# Patient Record
Sex: Male | Born: 1945
Health system: Southern US, Community
[De-identification: ages and names within clinical notes are randomized; demographics above are authoritative.]

## PROBLEM LIST (undated history)

## (undated) DIAGNOSIS — I309 Acute pericarditis, unspecified: Secondary | ICD-10-CM

## (undated) DIAGNOSIS — I2510 Atherosclerotic heart disease of native coronary artery without angina pectoris: Secondary | ICD-10-CM

## (undated) DIAGNOSIS — E785 Hyperlipidemia, unspecified: Secondary | ICD-10-CM

## (undated) DIAGNOSIS — K635 Polyp of colon: Secondary | ICD-10-CM

## (undated) HISTORY — DX: Polyp of colon: K63.5

## (undated) HISTORY — DX: Hyperlipidemia, unspecified: E78.5

## (undated) HISTORY — DX: Gilbert syndrome: E80.4

## (undated) HISTORY — PX: OTHER SURGICAL HISTORY: SHX169

---

## 1982-10-29 HISTORY — PX: KNEE ARTHROSCOPY: SUR90

## 1997-10-29 HISTORY — PX: FLEXIBLE SIGMOIDOSCOPY: SHX1649

## 1999-03-24 ENCOUNTER — Encounter: Payer: Self-pay | Admitting: Internal Medicine

## 1999-03-24 ENCOUNTER — Ambulatory Visit (HOSPITAL_COMMUNITY): Admission: RE | Admit: 1999-03-24 | Discharge: 1999-03-24 | Payer: Self-pay | Admitting: Internal Medicine

## 2001-03-05 ENCOUNTER — Emergency Department (HOSPITAL_COMMUNITY): Admission: EM | Admit: 2001-03-05 | Discharge: 2001-03-05 | Payer: Self-pay | Admitting: Emergency Medicine

## 2001-12-22 ENCOUNTER — Encounter: Payer: Self-pay | Admitting: Internal Medicine

## 2001-12-22 ENCOUNTER — Encounter: Admission: RE | Admit: 2001-12-22 | Discharge: 2001-12-22 | Payer: Self-pay | Admitting: Internal Medicine

## 2002-10-29 HISTORY — PX: COLONOSCOPY W/ POLYPECTOMY: SHX1380

## 2003-10-13 ENCOUNTER — Encounter: Payer: Self-pay | Admitting: Internal Medicine

## 2004-09-01 ENCOUNTER — Ambulatory Visit: Payer: Self-pay | Admitting: Internal Medicine

## 2005-01-11 ENCOUNTER — Ambulatory Visit: Payer: Self-pay | Admitting: Internal Medicine

## 2005-04-09 ENCOUNTER — Ambulatory Visit: Payer: Self-pay | Admitting: Internal Medicine

## 2005-07-27 ENCOUNTER — Ambulatory Visit: Payer: Self-pay | Admitting: Internal Medicine

## 2006-04-05 ENCOUNTER — Ambulatory Visit: Payer: Self-pay | Admitting: Internal Medicine

## 2006-06-10 ENCOUNTER — Ambulatory Visit: Payer: Self-pay | Admitting: Internal Medicine

## 2006-08-01 ENCOUNTER — Ambulatory Visit: Payer: Self-pay | Admitting: Internal Medicine

## 2007-05-22 ENCOUNTER — Ambulatory Visit: Payer: Self-pay | Admitting: Internal Medicine

## 2007-08-31 ENCOUNTER — Encounter: Payer: Self-pay | Admitting: Internal Medicine

## 2007-09-01 ENCOUNTER — Ambulatory Visit: Payer: Self-pay | Admitting: Internal Medicine

## 2007-09-01 DIAGNOSIS — N400 Enlarged prostate without lower urinary tract symptoms: Secondary | ICD-10-CM | POA: Insufficient documentation

## 2007-09-01 DIAGNOSIS — E785 Hyperlipidemia, unspecified: Secondary | ICD-10-CM | POA: Insufficient documentation

## 2007-09-01 LAB — CONVERTED CEMR LAB
Cholesterol, target level: 200 mg/dL
HDL goal, serum: 40 mg/dL
LDL Goal: 160 mg/dL

## 2007-09-09 ENCOUNTER — Encounter (INDEPENDENT_AMBULATORY_CARE_PROVIDER_SITE_OTHER): Payer: Self-pay | Admitting: *Deleted

## 2007-09-09 LAB — CONVERTED CEMR LAB
ALT: 27 units/L (ref 0–53)
AST: 34 units/L (ref 0–37)
Albumin: 4.3 g/dL (ref 3.5–5.2)
Alkaline Phosphatase: 65 units/L (ref 39–117)
BUN: 18 mg/dL (ref 6–23)
Basophils Absolute: 0 10*3/uL (ref 0.0–0.1)
Basophils Relative: 0.9 % (ref 0.0–1.0)
Bilirubin, Direct: 0.2 mg/dL (ref 0.0–0.3)
CO2: 31 meq/L (ref 19–32)
Calcium: 9 mg/dL (ref 8.4–10.5)
Chloride: 104 meq/L (ref 96–112)
Cholesterol: 184 mg/dL (ref 0–200)
Creatinine, Ser: 1.1 mg/dL (ref 0.4–1.5)
Eosinophils Absolute: 0.1 10*3/uL (ref 0.0–0.6)
Eosinophils Relative: 2.5 % (ref 0.0–5.0)
GFR calc Af Amer: 88 mL/min
GFR calc non Af Amer: 73 mL/min
Glucose, Bld: 98 mg/dL (ref 70–99)
HCT: 41.8 % (ref 39.0–52.0)
HDL: 42.7 mg/dL (ref >39.0)
Hemoglobin: 14.4 g/dL (ref 13.0–17.0)
LDL Cholesterol: 123 mg/dL — ABNORMAL HIGH (ref 0–99)
Lymphocytes Relative: 24 % (ref 12.0–46.0)
MCHC: 34.5 g/dL (ref 30.0–36.0)
MCV: 93.9 fL (ref 78.0–100.0)
Monocytes Absolute: 0.5 10*3/uL (ref 0.2–0.7)
Monocytes Relative: 9.1 % (ref 3.0–11.0)
Neutro Abs: 3.6 10*3/uL (ref 1.4–7.7)
Neutrophils Relative %: 63.5 % (ref 43.0–77.0)
PSA: 0.51 ng/mL (ref 0.10–4.00)
Platelets: 242 10*3/uL (ref 150–400)
Potassium: 4.8 meq/L (ref 3.5–5.1)
RBC: 4.45 M/uL (ref 4.22–5.81)
RDW: 12.9 % (ref 11.5–14.6)
Sodium: 141 meq/L (ref 135–145)
TSH: 1.64 microintl units/mL (ref 0.35–5.50)
Total Bilirubin: 2 mg/dL — ABNORMAL HIGH (ref 0.3–1.2)
Total CHOL/HDL Ratio: 4.3
Total Protein: 7.3 g/dL (ref 6.0–8.3)
Triglycerides: 92 mg/dL (ref 0–149)
VLDL: 18 mg/dL (ref 0–40)
WBC: 5.5 10*3/uL (ref 4.5–10.5)

## 2007-09-15 ENCOUNTER — Ambulatory Visit: Payer: Self-pay | Admitting: Internal Medicine

## 2007-09-15 ENCOUNTER — Encounter (INDEPENDENT_AMBULATORY_CARE_PROVIDER_SITE_OTHER): Payer: Self-pay | Admitting: *Deleted

## 2007-12-30 ENCOUNTER — Ambulatory Visit: Payer: Self-pay | Admitting: Internal Medicine

## 2008-01-13 ENCOUNTER — Encounter (INDEPENDENT_AMBULATORY_CARE_PROVIDER_SITE_OTHER): Payer: Self-pay | Admitting: *Deleted

## 2008-01-13 LAB — CONVERTED CEMR LAB
ALT: 32 units/L (ref 0–53)
AST: 38 units/L — ABNORMAL HIGH (ref 0–37)
Albumin: 4 g/dL (ref 3.5–5.2)
Alkaline Phosphatase: 42 units/L (ref 39–117)
Bilirubin, Direct: 0.3 mg/dL (ref 0.0–0.3)
Cholesterol: 148 mg/dL (ref 0–200)
HDL: 44.5 mg/dL (ref >39.0)
LDL Cholesterol: 85 mg/dL (ref 0–99)
Total Bilirubin: 2.1 mg/dL — ABNORMAL HIGH (ref 0.3–1.2)
Total CHOL/HDL Ratio: 3.3
Total Protein: 6.8 g/dL (ref 6.0–8.3)
Triglycerides: 92 mg/dL (ref 0–149)
VLDL: 18 mg/dL (ref 0–40)

## 2008-01-19 ENCOUNTER — Telehealth (INDEPENDENT_AMBULATORY_CARE_PROVIDER_SITE_OTHER): Payer: Self-pay | Admitting: *Deleted

## 2008-06-08 ENCOUNTER — Telehealth (INDEPENDENT_AMBULATORY_CARE_PROVIDER_SITE_OTHER): Payer: Self-pay | Admitting: *Deleted

## 2008-07-14 ENCOUNTER — Ambulatory Visit: Payer: Self-pay | Admitting: Internal Medicine

## 2008-07-15 ENCOUNTER — Encounter: Payer: Self-pay | Admitting: Internal Medicine

## 2008-07-16 ENCOUNTER — Encounter (INDEPENDENT_AMBULATORY_CARE_PROVIDER_SITE_OTHER): Payer: Self-pay | Admitting: *Deleted

## 2008-07-16 LAB — CONVERTED CEMR LAB
ALT: 31 units/L (ref 0–53)
AST: 37 units/L (ref 0–37)
Albumin: 4.3 g/dL (ref 3.5–5.2)
Alkaline Phosphatase: 52 units/L (ref 39–117)
BUN: 16 mg/dL (ref 6–23)
Basophils Absolute: 0 10*3/uL (ref 0.0–0.1)
Basophils Relative: 0.5 % (ref 0.0–3.0)
Bilirubin, Direct: 0.3 mg/dL (ref 0.0–0.3)
CO2: 30 meq/L (ref 19–32)
Calcium: 9.2 mg/dL (ref 8.4–10.5)
Chloride: 108 meq/L (ref 96–112)
Cholesterol: 128 mg/dL (ref 0–200)
Creatinine, Ser: 1.2 mg/dL (ref 0.4–1.5)
Eosinophils Absolute: 0.1 10*3/uL (ref 0.0–0.7)
Eosinophils Relative: 2.5 % (ref 0.0–5.0)
GFR calc Af Amer: 79 mL/min
GFR calc non Af Amer: 66 mL/min
Glucose, Bld: 98 mg/dL (ref 70–99)
HCT: 41.8 % (ref 39.0–52.0)
HDL: 40.4 mg/dL (ref >39.0)
Hemoglobin: 14.2 g/dL (ref 13.0–17.0)
LDL Cholesterol: 69 mg/dL (ref 0–99)
Lymphocytes Relative: 25.2 % (ref 12.0–46.0)
MCHC: 34 g/dL (ref 30.0–36.0)
MCV: 93.8 fL (ref 78.0–100.0)
Monocytes Absolute: 0.6 10*3/uL (ref 0.1–1.0)
Monocytes Relative: 11.5 % (ref 3.0–12.0)
Neutro Abs: 3.4 10*3/uL (ref 1.4–7.7)
Neutrophils Relative %: 60.3 % (ref 43.0–77.0)
PSA: 0.41 ng/mL (ref 0.10–4.00)
Platelets: 246 10*3/uL (ref 150–400)
Potassium: 5.2 meq/L — ABNORMAL HIGH (ref 3.5–5.1)
RBC: 4.45 M/uL (ref 4.22–5.81)
RDW: 12.6 % (ref 11.5–14.6)
Sodium: 140 meq/L (ref 135–145)
TSH: 1.65 microintl units/mL (ref 0.35–5.50)
Total Bilirubin: 1.9 mg/dL — ABNORMAL HIGH (ref 0.3–1.2)
Total CHOL/HDL Ratio: 3.2
Total Protein: 7.1 g/dL (ref 6.0–8.3)
Triglycerides: 93 mg/dL (ref 0–149)
VLDL: 19 mg/dL (ref 0–40)
WBC: 5.5 10*3/uL (ref 4.5–10.5)

## 2008-07-26 ENCOUNTER — Ambulatory Visit: Payer: Self-pay | Admitting: Internal Medicine

## 2008-07-26 LAB — CONVERTED CEMR LAB
OCCULT 1: NEGATIVE
OCCULT 2: NEGATIVE
OCCULT 3: NEGATIVE

## 2008-07-27 ENCOUNTER — Encounter (INDEPENDENT_AMBULATORY_CARE_PROVIDER_SITE_OTHER): Payer: Self-pay | Admitting: *Deleted

## 2008-08-24 ENCOUNTER — Ambulatory Visit: Payer: Self-pay | Admitting: Internal Medicine

## 2008-08-24 ENCOUNTER — Telehealth (INDEPENDENT_AMBULATORY_CARE_PROVIDER_SITE_OTHER): Payer: Self-pay | Admitting: *Deleted

## 2008-09-09 ENCOUNTER — Ambulatory Visit: Payer: Self-pay | Admitting: Internal Medicine

## 2008-09-09 DIAGNOSIS — K635 Polyp of colon: Secondary | ICD-10-CM | POA: Insufficient documentation

## 2009-09-12 ENCOUNTER — Ambulatory Visit: Payer: Self-pay | Admitting: Internal Medicine

## 2009-09-12 DIAGNOSIS — G479 Sleep disorder, unspecified: Secondary | ICD-10-CM | POA: Insufficient documentation

## 2009-09-26 ENCOUNTER — Telehealth (INDEPENDENT_AMBULATORY_CARE_PROVIDER_SITE_OTHER): Payer: Self-pay | Admitting: *Deleted

## 2010-06-27 ENCOUNTER — Telehealth (INDEPENDENT_AMBULATORY_CARE_PROVIDER_SITE_OTHER): Payer: Self-pay | Admitting: *Deleted

## 2010-09-06 ENCOUNTER — Ambulatory Visit: Payer: Self-pay | Admitting: Internal Medicine

## 2010-09-10 LAB — CONVERTED CEMR LAB
ALT: 25 units/L (ref 0–53)
AST: 32 units/L (ref 0–37)
Albumin: 4 g/dL (ref 3.5–5.2)
Alkaline Phosphatase: 47 units/L (ref 39–117)
BUN: 17 mg/dL (ref 6–23)
Basophils Absolute: 0.1 10*3/uL (ref 0.0–0.1)
Basophils Relative: 0.9 % (ref 0.0–3.0)
Bilirubin, Direct: 0.2 mg/dL (ref 0.0–0.3)
CO2: 29 meq/L (ref 19–32)
Calcium: 9.1 mg/dL (ref 8.4–10.5)
Chloride: 105 meq/L (ref 96–112)
Cholesterol: 147 mg/dL (ref 0–200)
Creatinine, Ser: 1.1 mg/dL (ref 0.4–1.5)
Eosinophils Absolute: 0.2 10*3/uL (ref 0.0–0.7)
Eosinophils Relative: 2.9 % (ref 0.0–5.0)
GFR calc non Af Amer: 75.09 mL/min (ref >60)
Glucose, Bld: 93 mg/dL (ref 70–99)
HCT: 40.5 % (ref 39.0–52.0)
HDL: 44.5 mg/dL (ref >39.00)
Hemoglobin: 13.9 g/dL (ref 13.0–17.0)
LDL Cholesterol: 87 mg/dL (ref 0–99)
Lymphocytes Relative: 26.9 % (ref 12.0–46.0)
Lymphs Abs: 1.6 10*3/uL (ref 0.7–4.0)
MCHC: 34.4 g/dL (ref 30.0–36.0)
MCV: 94.1 fL (ref 78.0–100.0)
Monocytes Absolute: 0.5 10*3/uL (ref 0.1–1.0)
Monocytes Relative: 8.7 % (ref 3.0–12.0)
Neutro Abs: 3.7 10*3/uL (ref 1.4–7.7)
Neutrophils Relative %: 60.6 % (ref 43.0–77.0)
PSA: 0.56 ng/mL (ref 0.10–4.00)
Platelets: 236 10*3/uL (ref 150.0–400.0)
Potassium: 4.4 meq/L (ref 3.5–5.1)
RBC: 4.31 M/uL (ref 4.22–5.81)
RDW: 13.7 % (ref 11.5–14.6)
Sodium: 140 meq/L (ref 135–145)
TSH: 1.83 microintl units/mL (ref 0.35–5.50)
Total Bilirubin: 1.7 mg/dL — ABNORMAL HIGH (ref 0.3–1.2)
Total CHOL/HDL Ratio: 3
Total Protein: 6.2 g/dL (ref 6.0–8.3)
Triglycerides: 79 mg/dL (ref 0.0–149.0)
VLDL: 15.8 mg/dL (ref 0.0–40.0)
WBC: 6 10*3/uL (ref 4.5–10.5)

## 2010-09-12 ENCOUNTER — Encounter: Payer: Self-pay | Admitting: Internal Medicine

## 2010-09-12 ENCOUNTER — Ambulatory Visit: Payer: Self-pay | Admitting: Internal Medicine

## 2010-09-25 ENCOUNTER — Ambulatory Visit: Payer: Self-pay | Admitting: Internal Medicine

## 2010-09-25 LAB — CONVERTED CEMR LAB
OCCULT 1: NEGATIVE
OCCULT 2: NEGATIVE
OCCULT 3: NEGATIVE

## 2010-09-26 ENCOUNTER — Encounter (INDEPENDENT_AMBULATORY_CARE_PROVIDER_SITE_OTHER): Payer: Self-pay | Admitting: *Deleted

## 2010-11-26 LAB — CONVERTED CEMR LAB
ALT: 28 units/L (ref 0–53)
AST: 35 units/L (ref 0–37)
Albumin: 4.3 g/dL (ref 3.5–5.2)
Alkaline Phosphatase: 47 units/L (ref 39–117)
BUN: 14 mg/dL (ref 6–23)
Basophils Absolute: 0.1 10*3/uL (ref 0.0–0.1)
Basophils Relative: 0.9 % (ref 0.0–3.0)
Bilirubin, Direct: 0.3 mg/dL (ref 0.0–0.3)
CO2: 30 meq/L (ref 19–32)
Calcium: 8.9 mg/dL (ref 8.4–10.5)
Chloride: 101 meq/L (ref 96–112)
Cholesterol, target level: 200 mg/dL
Cholesterol: 160 mg/dL (ref 0–200)
Creatinine, Ser: 1.1 mg/dL (ref 0.4–1.5)
Eosinophils Absolute: 0.1 10*3/uL (ref 0.0–0.7)
Eosinophils Relative: 1.4 % (ref 0.0–5.0)
GFR calc non Af Amer: 72.18 mL/min (ref >60)
Glucose, Bld: 99 mg/dL (ref 70–99)
HCT: 43.7 % (ref 39.0–52.0)
HDL goal, serum: 40 mg/dL
HDL: 47.7 mg/dL (ref >39.00)
Hemoglobin: 14.6 g/dL (ref 13.0–17.0)
LDL Cholesterol: 101 mg/dL — ABNORMAL HIGH (ref 0–99)
LDL Goal: 100 mg/dL
Lymphocytes Relative: 24.5 % (ref 12.0–46.0)
Lymphs Abs: 1.4 10*3/uL (ref 0.7–4.0)
MCHC: 33.4 g/dL (ref 30.0–36.0)
MCV: 96.1 fL (ref 78.0–100.0)
Monocytes Absolute: 0.5 10*3/uL (ref 0.1–1.0)
Monocytes Relative: 9.1 % (ref 3.0–12.0)
Neutro Abs: 3.6 10*3/uL (ref 1.4–7.7)
Neutrophils Relative %: 64.1 % (ref 43.0–77.0)
PSA: 0.56 ng/mL (ref 0.10–4.00)
Platelets: 245 10*3/uL (ref 150.0–400.0)
Potassium: 4.7 meq/L (ref 3.5–5.1)
RBC: 4.54 M/uL (ref 4.22–5.81)
RDW: 12.6 % (ref 11.5–14.6)
Sodium: 138 meq/L (ref 135–145)
TSH: 1.59 microintl units/mL (ref 0.35–5.50)
Total Bilirubin: 2 mg/dL — ABNORMAL HIGH (ref 0.3–1.2)
Total CHOL/HDL Ratio: 3
Total Protein: 7.4 g/dL (ref 6.0–8.3)
Triglycerides: 59 mg/dL (ref 0.0–149.0)
VLDL: 11.8 mg/dL (ref 0.0–40.0)
WBC: 5.7 10*3/uL (ref 4.5–10.5)

## 2010-11-28 NOTE — Letter (Signed)
Summary: Results Follow up Letter  [facility] [place]  [address] [place], [place] [address]   Phone: [phone]  Fax: [phone]    09/26/2010 MRN: [id number]  Trevor [name] [address] [place], [place]  [address]  Dear [name],  The following are the results of your recent test(s):  Test         Result    Pap Smear:        Normal _____  Not Normal _____ Comments: ______________________________________________________ Cholesterol: LDL(Bad cholesterol):         Your goal is less than:         HDL (Good cholesterol):       Your goal is more than: Comments:  ______________________________________________________ Mammogram:        Normal _____  Not Normal _____ Comments:  ___________________________________________________________________ Hemoccult:        Normal __X___  Not normal _______ Comments:    _____________________________________________________________________ Other Tests:    We routinely do not discuss normal results over the telephone.  If you desire a copy of the results, or you have any questions about this information we can discuss them at your next office visit.   Sincerely,

## 2010-11-28 NOTE — Assessment & Plan Note (Signed)
Summary: cpx/cbs   Vital Signs:  Patient profile:   65 year old male Height:      67 inches (170.18 cm) Weight:      169.38 pounds (76.99 kg) BMI:     26.62 Temp:     97.6 degrees F (36.44 degrees C) oral Resp:     14 per minute BP sitting:   136 / 80  (left arm)  Vitals Entered By: Lucious Groves CMA (September 12, 2010 1:02 PM)  Is Patient Diabetic? No Pain Assessment Patient in pain? no      Comments Patient needs to discuss flu and zosta vacc./kb   History of Present Illness:         Trevor [name] is here for a physical; he has been awakening early for 6 months.  Hyperlipidemia Follow-Up        The patient reports fatigue, but denies muscle aches, GI upset, abdominal pain, flushing, itching, constipation, and diarrhea.  The patient denies the following symptoms: chest pain/pressure, exercise intolerance, dypsnea, palpitations, syncope, and pedal edema.  Compliance with medications (by patient report) has been near 100%.  Dietary compliance has been fair.  The patient reports exercising daily for 30-45 minutes.  Adjunctive measures currently used by the patient include ASA and fish oil supplements.    Lipid Management History:      Positive NCEP/ATP III risk factors include male age 31 years old or older.  Negative NCEP/ATP III risk factors include non-diabetic, no family history for ischemic heart disease, non-tobacco-user status, non-hypertensive, no ASHD (atherosclerotic heart disease), no prior stroke/TIA, no peripheral vascular disease, and no history of aortic aneurysm.     Current Medications (verified): 1)  Crestor 5 Mg  Tabs (Rosuvastatin Calcium) .... 1/2 By Mouth Once Daily 2)  Vit B 3)  Asa 81 Mg 4)  Vit C 5)  Multivitamin 6)  Fish Oil .... As Directed  Allergies (verified): 1)  ! Niacin 2)  Codeine  Past History:  Past Medical History: Hyperlipidemia: [place] Study LDL goal = < 160. NMR Lipoprofile 2004: LDL 156( 1799/ 916)HDL 39, TG 134. LDL goal= < 120, ideally  < 90. Gilbert's Syndrome ; Lymphocytic  Spongiotic Dermatitis; Actinic Keratoses, Dr Campbell Stall; Restless Leg Syndrome Colonic polyps, PMH  of , [facility] ER visit  2002 palpitations.  Past Surgical History: Arthroscopy  R knee Colon polypectomy @ age  72, Dr Kinnie Scales Actinic Keratoses, facial  removed X 2, Dr Danella Deis  Family History: Father: died  51  of CVA, alcohol & tobacco abuse Mother:  breast cancer  Siblings: bro :died  HIV; bro: prostate  cancer  @ 3; M aunt: breast cancer; MGM: breast cancer   Social History: no diet Married Alcohol use-no Regular exercise-yes Former Smoker:quit 1980  Review of Systems  The patient denies anorexia, fever, decreased hearing, hoarseness, prolonged cough, headaches, hemoptysis, abdominal pain, melena, hematochezia, severe indigestion/heartburn, hematuria, suspicious skin lesions, depression, unusual weight change, abnormal bleeding, enlarged lymph nodes, and angioedema.         Weight up 5 #.  Physical Exam  General:  well-nourished;alert,appropriate and cooperative throughout examination Head:  Normocephalic and atraumatic without obvious abnormalities. No apparent alopecia  Eyes:  No corneal or conjunctival inflammation noted. Perrla. Funduscopic exam benign, without hemorrhages, exudates or papilledema.  Ears:  External ear exam shows no significant lesions or deformities.  Otoscopic examination reveals clear canals, tympanic membranes are intact bilaterally without bulging, retraction, inflammation or discharge. Hearing is grossly normal bilaterally. Some wax bilaterally Nose:  External nasal examination shows no deformity or inflammation. Nasal mucosa are pink and moist without lesions or exudates. Mouth:  Oral mucosa and oropharynx without lesions or exudates.  Teeth in good repair. Neck:  No deformities, masses, or tenderness noted. Lungs:  Normal respiratory effort, chest expands symmetrically. Lungs are clear to auscultation, no  crackles or wheezes. Heart:  Normal rate and regular rhythm. S1 and S2 normal without gallop, murmur, click, rub.S 4 Abdomen:  Bowel sounds positive,abdomen soft and non-tender without masses, organomegaly or hernias noted. Rectal:  No external abnormalities noted. Normal sphincter tone. No rectal masses or tenderness. Genitalia:  Testes bilaterally descended without nodularity, tenderness or masses. No scrotal masses or lesions. No penis lesions or urethral discharge. Prostate:  no nodules, no asymmetry, no induration,  but  1.5 + enlarged.   Msk:  No deformity or scoliosis noted of thoracic or lumbar spine.   Pulses:  R and L carotid,radial,dorsalis pedis and posterior tibial pulses are full and equal bilaterally Extremities:  No clubbing, cyanosis, edema, or deformity noted with normal full range of motion of all joints.   Neurologic:  alert & oriented X3 and DTRs symmetrical and normal.   Skin:  Intact without suspicious lesions or rashes Cervical Nodes:  No lymphadenopathy noted Axillary Nodes:  No palpable lymphadenopathy Psych:  memory intact for recent and remote, normally interactive, and good eye contact.     Impression & Recommendations:  Problem # 1:  ROUTINE GENERAL MEDICAL EXAM@HEALTH  CARE FACL (ICD-V70.0)  Orders: EKG w/ Interpretation (93000)  Problem # 2:  HYPERLIPIDEMIA (ICD-272.4)  His updated medication list for this problem includes:    Crestor 5 Mg Tabs (Rosuvastatin calcium) .Marland Kitchen... Daily as directed  Problem # 3:  HYPERPLASIA PROSTATE UNS W/O UR OBST & OTH LUTS (ICD-600.90)  Problem # 4:  COLONIC POLYPS, HX OF (ICD-V12.72) as per Dr Kinnie Scales  Complete Medication List: 1)  Crestor 5 Mg Tabs (Rosuvastatin calcium) .... Daily as directed 2)  Vit B  3)  Asa 81 Mg  4)  Vit C  5)  Multivitamin  6)  Fish Oil  .... As directed 7)  Lorazepam 0.5 Mg Tabs (Lorazepam) .Marland Kitchen.. 1 by mouth every 8-12 hours as needed  Lipid Assessment/Plan:      Based on NCEP/ATP III, the  patient's risk factor category is "0-1 risk factors".  The patient's lipid goals are as follows: Total cholesterol goal is 200; LDL cholesterol goal is 100; HDL cholesterol goal is 40; Triglyceride goal is 150.  His LDL cholesterol goal has been met.    Patient Instructions: 1)  Crestor 5 mg 1/2 pill M, W,F & Sun. 2)  It is important that you exercise regularly at least 20 minutes 5 times a week. If you develop chest pain, have severe difficulty breathing, or feel very tired , stop exercising immediately and seek medical attention. 3)  Take an  81 mg coated Aspirin every day. Prescriptions: LORAZEPAM 0.5 MG TABS (LORAZEPAM) 1 by mouth every 8-12 hours as needed  #60 x 0   Entered by:   Shonna Chock CMA   Authorized by:   Marga Melnick MD   Signed by:   Marga Melnick MD on 09/12/2010   Method used:   Historical   RxID:   [id number] CRESTOR 5 MG  TABS (ROSUVASTATIN CALCIUM) daily as directed  #30 x 5   Entered and Authorized by:   Marga Melnick MD   Signed by:   Marga Melnick MD on 09/12/2010  Method used:   Print then Give to Patient   RxID:   [id number]    Orders Added: 1)  Est. Patient 40-64 years [99396] 2)  EKG w/ Interpretation [93000]

## 2010-11-28 NOTE — Progress Notes (Signed)
Summary: lab order  Phone Note Call from Patient   Caller: Patient Summary of Call: patient has cpx scheduled [id number] - lab scheduled [phone] - need lab order  Initial call taken by: Okey Regal Spring,  June 27, 2010 11:37 AM  Follow-up for Phone Call        TLB-Lipid Panel (80061-LIPID) TLB-BMP (Basic Metabolic Panel-BMET) (80048-METABOL) TLB-CBC Platelet - w/Differential (85025-CBCD) TLB-Hepatic/Liver Function Pnl (80076-HEPATIC) TLB-TSH (Thyroid Stimulating Hormone) (84443-TSH) TLB-PSA (Prostate Specific Antigen) (84153-PSA) Stool Cards and Udip v70.0/272.4/995.20 Follow-up by: Shonna Chock CMA,  June 27, 2010 11:58 AM  Additional Follow-up for Phone Call Additional follow up Details #1::        lab order added to appt .Marland KitchenOkey Regal Spring  June 27, 2010 2:28 PM

## 2011-02-12 ENCOUNTER — Other Ambulatory Visit: Payer: Self-pay | Admitting: Internal Medicine

## 2011-03-16 NOTE — Consult Note (Signed)
[place]  Patient:    Trevor [name], PERROTT                    MRN: [id number] Adm. Date:  [id number] Attending:  Osvaldo Human CC:         Hope M. Danella Deis, M.D.   Consultation Report  HISTORY OF PRESENT ILLNESS:  Gaetana Michaelis is a 65 year old Caucasian male who presents with tachycardia.  He has had this, the fifth episode, over the last three months, all in the setting of yard work.  He will work in the yard and seemingly become overheated and feel stressed.  This is associated with flushing and profuse diaphoresis but no nausea.  He also denies headache or diarrhea with this or chest pain.  He treats this with cold packs and rest and the symptoms resolve within three-quarters of an hour.  The symptoms can be initiated by 15 minutes of yard work.  His wife states that she has noted his pulse up to 150 or greater.  PAST MEDICAL HISTORY:  Arthroscopy.  MEDICATIONS:  He is on no medicines.  ALLERGIES:  CODEINE causes nausea.  SOCIAL HISTORY:  He does not smoke or drink.  [place] a six-month period[place] [facility] [place] 30 minutes at 3.9 miles per hour and 0 incline without cardiopulmonary symptoms.  He has not done this recently, as he has recently developed a rash that was possibly felt to be related to a [place] dermatitis [place] [employer].  FAMILY HISTORY:  Stroke in his father; his father also had hypertension.  A paternal aunt had stroke.  REVIEW OF SYSTEMS:  Negative stress EKG in 1998 done as a routine study at the time of a physical.  His wife questions whether he could be having panic attacks but he has had no episodes other than related to the work in the yard. He has had increased work stresses and has been traveling extensively by air. His rash was treated with a combination of a prescription steroid and Eucerin with improvement but unfortunately has continued to return and he has an appointment to see Dr.  Danella Deis.  PHYSICAL EXAMINATION:  GENERAL:  He is comfortable at this time.  VITAL SIGNS:  Pulse 67 with normal sinus rhythm on telemetry.  Respiratory rate is 17 and unlabored.  He is normotensive.  HEENT:  Fundal exam is unremarkable.  ______:  Unremarkable.  NECK:  He has no thyroid palpable abnormalities.  No carotid bruits are noted.  LYMPH NODES:  He has no lymphadenopathy about the head, neck, or axillae.  CHEST:  Clear to auscultation.  CARDIOVASCULAR:  An S4 is noted.  I cannot appreciate murmurs or particularly mitral valve prolapse.  ABDOMEN:  He has no organomegaly or masses.  GENITALIA/RECTUM:  Exams initially deferred.  EXTREMITIES:  All pulses are intact.  He has no edema.  NEUROLOGIC/PSYCHIATRIC:  Negative.  SKIN:  He has scattered small papules 0.5-1 cm in diameter of the trunk. There is significant deformity of the right great toe.  LABORATORY DATA:  EKG is normal.  ASSESSMENT:  The history suggests paroxysmal atrial tachycardia in the setting of yard work, which raises the possibility of electrolyte imbalance.  A CPK, MB, and troponin will be collected, along with a renal profile, magnesium, and calcium and a full thyroid profile.  At the time of his physical, there was slight decrease in hematocrit; followup CBC was normal. To rule out anemia  as playing a role here, CBC will be repeated.  For the rash, he will continue the topical agents until he is seen by Dr. Danella Deis within the next seven days.  If the enzymes and electrolytes are negative, he will return home.  He will be told not to work in the yard, as this is the only trigger for the event.  He will have a stress Cardiolite as an outpatient.  If this reveals no dysrhythmias at high-exercise level and no evidence of perfusion defect, then an event monitor could be employed while he is performing the yard work.  DD: 03/05/01 TD:  03/06/01 Job: [id number]

## 2011-04-21 ENCOUNTER — Encounter: Payer: Self-pay | Admitting: Internal Medicine

## 2011-04-24 ENCOUNTER — Ambulatory Visit (INDEPENDENT_AMBULATORY_CARE_PROVIDER_SITE_OTHER): Payer: PRIVATE HEALTH INSURANCE | Admitting: Internal Medicine

## 2011-04-24 ENCOUNTER — Encounter: Payer: Self-pay | Admitting: Internal Medicine

## 2011-04-24 VITALS — BP 118/80 | HR 71 | Temp 98.2°F | Wt 168.0 lb

## 2011-04-24 DIAGNOSIS — R05 Cough: Secondary | ICD-10-CM

## 2011-04-24 DIAGNOSIS — K219 Gastro-esophageal reflux disease without esophagitis: Secondary | ICD-10-CM

## 2011-04-24 DIAGNOSIS — R1310 Dysphagia, unspecified: Secondary | ICD-10-CM

## 2011-04-24 DIAGNOSIS — R059 Cough, unspecified: Secondary | ICD-10-CM

## 2011-04-24 MED ORDER — ALBUTEROL SULFATE HFA 108 (90 BASE) MCG/ACT IN AERS: 2.0000 | INHALATION_SPRAY | Freq: Four times a day (QID) | RESPIRATORY_TRACT | Status: DC | PRN

## 2011-04-24 MED ORDER — OMEPRAZOLE 20 MG PO CPDR: 20.0000 mg | DELAYED_RELEASE_CAPSULE | Freq: Two times a day (BID) | ORAL | Status: DC

## 2011-04-24 MED ORDER — FLUTICASONE-SALMETEROL 250-50 MCG/DOSE IN AEPB: 1.0000 | INHALATION_SPRAY | Freq: Two times a day (BID) | RESPIRATORY_TRACT | Status: DC

## 2011-04-24 NOTE — Patient Instructions (Signed)
The triggers for dyspepsia or "heart burn"  include stress; the "aspirin family" ; alcohol; peppermint; and caffeine (coffee, tea, cola, and chocolate). The aspirin family would include aspirin and the nonsteroidal agents such as ibuprofen &  Naproxen. Tylenol would not cause reflux. If having dyspepsia ; food & dink should be avoided for @ least 2 hors before going to bed.  GI referral if dysphagia persists. PFTs if cough persists.

## 2011-04-24 NOTE — Progress Notes (Signed)
  Subjective:    Patient ID: Trevor [name], male    DOB: 08-15-1946, 65 y.o.   MRN: [id number]  HPI Respiratory tract infection Onset/symptoms:2 weeks ago as dry cough Exposures (illness/environmental/extrinsic):no Progression of symptoms:worsening Treatments/response:seen for progression @ UC; Rx: Prednisone, decongestant  & Zpack with response Present symptoms:cough recurred 6/25; actually he has had a cough when working  outside for past year Fever/chills/sweats:no Frontal headache:no Facial pain:no Nasal purulence:no Sore throat:yes Dental pain:no Lymphadenopathy:no Wheezing;no;shortness of breath:after cough Sputum/hemoptysis:no Pleuritic pain:no Associated extrinsic/allergic symptoms:itchy eyes/ sneezing:no Past medical history: Seasonal allergies:no;asthma:no Meds: no ACE-I GERD: yes ; intermittent food dysphagia, "2x/week" Smoking history:quit 1980 FH: bro  childhood asthma; mother S/P esophageal dilation           Review of Systems no melena, BRRB, unexplained weight loss     Objective:   Physical Exam General appearance is of good health and nourishment; no acute distress or increased work of breathing is present.  No  lymphadenopathy about the head, neck, or axilla noted.   Eyes: No conjunctival inflammation or lid edema is present. There is no scleral icterus.  Ears:  External ear exam shows no significant lesions or deformities.  Otoscopic examination reveals some wax. L TM  intact  without bulging, retraction, inflammation or discharge.  Nose:  External nasal examination shows no deformity or inflammation. Nasal mucosa are pink and moist without lesions or exudates. No septal dislocation or dislocation.No obstruction to airflow.   Oral exam: Dental hygiene is good; lips and gums are healthy appearing.There is no oropharyngeal erythema or exudate noted.   Neck:  No deformities, thyromegaly, masses, or tenderness noted.     Heart:  Normal rate and  regular rhythm. S1 and S2 normal without gallop, murmur, click, rub or other extra sounds.   Lungs:Chest clear to auscultation; no wheezes, rhonchi,rales ,or rubs present.No increased work of breathing.  Abdomen: bowel sounds normal, soft and non-tender without masses, organomegaly or hernias noted.  No guarding or rebound    Extremities:  No cyanosis, edema, or clubbing  noted    Skin: Warm & dry w/o jaundice or tenting.          Assessment & Plan:  #1 bronchitis, acute, resolved #2 cough, chronic recurrent X 12 months . R/O EIB &/or GERD.With the dysphagia the latter is more likely. Plan: see Orders

## 2011-05-14 ENCOUNTER — Telehealth: Payer: Self-pay | Admitting: Internal Medicine

## 2011-05-14 DIAGNOSIS — R059 Cough, unspecified: Secondary | ICD-10-CM

## 2011-05-14 DIAGNOSIS — R05 Cough: Secondary | ICD-10-CM

## 2011-05-14 NOTE — Telephone Encounter (Signed)
Pt called still has cough non productive and would like to know if there is anything else he cough do.

## 2011-05-15 NOTE — Telephone Encounter (Signed)
Per Dr. Alwyn Ren ok for tessalon 200 mg every 8 hours prn #21 along w/ order for chest xray and referral to pulmonary for PFT's  Left for pt to return call.

## 2011-05-15 NOTE — Progress Notes (Signed)
Addended byMarga Melnick F on: 05/15/2011 10:23 AM   Modules accepted: Orders

## 2011-05-16 NOTE — Telephone Encounter (Signed)
The studies would rule out any other active lung process and also assess for the presence of reactive airways disease or asthma as cause of this protracted cough. He may pick up a sample of Symbicort 160 to be used 1-2 puffs every 12 hours. He should gargle and spit after use.

## 2011-05-16 NOTE — Telephone Encounter (Signed)
Spoke w/ pt says he really feels like he needs an inhaler instead of cough med informed pt that he should Dr. Janae Bridgeman want to wait on results before prescribing inhaler but will forward information. Says he has more of wheezing feeling more than cough also let him this is why he will be going for orders placed. Please advise do you wish to prescribe inhaler.

## 2011-05-16 NOTE — Telephone Encounter (Signed)
Left msg on voicemail to return call.

## 2011-05-17 NOTE — Telephone Encounter (Signed)
Spoke w/ pt informed sample placed up front for pick

## 2011-05-21 ENCOUNTER — Encounter: Payer: Self-pay | Admitting: Internal Medicine

## 2011-05-21 ENCOUNTER — Ambulatory Visit (INDEPENDENT_AMBULATORY_CARE_PROVIDER_SITE_OTHER): Payer: PRIVATE HEALTH INSURANCE | Admitting: Internal Medicine

## 2011-05-21 ENCOUNTER — Ambulatory Visit (INDEPENDENT_AMBULATORY_CARE_PROVIDER_SITE_OTHER)
Admission: RE | Admit: 2011-05-21 | Discharge: 2011-05-21 | Disposition: A | Discharge status: Home / Self Care | Payer: PRIVATE HEALTH INSURANCE | Source: Ambulatory Visit | Attending: Internal Medicine | Admitting: Internal Medicine

## 2011-05-21 DIAGNOSIS — R05 Cough: Secondary | ICD-10-CM

## 2011-05-21 DIAGNOSIS — R059 Cough, unspecified: Secondary | ICD-10-CM

## 2011-05-21 DIAGNOSIS — R0989 Other specified symptoms and signs involving the circulatory and respiratory systems: Secondary | ICD-10-CM

## 2011-05-21 DIAGNOSIS — R0609 Other forms of dyspnea: Secondary | ICD-10-CM

## 2011-05-21 LAB — PULMONARY FUNCTION TEST

## 2011-05-21 NOTE — Progress Notes (Signed)
PFT done today. 

## 2011-08-13 ENCOUNTER — Ambulatory Visit (INDEPENDENT_AMBULATORY_CARE_PROVIDER_SITE_OTHER): Payer: PRIVATE HEALTH INSURANCE | Admitting: Internal Medicine

## 2011-08-13 ENCOUNTER — Encounter: Payer: Self-pay | Admitting: Internal Medicine

## 2011-08-13 VITALS — BP 126/80 | HR 65 | Temp 98.4°F | Resp 12 | Ht 67.25 in | Wt 166.8 lb

## 2011-08-13 DIAGNOSIS — E785 Hyperlipidemia, unspecified: Secondary | ICD-10-CM

## 2011-08-13 DIAGNOSIS — Z0000 Encounter for general adult medical examination without abnormal findings: Secondary | ICD-10-CM

## 2011-08-13 LAB — CBC WITH DIFFERENTIAL/PLATELET
Basophils Absolute: 0 10*3/uL (ref 0.0–0.1)
Eosinophils Relative: 0.9 % (ref 0.0–5.0)
Hemoglobin: 13.7 g/dL (ref 13.0–17.0)
Lymphocytes Relative: 29.7 % (ref 12.0–46.0)
Monocytes Relative: 7.9 % (ref 3.0–12.0)
Neutro Abs: 3.1 10*3/uL (ref 1.4–7.7)
RBC: 4.35 Mil/uL (ref 4.22–5.81)
RDW: 13.8 % (ref 11.5–14.6)
WBC: 5.2 10*3/uL (ref 4.5–10.5)

## 2011-08-13 LAB — BASIC METABOLIC PANEL
Calcium: 8.7 mg/dL (ref 8.4–10.5)
GFR: 79.16 mL/min (ref >60.00)
Glucose, Bld: 84 mg/dL (ref 70–99)
Potassium: 4.3 mEq/L (ref 3.5–5.1)
Sodium: 139 mEq/L (ref 135–145)

## 2011-08-13 LAB — LIPID PANEL
HDL: 50 mg/dL (ref >39.00)
LDL Cholesterol: 118 mg/dL — ABNORMAL HIGH (ref 0–99)
VLDL: 16 mg/dL (ref 0.0–40.0)

## 2011-08-13 LAB — HEPATIC FUNCTION PANEL
ALT: 22 U/L (ref 0–53)
AST: 29 U/L (ref 0–37)
Total Bilirubin: 1.9 mg/dL — ABNORMAL HIGH (ref 0.3–1.2)
Total Protein: 6.9 g/dL (ref 6.0–8.3)

## 2011-08-13 LAB — PSA: PSA: 0.61 ng/mL (ref 0.10–4.00)

## 2011-08-13 NOTE — Assessment & Plan Note (Signed)
On Crestor 5 mg 1/2 pill M, W, & Fri

## 2011-08-13 NOTE — Patient Instructions (Addendum)
Preventive Health Care: Exercise at least 30-45 minutes a day,  3-4 days a week.  Eat a low-fat diet with lots of fruits and vegetables, up to 7-9 servings per day. Consume less than 40 grams of sugar per day from foods & drinks with High Fructose Corn Sugar as #2,3 or # 4 on label.  Should wax build up occur, please put 2-3 drops of mineral oil in the ear at night and cover the canal with a  cotton ball.In the morning fill the canal with hydrogen peroxide & leave  for 10-15 minutes.Following this shower and use the thinnest washrag available to wick out the wax.  Eat a low-fat diet with lots of fruits and vegetables, up to 7-9 servings per day. Avoid obesity; your goal is waist measurement < 40 inches.Consume less than 40 grams of sugar per day from foods & drinks with High Fructose Corn Sugar as #1,2,3 or # 4 on label. Follow the low carb nutrition program in [employer] as closely as possible to prevent Diabetes progression & complications. White carbohydrates (potatoes, rice, bread, and pasta) have a high spike of sugar and a high load of sugar. For example a  baked potato has a cup of sugar and a  french fry  2 teaspoons of sugar. Yams, wild  rice, whole grained bread &  wheat pasta have been much lower spike and load of  sugar. Portions should be the size of a deck of cards or your palm.

## 2011-08-13 NOTE — Progress Notes (Signed)
Subjective:    Patient ID: Trevor [name], male    DOB: 24-Mar-1946, 65 y.o.   MRN: [id number]  HPI  Trevor [name]  is here for a physical; he has no acute issues but is seeking a defined nutritional plan.      Review of Systems Patient reports no  vision/ hearing changes,anorexia, weight change, fever ,adenopathy, persistant / recurrent hoarseness, swallowing issues, chest pain,palpitations, edema,persistant / recurrent cough, hemoptysis, dyspnea(rest, exertional, paroxysmal nocturnal), gastrointestinal  bleeding (melena, rectal bleeding), abdominal pain, excessive heart burn, GU symptoms( dysuria, hematuria, pyuria, voiding/incontinence  issues) syncope, focal weakness,significant  memory loss,numbness & tingling, skin/hair/nail changes,depression, anxiety,  abnormal bruising/bleeding,or  musculoskeletal symptoms/signs.  Exercise 4X/ week w/o symptoms.     Objective:   Physical Exam Gen.: Healthy and well-nourished in appearance. Alert, appropriate and cooperative throughout exam. Head: Normocephalic without obvious abnormalities;  no alopecia  Eyes: No corneal or conjunctival inflammation noted. Pupils equal round reactive to light and accommodation. Fundal exam is benign without hemorrhages, exudate, papilledema. Extraocular motion intact. Vision slightly blurred OS. Ears: External  ear exam reveals no significant lesions or deformities. Canals:wax bilaterally .TMs normal. Hearing is grossly normal bilaterally. Nose: External nasal exam reveals no deformity or inflammation. Nasal mucosa are pink and moist. No lesions or exudates noted.  Mouth: Oral mucosa and oropharynx reveal no lesions or exudates. Teeth in good repair. Neck: No deformities, masses, or tenderness noted. Range of motion essentially normal. Thyroid normal. Lungs: Normal respiratory effort; chest expands symmetrically. Lungs are clear to auscultation without rales, wheezes, or increased work of breathing. Heart: Normal  rate and rhythm. Normal S1 and S2. No gallop, click, or rub. S 4 w/o  murmur. Abdomen: Bowel sounds normal; abdomen soft and nontender. No masses, organomegaly or hernias noted. Genitalia/ DRE:    .                                                                                   Musculoskeletal/extremities: No deformity or scoliosis noted of  the thoracic or lumbar spine. No clubbing, cyanosis, edema, or deformity noted. Range of motion  normal .Tone & strength  normal.Joints normal. Nail health  good. Vascular: Carotid, radial artery, dorsalis pedis and  posterior tibial pulses are full and equal. No bruits present. Neurologic: Alert and oriented x3. Deep tendon reflexes symmetrical and normal.          Skin: Intact without suspicious lesions or rashes. Actinic changes Lymph: No cervical, axillary, or inguinal lymphadenopathy present. Psych: Mood and affect are normal. Normally interactive                                                                                        Assessment & Plan:  #1 comprehensive physical exam; no acute findings #2 see Problem List with Assessments & Recommendations Plan: see Orders

## 2011-08-27 ENCOUNTER — Telehealth: Payer: Self-pay | Admitting: Internal Medicine

## 2011-08-27 MED ORDER — LORAZEPAM 0.5 MG PO TABS: 0.5000 mg | ORAL_TABLET | Freq: Three times a day (TID) | ORAL | Status: DC | PRN

## 2011-08-27 MED ORDER — ROSUVASTATIN CALCIUM 5 MG PO TABS: ORAL_TABLET | ORAL | Status: DC

## 2011-08-27 NOTE — Telephone Encounter (Signed)
RX's sent, Dr.Hopper did not indicated patient should stop med, therefore patient is to continue. Left message on voicemail for patient to continue taking medication

## 2011-08-27 NOTE — Telephone Encounter (Signed)
Patient is on crestor m-w- f should he continue taking it based on lab result - 2) patient needs refill for  - lorazepam -  [facility]

## 2012-04-08 ENCOUNTER — Telehealth: Payer: Self-pay | Admitting: Internal Medicine

## 2012-04-08 MED ORDER — OMEPRAZOLE 20 MG PO CPDR: 20.0000 mg | DELAYED_RELEASE_CAPSULE | Freq: Two times a day (BID) | ORAL | Status: DC

## 2012-04-08 NOTE — Telephone Encounter (Signed)
Rx sent 

## 2012-04-08 NOTE — Telephone Encounter (Signed)
Refill: Omeprazole 20mg  capsules. Take 1 capsule by mouth twice daily. Qty 60. Last fill 02-06-12

## 2012-06-07 ENCOUNTER — Other Ambulatory Visit: Payer: Self-pay | Admitting: Internal Medicine

## 2012-06-09 ENCOUNTER — Telehealth: Payer: Self-pay | Admitting: Internal Medicine

## 2012-06-09 NOTE — Telephone Encounter (Signed)
Dr.Hopper please advise 

## 2012-06-09 NOTE — Telephone Encounter (Signed)
Ophthalmology evaluation indicated every 12-24 months rather than evaluation by an optician. This would rule out early cataracts or glaucoma. Dr. Hazle Quant is a good ophthalmologist, but you should see one on your plan

## 2012-06-09 NOTE — Telephone Encounter (Signed)
Left message on VM with Dr.Hopper's recommendation, patient to call back if he has questions

## 2012-06-09 NOTE — Telephone Encounter (Signed)
pt has made appt for CPE but stated at last CPE 10.15.12 hopp stated he needed Eye Exam.  Pt would like specifics as to what Alfonse Flavors wanted him to have done and a recommendation as to whom he should see CB# [phone]

## 2012-08-27 ENCOUNTER — Encounter: Payer: Self-pay | Admitting: Internal Medicine

## 2012-08-27 ENCOUNTER — Ambulatory Visit (INDEPENDENT_AMBULATORY_CARE_PROVIDER_SITE_OTHER): Payer: PRIVATE HEALTH INSURANCE | Admitting: Internal Medicine

## 2012-08-27 VITALS — BP 110/62 | HR 63 | Temp 97.5°F | Resp 12 | Ht 67.0 in | Wt 153.8 lb

## 2012-08-27 DIAGNOSIS — Z0000 Encounter for general adult medical examination without abnormal findings: Secondary | ICD-10-CM

## 2012-08-27 DIAGNOSIS — E785 Hyperlipidemia, unspecified: Secondary | ICD-10-CM

## 2012-08-27 LAB — HEPATIC FUNCTION PANEL
ALT: 24 U/L (ref 0–53)
AST: 29 U/L (ref 0–37)
Albumin: 4 g/dL (ref 3.5–5.2)
Alkaline Phosphatase: 49 U/L (ref 39–117)

## 2012-08-27 LAB — CBC WITH DIFFERENTIAL/PLATELET
Basophils Absolute: 0 10*3/uL (ref 0.0–0.1)
Basophils Relative: 0.6 % (ref 0.0–3.0)
Eosinophils Absolute: 0.1 10*3/uL (ref 0.0–0.7)
Lymphocytes Relative: 32.5 % (ref 12.0–46.0)
MCHC: 33.1 g/dL (ref 30.0–36.0)
Monocytes Relative: 8.6 % (ref 3.0–12.0)
Neutrophils Relative %: 56 % (ref 43.0–77.0)
RBC: 4.46 Mil/uL (ref 4.22–5.81)
RDW: 13.5 % (ref 11.5–14.6)

## 2012-08-27 LAB — BASIC METABOLIC PANEL
CO2: 30 mEq/L (ref 19–32)
Calcium: 9.1 mg/dL (ref 8.4–10.5)
Creatinine, Ser: 1 mg/dL (ref 0.4–1.5)
GFR: 78.9 mL/min (ref >60.00)
Sodium: 137 mEq/L (ref 135–145)

## 2012-08-27 LAB — LIPID PANEL: VLDL: 12.4 mg/dL (ref 0.0–40.0)

## 2012-08-27 LAB — TSH: TSH: 1.97 u[IU]/mL (ref 0.35–5.50)

## 2012-08-27 LAB — PSA: PSA: 0.64 ng/mL (ref 0.10–4.00)

## 2012-08-27 MED ORDER — OMEPRAZOLE 20 MG PO CPDR: DELAYED_RELEASE_CAPSULE | ORAL | Status: DC

## 2012-08-27 MED ORDER — ALBUTEROL SULFATE HFA 108 (90 BASE) MCG/ACT IN AERS: INHALATION_SPRAY | RESPIRATORY_TRACT | Status: DC

## 2012-08-27 NOTE — Patient Instructions (Addendum)
The triggers for dyspepsia or reflux include stress; the "aspirin family" ; alcohol; peppermint; and caffeine (coffee, tea, cola, and chocolate). The aspirin family would include aspirin and the nonsteroidal agents such as ibuprofen &  Naproxen. Tylenol would not cause reflux. If having dyspepsia ; food & drink should be avoided for @ least 2 hours before going to bed.  Exercise at least 30-45 minutes a day,  3-4 days a week.  Eat a low-fat diet with lots of fruits and vegetables, up to 7-9 servings per day.  Consume less than 40 grams of sugar per day from foods & drinks with High Fructose Corn Sugar as #1,2,3 or # 4 on label. If you activate My Chart; the results can be released to you as soon as they populate from the lab. If you choose not to use this program; the labs have to be reviewed, copied & mailed   causing a delay in getting the results to you.

## 2012-08-27 NOTE — Progress Notes (Signed)
  Subjective:    Patient ID: Trevor [name], male    DOB: 06-05-1946, 66 y.o.   MRN: [id number]  HPI  Trevor [name] is here for a physical; he denies acute issues .      Review of Systems  He has run/walk 6 races varying in distance from 2-5 miles in the last year. He denies associated chest pain, palpitations, claudication, or edema. He has noted some dyspnea at the initial 1/4 mile mark; this resolves he is able to complete the race without difficulty. He has no history of asthma; his brother did have asthma.  He is on a heart healthy diet.       Objective:   Physical Exam Gen.: Healthy and well-nourished in appearance. Alert, appropriate and cooperative throughout exam. Head: Normocephalic without obvious abnormalities  Eyes: No corneal or conjunctival inflammation noted. Pupils equal round reactive to light and accommodation. Fundal exam is benign without hemorrhages, exudate, papilledema. Extraocular motion intact. Vision grossly decreased. Ptosis OS > OD Ears: External  ear exam reveals no significant lesions or deformities. Canals clear .TMs normal. Hearing is grossly normal bilaterally. Nose: External nasal exam reveals no deformity or inflammation. Nasal mucosa are pink and moist. No lesions or exudates noted.   Mouth: Oral mucosa and oropharynx reveal no lesions or exudates. Teeth in good repair. Neck: No deformities, masses, or tenderness noted. Range of motion & Thyroid normal Lungs: Normal respiratory effort; chest expands symmetrically. Lungs are clear to auscultation without rales, wheezes, or increased work of breathing. Heart: Slow rate and regular rhythm. Normal S1 and S2. No gallop, click, or rub. S4 with slurring at  LSB . Abdomen: Bowel sounds normal; abdomen soft and nontender. No masses, organomegaly or hernias noted. Genitalia/ DRE: Genitalia normal except for R hydrocoele. Prostate is asymmeyrically enlarged, R lobe > L,w/o nodularity or induration.    Musculoskeletal/extremities:  No clubbing, cyanosis, edema, or deformity noted. Range of motion  normal .Tone & strength  normal.Joints normal. Nail health  good. Vascular: Carotid, radial artery, dorsalis pedis and  posterior tibial pulses are full and equal. No bruits present. Neurologic: Alert and oriented x3. Deep tendon reflexes symmetrical and normal.          Skin: Intact without suspicious lesions or rashes. Lymph: No cervical, axillary, or inguinal lymphadenopathy present. Psych: Mood and affect are normal. Normally interactive                                                                                        Assessment & Plan:  #1 comprehensive physical exam; no acute findings #2 exercise-induced bronchospasm suggested by history; trial of bronchodilator 15-30 minutes pre-race Plan: see Orders

## 2012-08-31 ENCOUNTER — Encounter: Payer: Self-pay | Admitting: Internal Medicine

## 2012-10-29 HISTORY — PX: COLONOSCOPY: SHX174

## 2013-03-13 ENCOUNTER — Encounter: Payer: Self-pay | Admitting: Lab

## 2013-03-16 ENCOUNTER — Other Ambulatory Visit: Payer: PRIVATE HEALTH INSURANCE

## 2013-03-20 ENCOUNTER — Other Ambulatory Visit (INDEPENDENT_AMBULATORY_CARE_PROVIDER_SITE_OTHER): Payer: PRIVATE HEALTH INSURANCE

## 2013-03-20 DIAGNOSIS — E785 Hyperlipidemia, unspecified: Secondary | ICD-10-CM

## 2013-03-20 LAB — LIPID PANEL
Cholesterol: 190 mg/dL (ref 0–200)
HDL: 43.7 mg/dL (ref >39.00)

## 2013-03-22 ENCOUNTER — Encounter: Payer: Self-pay | Admitting: Internal Medicine

## 2013-03-24 ENCOUNTER — Encounter: Payer: Self-pay | Admitting: Internal Medicine

## 2013-06-03 ENCOUNTER — Other Ambulatory Visit: Payer: Self-pay

## 2013-06-04 ENCOUNTER — Telehealth: Payer: Self-pay | Admitting: Internal Medicine

## 2013-06-04 DIAGNOSIS — H6123 Impacted cerumen, bilateral: Secondary | ICD-10-CM

## 2013-06-04 NOTE — Telephone Encounter (Signed)
Patient called stating his ears are clogged again. He tried to use mineral oil/peroxide as Dr. Alwyn Ren has instructed, but he states it only made it worse. He would like referral to ENT.

## 2013-06-05 NOTE — Telephone Encounter (Signed)
OK ; Dx= impacted cerumen

## 2013-06-05 NOTE — Telephone Encounter (Signed)
Entered referral and Left message for the patient.

## 2013-06-05 NOTE — Telephone Encounter (Signed)
Is it ok to refer or should you see him first? Last OV 07/2012

## 2013-08-27 ENCOUNTER — Telehealth: Payer: Self-pay

## 2013-08-27 NOTE — Telephone Encounter (Signed)
Medication List and allergies: reviewed and updated  90 day supply/mail order: na Local prescriptions: [facility] [address]  Immunizations due:  Offered flu vaccine  A/P:  Welcome to Medicare  No changes to FH or PSH CCS--scheduled for 09/10/2013--Dr Medoff PSA WNL--07/2011  To Discuss with Provider: Ran a half marathon recently---[place] for him!!!

## 2013-08-29 LAB — HM COLONOSCOPY

## 2013-08-31 ENCOUNTER — Ambulatory Visit (INDEPENDENT_AMBULATORY_CARE_PROVIDER_SITE_OTHER): Payer: Medicare Other | Admitting: Internal Medicine

## 2013-08-31 ENCOUNTER — Encounter: Payer: Self-pay | Admitting: Internal Medicine

## 2013-08-31 VITALS — BP 115/57 | HR 50 | Temp 97.9°F | Ht 67.25 in

## 2013-08-31 DIAGNOSIS — Z86010 Personal history of colonic polyps: Secondary | ICD-10-CM

## 2013-08-31 DIAGNOSIS — N429 Disorder of prostate, unspecified: Secondary | ICD-10-CM

## 2013-08-31 DIAGNOSIS — E559 Vitamin D deficiency, unspecified: Secondary | ICD-10-CM

## 2013-08-31 DIAGNOSIS — N433 Hydrocele, unspecified: Secondary | ICD-10-CM | POA: Insufficient documentation

## 2013-08-31 DIAGNOSIS — Z8042 Family history of malignant neoplasm of prostate: Secondary | ICD-10-CM

## 2013-08-31 DIAGNOSIS — Z0000 Encounter for general adult medical examination without abnormal findings: Secondary | ICD-10-CM

## 2013-08-31 DIAGNOSIS — Z85828 Personal history of other malignant neoplasm of skin: Secondary | ICD-10-CM | POA: Insufficient documentation

## 2013-08-31 DIAGNOSIS — E785 Hyperlipidemia, unspecified: Secondary | ICD-10-CM

## 2013-08-31 LAB — CBC WITH DIFFERENTIAL/PLATELET
Basophils Absolute: 0 10*3/uL (ref 0.0–0.1)
Eosinophils Absolute: 0.1 10*3/uL (ref 0.0–0.7)
HCT: 41.3 % (ref 39.0–52.0)
Lymphocytes Relative: 32.1 % (ref 12.0–46.0)
Monocytes Absolute: 0.4 10*3/uL (ref 0.1–1.0)
Monocytes Relative: 7.9 % (ref 3.0–12.0)
Neutro Abs: 3 10*3/uL (ref 1.4–7.7)
Neutrophils Relative %: 56.5 % (ref 43.0–77.0)
Platelets: 220 10*3/uL (ref 150.0–400.0)
RBC: 4.45 Mil/uL (ref 4.22–5.81)
RDW: 13.7 % (ref 11.5–14.6)
WBC: 5.4 10*3/uL (ref 4.5–10.5)

## 2013-08-31 LAB — HEPATIC FUNCTION PANEL
ALT: 26 U/L (ref 0–53)
Albumin: 4.2 g/dL (ref 3.5–5.2)
Alkaline Phosphatase: 50 U/L (ref 39–117)
Bilirubin, Direct: 0.2 mg/dL (ref 0.0–0.3)
Total Bilirubin: 1.6 mg/dL — ABNORMAL HIGH (ref 0.3–1.2)
Total Protein: 6.7 g/dL (ref 6.0–8.3)

## 2013-08-31 LAB — LIPID PANEL
Cholesterol: 222 mg/dL — ABNORMAL HIGH (ref 0–200)
HDL: 53.5 mg/dL (ref >39.00)
Total CHOL/HDL Ratio: 4
Triglycerides: 80 mg/dL (ref 0.0–149.0)

## 2013-08-31 LAB — BASIC METABOLIC PANEL
BUN: 22 mg/dL (ref 6–23)
Calcium: 9.2 mg/dL (ref 8.4–10.5)
GFR: 79.56 mL/min (ref >60.00)
Glucose, Bld: 90 mg/dL (ref 70–99)

## 2013-08-31 LAB — TSH: TSH: 2.93 u[IU]/mL (ref 0.35–5.50)

## 2013-08-31 LAB — PSA: PSA: 0.64 ng/mL (ref 0.10–4.00)

## 2013-08-31 LAB — LDL CHOLESTEROL, DIRECT: Direct LDL: 159.2 mg/dL

## 2013-08-31 NOTE — Progress Notes (Signed)
Subjective:    Patient ID: Trevor [name], male    DOB: 07-04-1946, 67 y.o.   MRN: [id number]  HPI  He is here for a welcome to Medicare physical;acute issues denied. Medicare Wellness Visit: Psychosocial and medical history were reviewed as required by Medicare (history related to abuse, antisocial behavior , firearm risk). Social history: Caffeine: 2 cups / day , Alcohol:no  , Tobacco WUJ:WJXB 1980 Exercise:see below Personal safety/fall risk:no Limitations of activities of daily living:no Seatbelt/ smoke alarm use:yes Healthcare Power of Attorney/Living Will status: in place Ophthalmologic exam status:current Hearing evaluation status:current Orientation: Oriented X3 Memory and recall: good Math testing: good Depression/anxiety assessment: denied Foreign travel history:2004 [place] Immunization status for influenza/pneumonia/ shingles /tetanus: Flu never taken Transfusion history:no Preventive health care maintenance status: Colonoscopy as per protocol/standard care: pending Dental care:every 6 mos Chart reviewed and updated. Active issues reviewed and addressed as documented below.     Review of Systems A heart healthy diet is followed; exercise encompasses 5 sessions per week as running 20-25 mp week  & cross training without symptoms.  Family history is negative for premature coronary disease. Advanced cholesterol testing reveals  LDL goal is less than 120 ; ideally < 90 . Previously on tiny doses of Crestor.  Low dose ASA taken Specifically denied are  chest pain, palpitations, dyspnea, or claudication.  Significant abdominal symptoms, memory deficit, or myalgias not present except calf pain with long distance running.     Objective:   Physical Exam Gen.: Healthy and well-nourished in appearance. Alert, appropriate and cooperative throughout exam.Appears younger than stated age  Head: Normocephalic without obvious abnormalities; no alopecia  Eyes: No corneal or  conjunctival inflammation noted. Pupils equal round reactive to light and accommodation. Extraocular motion intact.  Ears: External  ear exam reveals no significant lesions or deformities. Canals clear .TMs normal. Hearing is grossly normal bilaterally. Nose: External nasal exam reveals no deformity or inflammation. Nasal mucosa are pink and moist. No lesions or exudates noted.   Mouth: Oral mucosa and oropharynx reveal no lesions or exudates. Teeth in good repair. Neck: No deformities, masses, or tenderness noted. Range of motion & Thyroid normal. Lungs: Normal respiratory effort; chest expands symmetrically. Lungs are clear to auscultation without rales, wheezes, or increased work of breathing. Heart: Slw rate and regular rhythm. Normal S1 and S2. No gallop, click, or rub. No murmur. Abdomen: Bowel sounds normal; abdomen soft and nontender. No masses, organomegaly or hernias noted. Genitalia: Genitalia normal except for large R hydrocoele. Prostate is symmetrically enlarged w/o  nodularity or induration                                   Musculoskeletal/extremities: No deformity or scoliosis noted of  the thoracic or lumbar spine.   No clubbing, cyanosis, edema, or significant extremity  deformity noted. Range of motion normal .Tone & strength normal. Hand joints reveal minor DJD DIP changes. Fingernail  health good. Minor knee crepitus Able to lie down & sit up w/o help. Negative SLR bilaterally Vascular: Carotid, radial artery, dorsalis pedis and  posterior tibial pulses are full and equal. No bruits present. Neurologic: Alert and oriented x3. Deep tendon reflexes symmetrical and normal.    Skin: Intact without suspicious lesions or rashes. Lymph: No cervical, axillary, or inguinal lymphadenopathy present. Psych: Mood and affect are normal. Normally interactive  Assessment & Plan:  #1 Medicare Wellness  Exam; criteria met ; data entered #2 Problem List/Diagnoses reviewed Plan:  Assessments made/ Orders entered

## 2013-08-31 NOTE — Patient Instructions (Signed)
Your next office appointment will be determined based upon review of your pending labs. Those instructions will be transmitted to you through My Chart . 

## 2013-09-02 ENCOUNTER — Encounter: Payer: Self-pay | Admitting: Internal Medicine

## 2013-09-03 ENCOUNTER — Other Ambulatory Visit: Payer: Self-pay

## 2013-09-03 LAB — VITAMIN D 1,25 DIHYDROXY
Vitamin D2 1, 25 (OH)2: <8 pg/mL
Vitamin D3 1, 25 (OH)2: 44 pg/mL

## 2013-09-12 ENCOUNTER — Encounter: Payer: Self-pay | Admitting: Internal Medicine

## 2013-09-24 ENCOUNTER — Encounter: Payer: Self-pay | Admitting: Internal Medicine

## 2013-10-15 ENCOUNTER — Encounter: Payer: Self-pay | Admitting: Internal Medicine

## 2013-10-21 ENCOUNTER — Encounter: Payer: Self-pay | Admitting: *Deleted

## 2014-08-02 ENCOUNTER — Other Ambulatory Visit: Payer: Self-pay

## 2014-08-02 MED ORDER — ALBUTEROL SULFATE HFA 108 (90 BASE) MCG/ACT IN AERS: INHALATION_SPRAY | RESPIRATORY_TRACT | Status: DC

## 2014-08-13 ENCOUNTER — Other Ambulatory Visit: Payer: Self-pay

## 2014-09-06 ENCOUNTER — Ambulatory Visit (INDEPENDENT_AMBULATORY_CARE_PROVIDER_SITE_OTHER): Payer: Medicare Other | Admitting: Internal Medicine

## 2014-09-06 ENCOUNTER — Encounter: Payer: Self-pay | Admitting: Internal Medicine

## 2014-09-06 ENCOUNTER — Other Ambulatory Visit (INDEPENDENT_AMBULATORY_CARE_PROVIDER_SITE_OTHER): Payer: Medicare Other

## 2014-09-06 VITALS — BP 130/78 | HR 60 | Temp 97.6°F | Resp 12 | Ht 67.0 in | Wt 159.2 lb

## 2014-09-06 DIAGNOSIS — E785 Hyperlipidemia, unspecified: Secondary | ICD-10-CM

## 2014-09-06 DIAGNOSIS — K635 Polyp of colon: Secondary | ICD-10-CM

## 2014-09-06 DIAGNOSIS — N429 Disorder of prostate, unspecified: Secondary | ICD-10-CM

## 2014-09-06 DIAGNOSIS — N400 Enlarged prostate without lower urinary tract symptoms: Secondary | ICD-10-CM

## 2014-09-06 LAB — CBC WITH DIFFERENTIAL/PLATELET
BASOS PCT: 0.8 % (ref 0.0–3.0)
Basophils Absolute: 0 10*3/uL (ref 0.0–0.1)
EOS PCT: 2.6 % (ref 0.0–5.0)
Eosinophils Absolute: 0.1 10*3/uL (ref 0.0–0.7)
HCT: 40.9 % (ref 39.0–52.0)
HEMOGLOBIN: 13.6 g/dL (ref 13.0–17.0)
LYMPHS PCT: 31.1 % (ref 12.0–46.0)
Lymphs Abs: 1.6 10*3/uL (ref 0.7–4.0)
MCHC: 33.4 g/dL (ref 30.0–36.0)
MCV: 92.4 fl (ref 78.0–100.0)
MONOS PCT: 9.5 % (ref 3.0–12.0)
Monocytes Absolute: 0.5 10*3/uL (ref 0.1–1.0)
NEUTROS ABS: 2.8 10*3/uL (ref 1.4–7.7)
Neutrophils Relative %: 56 % (ref 43.0–77.0)
Platelets: 252 10*3/uL (ref 150.0–400.0)
RBC: 4.43 Mil/uL (ref 4.22–5.81)
RDW: 13.3 % (ref 11.5–15.5)
WBC: 5.1 10*3/uL (ref 4.0–10.5)

## 2014-09-06 LAB — HEPATIC FUNCTION PANEL
ALK PHOS: 42 U/L (ref 39–117)
ALT: 26 U/L (ref 0–53)
AST: 38 U/L — ABNORMAL HIGH (ref 0–37)
Albumin: 3.6 g/dL (ref 3.5–5.2)
Bilirubin, Direct: 0.2 mg/dL (ref 0.0–0.3)
TOTAL PROTEIN: 6.8 g/dL (ref 6.0–8.3)
Total Bilirubin: 1.7 mg/dL — ABNORMAL HIGH (ref 0.2–1.2)

## 2014-09-06 LAB — BASIC METABOLIC PANEL
BUN: 23 mg/dL (ref 6–23)
CALCIUM: 8.8 mg/dL (ref 8.4–10.5)
CO2: 28 mEq/L (ref 19–32)
Chloride: 102 mEq/L (ref 96–112)
Creatinine, Ser: 1 mg/dL (ref 0.4–1.5)
GFR: 78.4 mL/min (ref >60.00)
Glucose, Bld: 85 mg/dL (ref 70–99)
Potassium: 4.3 mEq/L (ref 3.5–5.1)
SODIUM: 137 meq/L (ref 135–145)

## 2014-09-06 LAB — LIPID PANEL
CHOLESTEROL: 213 mg/dL — AB (ref 0–200)
HDL: 49.5 mg/dL (ref >39.00)
LDL CALC: 157 mg/dL — AB (ref 0–99)
NonHDL: 163.5
Total CHOL/HDL Ratio: 4
Triglycerides: 35 mg/dL (ref 0.0–149.0)
VLDL: 7 mg/dL (ref 0.0–40.0)

## 2014-09-06 LAB — PSA: PSA: 0.87 ng/mL (ref 0.10–4.00)

## 2014-09-06 LAB — TSH: TSH: 2.31 u[IU]/mL (ref 0.35–4.50)

## 2014-09-06 NOTE — Progress Notes (Signed)
Subjective:    Patient ID: Trevor [name], male    DOB: 02-29-1946, 68 y.o.   MRN: [id number]  HPI He is here to assess active health issues & conditions. PMH, FH, & Social history verified & updated .  He is on a heart healthy diet; he runs 20 miles per week. He has no symptoms with this except when he is in a competitive situation he may have some exercise-induced bronchospasm symptoms which respond to albuterol  There is no family history of premature heart attack or stroke.  Advanced cholesterol testing reveals that his LDL goal is less than 120, ideally less than 90.  There is breast cancer in the family ;his brother had prostate cancer. He has had a history of BPH .Past PSAs have always been less than 1.    Review of Systems   Chest pain, palpitations, tachycardia, exertional dyspnea, paroxysmal nocturnal dyspnea, claudication or edema are absent.  He has no lower urinary tract obstructive symptoms.       Objective:   Physical Exam Gen.: Healthy and well-nourished in appearance. Alert, appropriate and cooperative throughout exam. Appears younger than stated age  Head: Normocephalic without obvious abnormalities  Eyes: No corneal or conjunctival inflammation noted. Pupils equal round reactive to light and accommodation. Extraocular motion intact.Bilateral ptosis Ears: External  ear exam reveals no significant lesions or deformities. Wax on R ; L TM normal. Hearing is grossly normal bilaterally. Nose: External nasal exam reveals no deformity or inflammation. Nasal mucosa are pink and moist. No lesions or exudates noted.   Mouth: Oral mucosa and oropharynx reveal no lesions or exudates. Teeth in good repair. Neck: ROM decreased. Thyroid normal. Lungs: Normal respiratory effort; chest expands symmetrically. Lungs are clear to auscultation without rales, wheezes, or increased work of breathing. Heart: Slow rate and regular rhythm. Normal S1 and S2. No gallop, click, or  rub. No murmur. Abdomen: Bowel sounds normal; abdomen soft and nontender. No masses, organomegaly or hernias noted. Genitalia: Genitalia normal except for left varices. Prostate is asymmetrically enlarged w/o nodularity or induration                                Musculoskeletal/extremities: No deformity or scoliosis noted of  the thoracic or lumbar spine.  No clubbing, cyanosis, edema, or significant extremity  deformity noted. Range of motion normal .Tone & strength normal. Hand joints reveal mild  DJD DIP changes.  Fingernail health good. Able to lie down & sit up w/o help. Negative SLR bilaterally Vascular: Carotid, radial artery, dorsalis pedis and  posterior tibial pulses are full and equal. No bruits present. Neurologic: Alert and oriented x3. Deep tendon reflexes symmetrical and normal.  Gait normal .       Skin: Intact without suspicious lesions or rashes. Lymph: No cervical, axillary, or inguinal lymphadenopathy present. Psych: Mood and affect are normal. Normally interactive                                                                                        Assessment & Plan:  See Current Assessment & Plan  in Problem List under specific Diagnosis9 wheezing

## 2014-09-06 NOTE — Progress Notes (Signed)
Pre visit review using our clinic review tool, if applicable. No additional management support is needed unless otherwise documented below in the visit note. 

## 2014-09-06 NOTE — Assessment & Plan Note (Signed)
CBC & dif 

## 2014-09-06 NOTE — Assessment & Plan Note (Addendum)
PSA as prostate abnormal by DRE

## 2014-09-06 NOTE — Assessment & Plan Note (Addendum)
NMR Lipids, LFTs,BMET, TSH

## 2014-09-06 NOTE — Patient Instructions (Signed)
Your next office appointment will be determined based upon review of your pending labs. Those instructions will be transmitted to you through My Chart . 

## 2014-09-06 NOTE — Assessment & Plan Note (Signed)
CBC

## 2014-09-07 ENCOUNTER — Encounter: Payer: Self-pay | Admitting: Internal Medicine

## 2014-09-14 ENCOUNTER — Ambulatory Visit: Payer: Medicare Other

## 2014-09-17 ENCOUNTER — Ambulatory Visit (INDEPENDENT_AMBULATORY_CARE_PROVIDER_SITE_OTHER): Payer: Medicare Other | Admitting: *Deleted

## 2014-09-17 DIAGNOSIS — Z23 Encounter for immunization: Secondary | ICD-10-CM

## 2014-11-01 ENCOUNTER — Ambulatory Visit: Payer: Medicare Other | Admitting: Sports Medicine

## 2014-11-02 ENCOUNTER — Ambulatory Visit (INDEPENDENT_AMBULATORY_CARE_PROVIDER_SITE_OTHER): Payer: Medicare Other | Admitting: Sports Medicine

## 2014-11-02 ENCOUNTER — Encounter: Payer: Self-pay | Admitting: Sports Medicine

## 2014-11-02 VITALS — BP 130/71 | Ht 67.0 in | Wt 155.0 lb

## 2014-11-02 DIAGNOSIS — M79671 Pain in right foot: Secondary | ICD-10-CM | POA: Diagnosis not present

## 2014-11-02 DIAGNOSIS — M7661 Achilles tendinitis, right leg: Secondary | ICD-10-CM | POA: Diagnosis not present

## 2014-11-02 DIAGNOSIS — M79672 Pain in left foot: Secondary | ICD-10-CM | POA: Diagnosis not present

## 2014-11-02 DIAGNOSIS — M7662 Achilles tendinitis, left leg: Secondary | ICD-10-CM

## 2014-11-02 NOTE — Progress Notes (Signed)
Subjective:    Patient ID: Noel GeroldFrederick M O'Reilly is a 69 y.o. male.  Chief Complaint: HPI Mr. Judeen HammansO'Reilly is a 69 yo male who presents with bilateral heel pain, worse on the right than the left. He started running in January 2013, and has since worked up to running  17 races last year, ranging from 5ks to [employer]. About 6 months ago he started to experience right heel pain after running, and has since began to develop milder left heel pain as well. He localizes this pain primarily to where his Achilles tendon inserts into the calcaneous. He will ice this area after running when it becomes painful and reports an improvement in symptoms. He has also been taking ibuprofen, but noticed he was requiring this nearly daily and has since tried to limit his ibuprofen intake. He has never had any injuries to this area nor surgery. The pain does not radiate anywhere. He denies any instability or weakness.      Social History   Social History Main Topics  . Smoking status: Former Smoker    Quit date: 10/29/1978  . Smokeless tobacco: Not on file     Comment: smoked 1967-1980, up to 1 ppd  . Alcohol Use: No  . Drug Use: Not on file  . Sexual Activity: Not on file    ROS  Negative other than noted in HPI.      Objective:   Ortho Exam  Vitals: BP 130/71 General: well-appearing, pleasant gentleman in no acute distress  Right Ankle/Foot:  Inspection: no atrophy, asymmetry, effusion or discoloration Palpation: tenderness to palpation over the distal achilles tendon, at the insertion into the calcaneous. No tenderness over the medial or lateral malleolus. No tenderness over the calcaneal tubercle.  ROM: full, painless ROM in dorsiflexion, plantarflexion, inversion and eversion Strength: 5/5 muscular strength in dorsiflexion, plantarflexion of foot and toes.  Neurovascular: sensation grossly intact bilaterally, 2+ in dorsalis pedis and posterior tibialis   Left Ankle/Foot:  Inspection: no atrophy,  asymmetry, effusion or discoloration Palpation: tenderness to palpation over the distal achilles tendon, at the insertion into the calcaneous. No tenderness over the medial or lateral malleolus. No tenderness over the calcaneal tubercle.  ROM: full, painless ROM in dorsiflexion, plantarflexion, inversion and eversion Strength: 5/5 muscular strength in dorsiflexion, plantarflexion of foot and toes.  Neurovascular: sensation grossly intact bilaterally, 2+ in dorsalis pedis and posterior tibialis   Ultrasound Imaging: MSK ultrasound of the right and left Achilles tendon was performed. There is a small hypoechoic area in the distal right Achilles tendon at its insertion onto the calcaneus. There is also a small area of calcification in the same area. Findings are consistent with a small intrasubstance tear of the distal Achilles tendon. Tendon thickness proximal to the calcaneus is a normal 0.38 cm. I do not appreciate any significant calcaneal spurring. A quick scan of the left symptomatic left Achilles tendon shows no obvious tear.      Assessment:     1. Heel pain, bilateral        Plan:     1. Heel Pain:  Given [name]'s history of extensive running and  localization of this pain to the insertion of the Achilles tendon into the calcaneous, this is most likely insertional achilles tendinopathy.   - Performed ultrasound imaging: showed normal Achilles tendon thickness on the right side, with a small tear and calcification.  - Discussed eccentric exercises to help stretch and strengthen the Achilles - Provided pt with heel lift  insert to help remove tension from Achilles while running - Will f/u in 6 weeks. If no improvement at that time, will consider trying a nitroglycerin patch to help increase blood flow into the effected area, helping with repair.    This patient was seen with and note was dictated by Gorden Harms, MS4 [facility]).     patient was seen and evaluated with medical  student. I agree with the above plan of care.

## 2014-11-19 ENCOUNTER — Encounter: Payer: Self-pay | Admitting: Internal Medicine

## 2014-12-22 ENCOUNTER — Encounter: Payer: Self-pay | Admitting: Sports Medicine

## 2014-12-22 ENCOUNTER — Ambulatory Visit (INDEPENDENT_AMBULATORY_CARE_PROVIDER_SITE_OTHER): Payer: Medicare Other | Admitting: Sports Medicine

## 2014-12-22 VITALS — BP 124/70 | HR 60 | Ht 67.0 in | Wt 157.0 lb

## 2014-12-22 DIAGNOSIS — M7662 Achilles tendinitis, left leg: Secondary | ICD-10-CM

## 2014-12-22 DIAGNOSIS — M7661 Achilles tendinitis, right leg: Secondary | ICD-10-CM

## 2014-12-23 NOTE — Progress Notes (Signed)
   Subjective:    Patient ID: Trevor [name], male    DOB: January 04, 1946, 69 y.o.   MRN: [id number]  HPI   Patient comes in today for follow-up on right heel pain. Overall he is doing better. He has been compliant with his home exercises and as a result he has been able to continue running. Still gets intermittent discomfort in the posterior heel after running but it is less than before. He has found the heel lifts to be comfortable. Prior ultrasound showed a small intrasubstance tear in the Achilles tendon just proximal to the insertion onto the calcaneus.    Review of Systems     Objective:   Physical Exam Well-developed, well-nourished. No acute distress. Awake alert and oriented 3.  Right heel: No tenderness to palpation along the Achilles tendon. No pain with Achilles stretch. Good strength. No soft tissue swelling. Neurovascular intact distally. Walking without a limp.  MSK ultrasound was performed. The previous hypoechoic area seen in the distal Achilles tendon has nearly resolved. There is still a slight remnant of the tear remaining but overall it is much improved.      Assessment & Plan:  Improving right heel pain secondary to insertional Achilles tendinitis/tendinopathy  Patient will continue with his eccentric exercises. Continue with his heel lifts when running. Continue with activity as tolerated. Of note, he was also complaining of some mild hamstring pain on the right. No injury. This is been present only for a few days. I gave him the [place] exercises to start performing and if symptoms here worsen he will return to the office for a more formal evaluation. Otherwise, follow-up when necessary.

## 2015-01-27 ENCOUNTER — Ambulatory Visit (INDEPENDENT_AMBULATORY_CARE_PROVIDER_SITE_OTHER): Payer: Medicare Other | Admitting: Internal Medicine

## 2015-01-27 ENCOUNTER — Ambulatory Visit (INDEPENDENT_AMBULATORY_CARE_PROVIDER_SITE_OTHER)
Admission: RE | Admit: 2015-01-27 | Discharge: 2015-01-27 | Disposition: A | Discharge status: Home / Self Care | Payer: Medicare Other | Source: Ambulatory Visit | Attending: Internal Medicine | Admitting: Internal Medicine

## 2015-01-27 ENCOUNTER — Encounter: Payer: Self-pay | Admitting: Internal Medicine

## 2015-01-27 VITALS — BP 100/80 | HR 54 | Temp 97.8°F | Ht 67.0 in | Wt 162.0 lb

## 2015-01-27 DIAGNOSIS — J45991 Cough variant asthma: Secondary | ICD-10-CM | POA: Diagnosis not present

## 2015-01-27 DIAGNOSIS — R0789 Other chest pain: Secondary | ICD-10-CM

## 2015-01-27 MED ORDER — MONTELUKAST SODIUM 10 MG PO TABS: 10.0000 mg | ORAL_TABLET | Freq: Every day | ORAL | Status: DC

## 2015-01-27 NOTE — Patient Instructions (Signed)
Breo one inhalation @ bedtime; gargle and spit after use. Lot #: [id number] Exp 11/17 Stretching and warming up prior to exercise will allow the airways to acclimate. Continue to use albuterol one spray 30 minutes prior and one puff 15 minutes prior to exercise. Take the Singulair 10 mg the evening prior to the exercise program  If symptoms persist.

## 2015-01-27 NOTE — Progress Notes (Signed)
Pre visit review using our clinic review tool, if applicable. No additional management support is needed unless otherwise documented below in the visit note. 

## 2015-01-27 NOTE — Progress Notes (Signed)
   Subjective:    Patient ID: Trevor [name], male    DOB: Mar 15, 1946, 69 y.o.   MRN: [id number]  HPI He has shortness of breath  & cough associated with running as well as some nocturnal cough. The cough typically has occurred after he runs. It's only been productive on 2 occasions of green sputum;otherwise it has been dry. He been using albuterol 2 puffs prior to running & after running as needed. He does run 20 miles per week.  He's also used albuterol the last 2 nights because of "chest tightness" .He has had some nocturnal cough  for a couple weeks.   He denies chest tightness with running .  He is no upper respiratory tract or extrinsic symptoms otherwise.   He has no history of asthma although his brother did. He smoked 1-1.5 packs per day for 15 years.  Review of Systems Frontal headache, facial pain , nasal purulence, dental pain, sore throat , otic pain or otic discharge denied. No fever , chills or sweats.  Extrinsic symptoms of itchy, watery eyes, sneezing, or angioedema are denied. There is no sputum production, wheezing,or  paroxysmal nocturnal dyspnea.     Objective:   Physical Exam General appearance:Adequately nourished; no acute distress or increased work of breathing is present.   BMI:  Lymphatic: No  lymphadenopathy about the head, neck, or axilla .  Eyes: No conjunctival inflammation or lid edema is present. There is no scleral icterus.Ptosis OS  Ears:  External ear exam shows no significant lesions or deformities.  Otoscopic examination reveals clear canals, tympanic membranes are intact bilaterally without bulging, retraction, inflammation or discharge.  Nose:  External nasal examination shows no deformity or inflammation. Nasal mucosa are pink and moist without lesions or exudates No septal dislocation or deviation.No obstruction to airflow.   Oral exam: Dental hygiene is good; lips and gums are healthy appearing.There is no oropharyngeal erythema or  exudate .  Neck:  No deformities, thyromegaly, masses, or tenderness noted.   Supple with full range of motion without pain.   Heart:  Normal rate and regular rhythm. S1 and S2 normal without gallop, murmur, click, rub or other extra sounds.   Lungs:Chest clear to auscultation; no wheezes, rhonchi,rales ,or rubs present.  Extremities:  No cyanosis, edema, or clubbing  noted    Skin: Warm & dry w/o tenting or jaundice. No significant lesions or rash.        Assessment & Plan:  #1 cough variant asthma  #2 nocturnal cough and chest tightness, again probably asthma variant  Plan: Preventive measures for the exercise-induced bronchospasm was discussed. He will be given a sample of Breo to use for the next 2 weeks in the evenings. Singulair would be added if the symptoms persist.  EKG will be done along with chest x-ray because of the history of chest tightness.

## 2015-02-18 ENCOUNTER — Other Ambulatory Visit: Payer: Self-pay | Admitting: Internal Medicine

## 2015-02-18 ENCOUNTER — Telehealth: Payer: Self-pay

## 2015-02-18 NOTE — Telephone Encounter (Signed)
Call to the patient and educated regarding AWV; Mbr normally schedule annual exam in Nov; Agreed to fup in October to schedule an apt.  Also did note the mbr had not rec'd a Pneumonia vaccination and educated on CDC recommendations; [facility], as the patient stated he had his shingles, but generally does not take the flu. Will discuss when he comes in for his wellness exam.

## 2015-04-25 ENCOUNTER — Other Ambulatory Visit: Payer: Self-pay

## 2015-08-12 ENCOUNTER — Telehealth: Payer: Self-pay

## 2015-08-12 NOTE — Telephone Encounter (Signed)
Call to the patient and LVM to fup on AWV; Stated that he would schedule closer to his apt in November. Left message for call back; CPE 9:30 11/4

## 2015-08-17 NOTE — Telephone Encounter (Signed)
2nd outreach attempt for AWV; LVM that this is voluntary and if he can come in prior to his CPE to leave a vm at confidential number;

## 2015-08-17 NOTE — Telephone Encounter (Signed)
Call back to discuss AWv and has BCBC; Sees the provider as needed and comes in for annual; Runs 25 miles a week; takes good care of his health. States he had a pneumonia vaccine; There is not one listed; Educated regarding CDC.gov recommendation for pneumonia vaccinations; Refuses flu shot at this time. Will plan to have CPE with Dr. Alwyn RenHopper and will completed AWV at that time.  Thanked him for calling back

## 2015-09-05 ENCOUNTER — Ambulatory Visit (INDEPENDENT_AMBULATORY_CARE_PROVIDER_SITE_OTHER): Payer: Medicare Other | Admitting: Sports Medicine

## 2015-09-05 ENCOUNTER — Encounter: Payer: Self-pay | Admitting: Sports Medicine

## 2015-09-05 VITALS — BP 124/54 | Ht 67.0 in | Wt 157.0 lb

## 2015-09-05 DIAGNOSIS — S92912A Unspecified fracture of left toe(s), initial encounter for closed fracture: Secondary | ICD-10-CM | POA: Diagnosis not present

## 2015-09-05 NOTE — Progress Notes (Signed)
   Subjective:    Patient ID: Trevor [name], male    DOB: 1946/03/04, 69 y.o.   MRN: [id number]  HPI 69 year old male, very active runner presenting for same-day appointment to evaluate a foot/toe injury. Patient reports that on Saturday he accidentally dropped a 1 gallon glass container filled with juice onto the dorsal aspect of his left foot. He immediately had some swelling and lots of pain along his left fourth toe. This pain and swelling has persisted through the weekend thus he made the appointment today to evaluate if he had a fracture or other significant injury that were occluded him from his scheduled running/straining.  He is currently scheduled to run 2 different 5k races on Saturday. At baseline he runs about 20-25 miles per week and typically trains for several half marathons or greater distance per year.   He denies any weakness, ecchymosis, paresthesias or other symptoms in the foot.  Past medical history, past surgical history, medications, social history and allergies were reviewed and updated in the EMR  Review of Systems 10 point review systems was negative other than described above in history of present illness     Objective:   Physical Exam  General: -- Well appearing and in NAD; resting comfortably on exam table  Cardiopulmonary: -- Non-labored respirations -- No JVD; 2+ pulses in distal upper and lower extremities  Skin/derm: -- No rashes present on the examined skin of abdomen/chest/back and the extremities  Neurology: -- No focal deficits on interview/exam -- Neurovascular intact in examined extremities  Psych: -- Appropriate mood/affect; normal thought content  LEFT foot exam: -Dorsiflexion and plantar flexion of the digits are symmetric and appropriate relative to the contralateral foot. -There is 5/5 strength in great toe as well as toe 2 through 5 dorsiflexion and plantar flexion. The ankle also exhibits 5/5 strength and normal range of  motion in dorsi and plantar flexion. -Some mild swelling along the proximal aspect of the fourth toe near the MTP joint. -Very focally tender along the proximal aspect of the fourth toe. Nontender along the PIP and DIP joints of the fourth toe. No angulation or malrotation appreciated. -Nontender until the distal most aspect of the fourth metatarsal. -Neurovascularly intact distally.   Musculoskeletal ultrasound of the LEFT fourth digit and metatarsal: -There was no area of discontinuity identified on the distal aspect of the fourth metatarsal.  - However near the MTP joint there was an area of swelling which communicated with the joint. -This area along the distal aspect of the metatarsal was diffusely vascular along the bone edge on Doppler  -The proximal aspect of the fourth digit exhibited discontinuity approximately 1 cm distal to the MTP joint; this area was highly vascular on Doppler and had some adjacent hypoechoic changes concerning for edema -The interphalangeal joints (DIP and PIP joint) of the fourth digit were unremarkable -Findings consistent with a nondisplaced proximal phalanx fracture of the left fourth toe.      Assessment & Plan:   Left foot pain secondary to nondisplaced proximal phalanx fracture of the left fourth toe  Plan: Buddy tape for comfort. He is okay to continue with all activity including running using pain as his guide. Follow-up with me in 3 weeks for reevaluation and repeat ultrasound. Call with questions or concerns in the interim.  ===

## 2015-09-12 ENCOUNTER — Ambulatory Visit (INDEPENDENT_AMBULATORY_CARE_PROVIDER_SITE_OTHER): Payer: Medicare Other | Admitting: Internal Medicine

## 2015-09-12 ENCOUNTER — Encounter: Payer: Self-pay | Admitting: Internal Medicine

## 2015-09-12 ENCOUNTER — Other Ambulatory Visit (INDEPENDENT_AMBULATORY_CARE_PROVIDER_SITE_OTHER): Payer: Medicare Other

## 2015-09-12 VITALS — BP 122/78 | HR 57 | Temp 98.6°F | Resp 20 | Ht 67.0 in | Wt 158.8 lb

## 2015-09-12 DIAGNOSIS — N429 Disorder of prostate, unspecified: Secondary | ICD-10-CM

## 2015-09-12 DIAGNOSIS — E785 Hyperlipidemia, unspecified: Secondary | ICD-10-CM | POA: Diagnosis not present

## 2015-09-12 DIAGNOSIS — K635 Polyp of colon: Secondary | ICD-10-CM | POA: Diagnosis not present

## 2015-09-12 LAB — BASIC METABOLIC PANEL
BUN: 22 mg/dL (ref 6–23)
CALCIUM: 9.3 mg/dL (ref 8.4–10.5)
CO2: 30 mEq/L (ref 19–32)
CREATININE: 0.98 mg/dL (ref 0.40–1.50)
Chloride: 103 mEq/L (ref 96–112)
GFR: 80.93 mL/min (ref >60.00)
Glucose, Bld: 92 mg/dL (ref 70–99)
Potassium: 4.5 mEq/L (ref 3.5–5.1)
Sodium: 139 mEq/L (ref 135–145)

## 2015-09-12 LAB — HEPATIC FUNCTION PANEL
ALK PHOS: 49 U/L (ref 39–117)
ALT: 21 U/L (ref 0–53)
AST: 34 U/L (ref 0–37)
Albumin: 4.4 g/dL (ref 3.5–5.2)
BILIRUBIN DIRECT: 0.3 mg/dL (ref 0.0–0.3)
BILIRUBIN TOTAL: 2.1 mg/dL — AB (ref 0.2–1.2)
TOTAL PROTEIN: 6.7 g/dL (ref 6.0–8.3)

## 2015-09-12 LAB — CBC WITH DIFFERENTIAL/PLATELET
BASOS ABS: 0 10*3/uL (ref 0.0–0.1)
Basophils Relative: 0.7 % (ref 0.0–3.0)
EOS ABS: 0.1 10*3/uL (ref 0.0–0.7)
Eosinophils Relative: 2.4 % (ref 0.0–5.0)
HEMATOCRIT: 40.4 % (ref 39.0–52.0)
HEMOGLOBIN: 13.7 g/dL (ref 13.0–17.0)
LYMPHS PCT: 32.7 % (ref 12.0–46.0)
Lymphs Abs: 1.7 10*3/uL (ref 0.7–4.0)
MCHC: 33.9 g/dL (ref 30.0–36.0)
MCV: 91.5 fl (ref 78.0–100.0)
MONO ABS: 0.4 10*3/uL (ref 0.1–1.0)
Monocytes Relative: 8.5 % (ref 3.0–12.0)
Neutro Abs: 2.9 10*3/uL (ref 1.4–7.7)
Neutrophils Relative %: 55.7 % (ref 43.0–77.0)
Platelets: 238 10*3/uL (ref 150.0–400.0)
RBC: 4.42 Mil/uL (ref 4.22–5.81)
RDW: 13.4 % (ref 11.5–15.5)
WBC: 5.3 10*3/uL (ref 4.0–10.5)

## 2015-09-12 LAB — PSA: PSA: 0.69 ng/mL (ref 0.10–4.00)

## 2015-09-12 LAB — LIPID PANEL
CHOL/HDL RATIO: 4
Cholesterol: 213 mg/dL — ABNORMAL HIGH (ref 0–200)
HDL: 52.9 mg/dL (ref >39.00)
LDL CALC: 147 mg/dL — AB (ref 0–99)
NONHDL: 160.5
Triglycerides: 67 mg/dL (ref 0.0–149.0)
VLDL: 13.4 mg/dL (ref 0.0–40.0)

## 2015-09-12 LAB — TSH: TSH: 2.69 u[IU]/mL (ref 0.35–4.50)

## 2015-09-12 NOTE — Assessment & Plan Note (Signed)
Lipids, LFTs, TSH  

## 2015-09-12 NOTE — Progress Notes (Signed)
Pre visit review using our clinic review tool, if applicable. No additional management support is needed unless otherwise documented below in the visit note. 

## 2015-09-12 NOTE — Progress Notes (Signed)
   Subjective:    Patient ID: Trevor [name], male    DOB: 05-06-1946, 69 y.o.   MRN: [id number]  HPI The patient is here to assess status of active health conditions. He is on a heart healthy diet and restricts salt. He exercises at a high level VII-VIII hours per week without associated cardiopulmonary symptoms. He does have a history of exercise-induced bronchospasm and will use albuterol prior to exercise.  He's been told he has early cataracts. Also because of ptosis there is some visual field issues. He is considering having the surgery electively.  Colonoscopy was done in 2014 by Dr. Kinnie ScalesMedoff. It was negative and is due in follow-up 2024. He has no active GI symptoms.  PMH, FH, & Social History reviewed & updated.No change in FH as recorded. His mother did die at the age of 67 this year.  Review of Systems  Chest pain, palpitations, tachycardia, exertional dyspnea, paroxysmal nocturnal dyspnea, claudication or edema are absent. No unexplained weight loss, abdominal pain, significant dyspepsia, dysphagia, melena, rectal bleeding, or persistently small caliber stools. Dysuria, pyuria, hematuria, frequency, nocturia or polyuria are denied. Change in hair, skin, nails denied. No bowel changes of constipation or diarrhea. No intolerance to heat or cold.    Objective:   Physical Exam  Pertinent or positive findings include: Bilateral ptosis present. Has an upper retainer. He has minimal DIP osteoarthritic changes and mild crepitus of the knees. Prostate is 2.5+ enlarged without nodularity. Hydrocele is present on the right.  General appearance :adequately nourished; in no distress.  Eyes: No conjunctival inflammation or scleral icterus is present.  Oral exam:  Lips and gums are healthy appearing.There is no oropharyngeal erythema or exudate noted. Dental hygiene is good.  Heart:  Slow rate and regular rhythm. S1 and S2 normal without gallop, murmur, click, rub or other extra sounds      Lungs:Chest clear to auscultation; no wheezes, rhonchi,rales ,or rubs present.No increased work of breathing.   Abdomen: bowel sounds normal, soft and non-tender without masses, organomegaly or hernias noted.  No guarding or rebound.   Vascular : all pulses equal ; no bruits present.  Skin:Warm & dry.  Intact without suspicious lesions or rashes ; no tenting or jaundice   Lymphatic: No lymphadenopathy is noted about the head, neck, axilla, or inguinal areas.   Neuro: Strength, tone & DTRs normal.      Assessment & Plan:  See Current Assessment & Plan in Problem List under specific Diagnosis

## 2015-09-12 NOTE — Assessment & Plan Note (Signed)
CBC

## 2015-09-12 NOTE — Patient Instructions (Signed)
  Your next office appointment will be determined based upon review of your pending labs. Those written interpretation of the lab results and instructions will be transmitted to you by My Chart  Critical results will be called.   Followup as needed for any active or acute issue. Please report any significant change in your symptoms. 

## 2015-09-26 ENCOUNTER — Encounter: Payer: Self-pay | Admitting: Sports Medicine

## 2015-09-26 ENCOUNTER — Ambulatory Visit (INDEPENDENT_AMBULATORY_CARE_PROVIDER_SITE_OTHER): Payer: Medicare Other | Admitting: Sports Medicine

## 2015-09-26 VITALS — BP 140/70 | Ht 67.0 in | Wt 157.0 lb

## 2015-09-26 DIAGNOSIS — S92912D Unspecified fracture of left toe(s), subsequent encounter for fracture with routine healing: Secondary | ICD-10-CM

## 2015-09-26 NOTE — Progress Notes (Signed)
   Subjective:    Patient ID: Trevor [name], male    DOB: [birth year], 69 y.o.   MRN: [id number]  HPI   Patient comes in today for follow-up on a left fourth toe fracture. He is now 3 weeks status post injury. He has continued to run with little pain. He does have some pain post running as well as some pain with direct pressure over the area. Still has swelling as well. Prior ultrasound showed a nondisplaced fracture through the proximal phalanx of his left fourth toe.    Review of Systems     Objective:   Physical Exam Will develop, well-nourished. No acute distress  Left foot: Examination of the left foot with attention to the left fourth toe shows moderate persistent swelling. He remains tender to palpation at the proximal phalanx over the fracture site. No clinical angulation or malrotation. No skin breakdown. Neurovascularly intact distally. Walking without a limp.  MSK ultrasound of the left fourth toe was performed. Images were obtained in both long and short view. There is callus over the proximal phalanx fracture consistent with a healing fracture.       Assessment & Plan:  3 weeks status post healing nondisplaced proximal phalynx fracture left fourth toe  Patient may continue with activity as tolerated. Follow-up in 3 more weeks for repeat ultrasound. Call with questions or concerns in the interim.

## 2015-10-17 ENCOUNTER — Ambulatory Visit: Payer: Medicare Other | Admitting: Sports Medicine

## 2015-10-18 ENCOUNTER — Telehealth: Payer: Self-pay

## 2015-10-18 NOTE — Telephone Encounter (Signed)
Left Voice Mail for pt to call back.   RE: Flu Vaccine for 2016  

## 2015-10-18 NOTE — Telephone Encounter (Signed)
Patient would like to know if he needs anymore of the pneumo vaccines. He got one awhile back but he is not sure as to which one and he also is 69. Please advise

## 2015-10-19 NOTE — Telephone Encounter (Signed)
Prevnar apparently needed

## 2015-10-20 ENCOUNTER — Ambulatory Visit: Payer: Medicare Other

## 2015-10-21 ENCOUNTER — Ambulatory Visit (INDEPENDENT_AMBULATORY_CARE_PROVIDER_SITE_OTHER): Payer: Medicare Other

## 2015-10-21 DIAGNOSIS — Z23 Encounter for immunization: Secondary | ICD-10-CM | POA: Diagnosis not present

## 2015-10-30 ENCOUNTER — Inpatient Hospital Stay (HOSPITAL_COMMUNITY)
Admission: EM | Admit: 2015-10-30 | Discharge: 2015-10-31 | DRG: 287 | Disposition: A | Discharge status: Home / Self Care | Payer: Medicare Other | Attending: Cardiovascular Disease | Admitting: Cardiovascular Disease

## 2015-10-30 ENCOUNTER — Ambulatory Visit (HOSPITAL_COMMUNITY): Admit: 2015-10-30 | Payer: Self-pay | Admitting: Cardiovascular Disease

## 2015-10-30 ENCOUNTER — Encounter (HOSPITAL_COMMUNITY): Payer: Self-pay | Admitting: *Deleted

## 2015-10-30 ENCOUNTER — Inpatient Hospital Stay (HOSPITAL_COMMUNITY): Payer: Medicare Other

## 2015-10-30 ENCOUNTER — Encounter (HOSPITAL_COMMUNITY)
Admission: EM | Disposition: A | Discharge status: Home / Self Care | Payer: Self-pay | Source: Home / Self Care | Attending: Cardiovascular Disease

## 2015-10-30 DIAGNOSIS — Z830 Family history of human immunodeficiency virus [HIV] disease: Secondary | ICD-10-CM | POA: Diagnosis not present

## 2015-10-30 DIAGNOSIS — Z823 Family history of stroke: Secondary | ICD-10-CM

## 2015-10-30 DIAGNOSIS — R072 Precordial pain: Secondary | ICD-10-CM | POA: Diagnosis not present

## 2015-10-30 DIAGNOSIS — Z886 Allergy status to analgesic agent status: Secondary | ICD-10-CM

## 2015-10-30 DIAGNOSIS — I2510 Atherosclerotic heart disease of native coronary artery without angina pectoris: Secondary | ICD-10-CM | POA: Diagnosis present

## 2015-10-30 DIAGNOSIS — I309 Acute pericarditis, unspecified: Secondary | ICD-10-CM | POA: Diagnosis present

## 2015-10-30 DIAGNOSIS — Z809 Family history of malignant neoplasm, unspecified: Secondary | ICD-10-CM

## 2015-10-30 DIAGNOSIS — E785 Hyperlipidemia, unspecified: Secondary | ICD-10-CM | POA: Diagnosis present

## 2015-10-30 DIAGNOSIS — Z803 Family history of malignant neoplasm of breast: Secondary | ICD-10-CM | POA: Diagnosis not present

## 2015-10-30 DIAGNOSIS — Z8042 Family history of malignant neoplasm of prostate: Secondary | ICD-10-CM | POA: Diagnosis not present

## 2015-10-30 DIAGNOSIS — Z85828 Personal history of other malignant neoplasm of skin: Secondary | ICD-10-CM | POA: Diagnosis not present

## 2015-10-30 DIAGNOSIS — N400 Enlarged prostate without lower urinary tract symptoms: Secondary | ICD-10-CM | POA: Diagnosis present

## 2015-10-30 DIAGNOSIS — I2109 ST elevation (STEMI) myocardial infarction involving other coronary artery of anterior wall: Secondary | ICD-10-CM

## 2015-10-30 DIAGNOSIS — R079 Chest pain, unspecified: Secondary | ICD-10-CM | POA: Diagnosis present

## 2015-10-30 DIAGNOSIS — Z811 Family history of alcohol abuse and dependence: Secondary | ICD-10-CM

## 2015-10-30 DIAGNOSIS — Z86010 Personal history of colonic polyps: Secondary | ICD-10-CM | POA: Diagnosis not present

## 2015-10-30 DIAGNOSIS — R9431 Abnormal electrocardiogram [ECG] [EKG]: Secondary | ICD-10-CM

## 2015-10-30 HISTORY — DX: Atherosclerotic heart disease of native coronary artery without angina pectoris: I25.10

## 2015-10-30 HISTORY — DX: Acute pericarditis, unspecified: I30.9

## 2015-10-30 HISTORY — PX: CARDIAC CATHETERIZATION: SHX172

## 2015-10-30 LAB — COMPREHENSIVE METABOLIC PANEL
ALBUMIN: 3.8 g/dL (ref 3.5–5.0)
ALT: 28 U/L (ref 17–63)
ANION GAP: 9 (ref 5–15)
AST: 48 U/L — ABNORMAL HIGH (ref 15–41)
Alkaline Phosphatase: 53 U/L (ref 38–126)
BUN: 17 mg/dL (ref 6–20)
CHLORIDE: 102 mmol/L (ref 101–111)
CO2: 24 mmol/L (ref 22–32)
CREATININE: 0.94 mg/dL (ref 0.61–1.24)
Calcium: 8.9 mg/dL (ref 8.9–10.3)
GFR calc non Af Amer: >60 mL/min (ref >60)
GLUCOSE: 128 mg/dL — AB (ref 65–99)
Potassium: 4.6 mmol/L (ref 3.5–5.1)
SODIUM: 135 mmol/L (ref 135–145)
Total Bilirubin: 2.5 mg/dL — ABNORMAL HIGH (ref 0.3–1.2)
Total Protein: 6.9 g/dL (ref 6.5–8.1)

## 2015-10-30 LAB — POCT I-STAT TROPONIN I: Troponin i, poc: 0.01 ng/mL (ref 0.00–0.08)

## 2015-10-30 LAB — CBC
HCT: 40.7 % (ref 39.0–52.0)
HCT: 36.8 % — ABNORMAL LOW (ref 39.0–52.0)
HEMOGLOBIN: 13.9 g/dL (ref 13.0–17.0)
Hemoglobin: 12.6 g/dL — ABNORMAL LOW (ref 13.0–17.0)
MCH: 31 pg (ref 26.0–34.0)
MCH: 31 pg (ref 26.0–34.0)
MCHC: 34.2 g/dL (ref 30.0–36.0)
MCHC: 34.2 g/dL (ref 30.0–36.0)
MCV: 90.6 fL (ref 78.0–100.0)
MCV: 90.6 fL (ref 78.0–100.0)
PLATELETS: 242 10*3/uL (ref 150–400)
PLATELETS: 215 10*3/uL (ref 150–400)
RBC: 4.49 MIL/uL (ref 4.22–5.81)
RBC: 4.06 MIL/uL — ABNORMAL LOW (ref 4.22–5.81)
RDW: 13.3 % (ref 11.5–15.5)
RDW: 13.5 % (ref 11.5–15.5)
WBC: 10.4 10*3/uL (ref 4.0–10.5)
WBC: 8.7 10*3/uL (ref 4.0–10.5)

## 2015-10-30 LAB — PROTIME-INR
INR: 1.06 (ref 0.00–1.49)
PROTHROMBIN TIME: 14 s (ref 11.6–15.2)

## 2015-10-30 LAB — APTT: APTT: 27 s (ref 24–37)

## 2015-10-30 LAB — TROPONIN I: TROPONIN I: 0.03 ng/mL (ref <0.031)

## 2015-10-30 LAB — D-DIMER, QUANTITATIVE: D-Dimer, Quant: 0.38 ug/mL-FEU (ref 0.00–0.50)

## 2015-10-30 SURGERY — LEFT HEART CATH AND CORONARY ANGIOGRAPHY
Anesthesia: Moderate Sedation

## 2015-10-30 MED ORDER — HEPARIN SODIUM (PORCINE) 5000 UNIT/ML IJ SOLN
INTRAMUSCULAR | Status: AC
  Filled 2015-10-30: qty 1

## 2015-10-30 MED ORDER — NITROGLYCERIN 1 MG/10 ML FOR IR/CATH LAB
INTRA_ARTERIAL | Status: AC
  Filled 2015-10-30: qty 10

## 2015-10-30 MED ORDER — VITAMIN D 1000 UNITS PO TABS
2000.0000 [IU] | ORAL_TABLET | Freq: Every day | ORAL | Status: DC
  Administered 2015-10-31: 2000 [IU] via ORAL
  Filled 2015-10-30: qty 2

## 2015-10-30 MED ORDER — SODIUM CHLORIDE 0.9 % IV SOLN
INTRAVENOUS | Status: AC
  Administered 2015-10-30: 12:00:00 via INTRAVENOUS

## 2015-10-30 MED ORDER — SODIUM CHLORIDE 0.9 % IJ SOLN: 3.0000 mL | Freq: Two times a day (BID) | INTRAMUSCULAR | Status: DC

## 2015-10-30 MED ORDER — VERAPAMIL HCL 2.5 MG/ML IV SOLN
INTRAVENOUS | Status: DC | PRN
  Administered 2015-10-30: 10 mL via INTRA_ARTERIAL

## 2015-10-30 MED ORDER — HEPARIN (PORCINE) IN NACL 2-0.9 UNIT/ML-% IJ SOLN
INTRAMUSCULAR | Status: AC
  Filled 2015-10-30: qty 1000

## 2015-10-30 MED ORDER — VITAMIN C 500 MG PO TABS
2000.0000 mg | ORAL_TABLET | Freq: Every day | ORAL | Status: DC
  Administered 2015-10-31: 2000 mg via ORAL
  Filled 2015-10-30: qty 4

## 2015-10-30 MED ORDER — FENTANYL CITRATE (PF) 100 MCG/2ML IJ SOLN
INTRAMUSCULAR | Status: AC
  Filled 2015-10-30: qty 2

## 2015-10-30 MED ORDER — SODIUM CHLORIDE 0.9 % IV SOLN: INTRAVENOUS | Status: DC

## 2015-10-30 MED ORDER — HEPARIN SODIUM (PORCINE) 5000 UNIT/ML IJ SOLN
4000.0000 [IU] | INTRAMUSCULAR | Status: AC
  Administered 2015-10-30: 4000 [IU] via INTRAVENOUS

## 2015-10-30 MED ORDER — ONDANSETRON HCL 4 MG/2ML IJ SOLN: 4.0000 mg | Freq: Four times a day (QID) | INTRAMUSCULAR | Status: DC | PRN

## 2015-10-30 MED ORDER — LIDOCAINE HCL (PF) 1 % IJ SOLN
INTRAMUSCULAR | Status: DC | PRN
  Administered 2015-10-30: 2 mL

## 2015-10-30 MED ORDER — MIDAZOLAM HCL 2 MG/2ML IJ SOLN
INTRAMUSCULAR | Status: AC
  Filled 2015-10-30: qty 2

## 2015-10-30 MED ORDER — HEPARIN SODIUM (PORCINE) 1000 UNIT/ML IJ SOLN
INTRAMUSCULAR | Status: AC
  Filled 2015-10-30: qty 1

## 2015-10-30 MED ORDER — SODIUM CHLORIDE 0.9 % IJ SOLN: 3.0000 mL | INTRAMUSCULAR | Status: DC | PRN

## 2015-10-30 MED ORDER — SODIUM CHLORIDE 0.9 % IV SOLN
INTRAVENOUS | Status: DC | PRN
  Administered 2015-10-30: 71 mL/h via INTRAVENOUS

## 2015-10-30 MED ORDER — MIDAZOLAM HCL 2 MG/2ML IJ SOLN
INTRAMUSCULAR | Status: DC | PRN
  Administered 2015-10-30: 2 mg via INTRAVENOUS

## 2015-10-30 MED ORDER — VERAPAMIL HCL 2.5 MG/ML IV SOLN
INTRAVENOUS | Status: AC
  Filled 2015-10-30: qty 2

## 2015-10-30 MED ORDER — ADULT MULTIVITAMIN W/MINERALS CH
1.0000 | ORAL_TABLET | Freq: Every day | ORAL | Status: DC
  Administered 2015-10-31: 1 via ORAL
  Filled 2015-10-30: qty 1

## 2015-10-30 MED ORDER — SODIUM CHLORIDE 0.9 % IV SOLN: 250.0000 mL | INTRAVENOUS | Status: DC | PRN

## 2015-10-30 MED ORDER — ACETAMINOPHEN 325 MG PO TABS
650.0000 mg | ORAL_TABLET | ORAL | Status: DC | PRN
  Administered 2015-10-30: 650 mg via ORAL
  Filled 2015-10-30: qty 2

## 2015-10-30 MED ORDER — HEPARIN (PORCINE) IN NACL 2-0.9 UNIT/ML-% IJ SOLN
INTRAMUSCULAR | Status: DC | PRN
  Administered 2015-10-30: 11:00:00

## 2015-10-30 MED ORDER — LIDOCAINE HCL (PF) 1 % IJ SOLN
INTRAMUSCULAR | Status: AC
  Filled 2015-10-30: qty 30

## 2015-10-30 MED ORDER — ASPIRIN 81 MG PO CHEW
81.0000 mg | CHEWABLE_TABLET | Freq: Every day | ORAL | Status: DC
  Administered 2015-10-31: 81 mg via ORAL
  Filled 2015-10-30 (×2): qty 1

## 2015-10-30 MED ORDER — OMEGA-3-ACID ETHYL ESTERS 1 G PO CAPS
2.0000 g | ORAL_CAPSULE | Freq: Every day | ORAL | Status: DC
  Administered 2015-10-31: 2 g via ORAL
  Filled 2015-10-30: qty 2

## 2015-10-30 MED ORDER — MAGNESIUM 250 MG PO TABS: 1.0000 | ORAL_TABLET | Freq: Every day | ORAL | Status: DC

## 2015-10-30 MED ORDER — FENTANYL CITRATE (PF) 100 MCG/2ML IJ SOLN
INTRAMUSCULAR | Status: DC | PRN
  Administered 2015-10-30: 50 ug via INTRAVENOUS

## 2015-10-30 MED ORDER — IBUPROFEN 400 MG PO TABS
400.0000 mg | ORAL_TABLET | Freq: Four times a day (QID) | ORAL | Status: DC | PRN
  Administered 2015-10-30: 400 mg via ORAL
  Filled 2015-10-30: qty 1

## 2015-10-30 MED ORDER — MAGNESIUM GLUCONATE 500 MG PO TABS
250.0000 mg | ORAL_TABLET | Freq: Every day | ORAL | Status: DC
  Administered 2015-10-31: 250 mg via ORAL
  Filled 2015-10-30: qty 1

## 2015-10-30 MED ORDER — OMEGA-3 FATTY ACIDS 1000 MG PO CAPS: 2.0000 g | ORAL_CAPSULE | Freq: Every day | ORAL | Status: DC

## 2015-10-30 SURGICAL SUPPLY — 11 items

## 2015-10-30 NOTE — ED Notes (Signed)
Pt placed on zoll. Transported to Cath Lab 5

## 2015-10-30 NOTE — Progress Notes (Signed)
Patient with oozing from rt radial site with with first deflation per protocol. Air injected 3cc, per protocol and oozing stopped will retry deflation in 30 minutes and monitor patient. Minah Axelrod, Randall AnKristin Jessup RN

## 2015-10-30 NOTE — H&P (Addendum)
Patient ID: Trevor [name] MRN: [id number] DOB/AGE: 05-06-1946 37 y.o. Admit date: 10/30/2015  Primary Cardiologist: New  HPI: 70 yo male with history of HLD who called EMS this am with c/o chest pain x 1 day. The pain has been left sided with no radiation, on/off for last 18 hours. No prior cardiac issues. EKG with ST elevation anterior leads. Code STEMI called by EMS. Pt is currently chest pain free. He has no other complaints.   Review of systems complete and found to be negative unless listed above   Past Medical History  Diagnosis Date  . Hyperlipidemia   . Gilbert syndrome   . Colonic polyp 2004    Dr. Kinnie Scales; hyperplastic polyp  . Palpitation 2002    ER  visit    Family History  Problem Relation Age of Onset  . Stroke Father 62  . Alcohol abuse Father   . Breast cancer Mother   . HIV Brother   . Prostate cancer Brother 91  . Breast cancer Maternal Aunt   . Breast cancer Maternal Grandmother   . Heart disease Neg Hx   . Diabetes Neg Hx     Social History   Social History  . Marital Status: Married    Spouse Name: N/A  . Number of Children: N/A  . Years of Education: N/A   Occupational History  . Not on file.   Social History Main Topics  . Smoking status: Former Smoker    Quit date: 10/29/1978  . Smokeless tobacco: Not on file     Comment: smoked 1967-1980, up to 1 ppd  . Alcohol Use: No  . Drug Use: Not on file  . Sexual Activity: Not on file   Other Topics Concern  . Not on file   Social History Narrative    Past Surgical History  Procedure Laterality Date  . Knee arthroscopy  1984    right  . Colonoscopy w/ polypectomy  2004    @ age 64, Dr Kinnie Scales. ? Hyperplastic polyp  . Actinic keratoses      facial removed x 2, Dr Danella Deis  . Flexible sigmoidoscopy  1999    negative  . Basal cell cancer  2013 & 2015    Dr Campbell Stall  . Colonoscopy  2014    negative    Allergies  Allergen Reactions  . Codeine     Mental status changes  .  Niacin     flushing    Prior to Admission Meds:  Prior to Admission medications   Medication Sig Start Date End Date Taking? Authorizing Provider  Ascorbic Acid (VITAMIN C PO) Take 2,000 mg by mouth.     Historical Provider, MD  aspirin 81 MG tablet Take 81 mg by mouth daily.      Historical Provider, MD  Cholecalciferol (VITAMIN D) 2000 UNITS CAPS Take 1 capsule by mouth daily.    Historical Provider, MD  fish oil-omega-3 fatty acids 1000 MG capsule Take 2 g by mouth daily.    Historical Provider, MD  Magnesium 250 MG TABS Take 1 tablet by mouth daily.    Historical Provider, MD  metroNIDAZOLE (METROCREAM) 0.75 % cream  11/01/14   Historical Provider, MD  Multiple Vitamin (MULTIVITAMIN) tablet Take 1 tablet by mouth daily.      Historical Provider, MD  VENTOLIN HFA 108 (90 BASE) MCG/ACT inhaler INHALE 1 TO 2 PUFFS 15 TO 30 MINUTES PRE EXERCISE. 02/18/15   Pecola Lawless, MD  Physical Exam: Blood pressure 135/79, pulse 94, resp. rate 16, SpO2 96 %.    General: Well developed, well nourished, NAD  HEENT: OP clear, mucus membranes moist  SKIN: warm, dry. No rashes.  Neuro: No focal deficits  Musculoskeletal: Muscle strength 5/5 all ext  Psychiatric: Mood and affect normal  Neck: No JVD, no carotid bruits, no thyromegaly, no lymphadenopathy.  Lungs:Clear bilaterally, no wheezes, rhonci, crackles  Cardiovascular: Regular rate and rhythm. No murmurs, gallops or rubs.  Abdomen:Soft. Bowel sounds present. Non-tender.  Extremities: No lower extremity edema. Pulses are 2 + in the bilateral DP/PT.   Labs: pending  EKG: sinus, ST elevation precordial leads. Subtle ST elevation lateral leads.   ASSESSMENT AND PLAN:   1. Acute chest pain: Pt presented to ED with c/o chest pain, positional. Given eKG changes and clinical presentation, emergent cardiac cath performed which showed minimal CAD. HIs presentation may be consistent with pericarditis. Will arrange echo today to exclude pericardial  effusion. Will start statin for mild CAD. Will check d-dimer to exclude PE. Monitor on telemetry unit overnight.    Earney Hamburghris McAlhany, MD 10/30/2015, 10:24 AM

## 2015-10-30 NOTE — ED Provider Notes (Signed)
CSN: [id number]     Arrival date & time 10/30/15  1006 History   None    Chief Complaint  Patient presents with  . Chest Pain     (Consider location/radiation/quality/duration/timing/severity/associated sxs/prior Treatment) Patient is a 70 y.o. male presenting with chest pain. The history is provided by the patient and the EMS personnel.  Chest Pain Associated symptoms: no abdominal pain, no back pain, no fever, no headache, no shortness of breath and not vomiting   Patient with dull pain mid chest in the past day. Onset at rest. Dull ache, mid sternal area. Pain moderate, persistent, although improved/resolved at this moment (ems notes c/o chest pain during transport to ED).  Non radiating. Mild sob. No nv or diaphoresis. No recent exertional cp or discomfort. No cough or uri c/o. No fever or chills. No pleuritic pain. Denies leg pain or swelling. Denies chest wall injury or strain.   Past Medical History  Diagnosis Date  . Hyperlipidemia   . Gilbert syndrome   . Colonic polyp 2004    Dr. Kinnie Scales; hyperplastic polyp  . Palpitation 2002    ER  visit   Past Surgical History  Procedure Laterality Date  . Knee arthroscopy  1984    right  . Colonoscopy w/ polypectomy  2004    @ age 68, Dr Kinnie Scales. ? Hyperplastic polyp  . Actinic keratoses      facial removed x 2, Dr Danella Deis  . Flexible sigmoidoscopy  1999    negative  . Basal cell cancer  2013 & 2015    Dr Campbell Stall  . Colonoscopy  2014    negative   Family History  Problem Relation Age of Onset  . Stroke Father 33  . Alcohol abuse Father   . Breast cancer Mother   . HIV Brother   . Prostate cancer Brother 50  . Breast cancer Maternal Aunt   . Breast cancer Maternal Grandmother   . Heart disease Neg Hx   . Diabetes Neg Hx    Social History  Substance Use Topics  . Smoking status: Former Smoker    Quit date: 10/29/1978  . Smokeless tobacco: None     Comment: smoked 1967-1980, up to 1 ppd  . Alcohol Use: No     Review of Systems  Constitutional: Negative for fever.  HENT: Negative for sore throat.   Eyes: Negative for redness.  Respiratory: Negative for shortness of breath.   Cardiovascular: Positive for chest pain. Negative for leg swelling.  Gastrointestinal: Negative for vomiting and abdominal pain.  Genitourinary: Negative for flank pain.  Musculoskeletal: Negative for back pain and neck pain.  Skin: Negative for rash.  Neurological: Negative for headaches.  Hematological: Does not bruise/bleed easily.  Psychiatric/Behavioral: Negative for confusion.      Allergies  Codeine and Niacin  Home Medications   Prior to Admission medications   Medication Sig Start Date End Date Taking? Authorizing Provider  Ascorbic Acid (VITAMIN C PO) Take 2,000 mg by mouth.     Historical Provider, MD  aspirin 81 MG tablet Take 81 mg by mouth daily.      Historical Provider, MD  Cholecalciferol (VITAMIN D) 2000 UNITS CAPS Take 1 capsule by mouth daily.    Historical Provider, MD  fish oil-omega-3 fatty acids 1000 MG capsule Take 2 g by mouth daily.    Historical Provider, MD  Magnesium 250 MG TABS Take 1 tablet by mouth daily.    Historical Provider, MD  metroNIDAZOLE (METROCREAM) 0.75 %  cream  11/01/14   Historical Provider, MD  Multiple Vitamin (MULTIVITAMIN) tablet Take 1 tablet by mouth daily.      Historical Provider, MD  VENTOLIN HFA 108 (90 BASE) MCG/ACT inhaler INHALE 1 TO 2 PUFFS 15 TO 30 MINUTES PRE EXERCISE. 02/18/15   Pecola Lawless, MD   BP 135/79 mmHg  Pulse 94  Resp 16  SpO2 96% Physical Exam  Constitutional: He is oriented to person, place, and time. He appears well-developed and well-nourished. No distress.  HENT:  Head: Atraumatic.  Eyes: Conjunctivae are normal. No scleral icterus.  Neck: Neck supple. No tracheal deviation present.  Cardiovascular: Normal rate, regular rhythm, normal heart sounds and intact distal pulses.  Exam reveals no gallop and no friction rub.   No  murmur heard. Pulmonary/Chest: Effort normal and breath sounds normal. No accessory muscle usage. No respiratory distress. He exhibits tenderness.  Abdominal: Soft. He exhibits no distension. There is no tenderness.  Musculoskeletal: Normal range of motion. He exhibits no edema or tenderness.  Neurological: He is alert and oriented to person, place, and time.  Skin: Skin is warm and dry. He is not diaphoretic.  Psychiatric: He has a normal mood and affect.  Nursing note and vitals reviewed.   ED Course  Procedures (including critical care time) Labs Review  Results for orders placed or performed during the hospital encounter of 10/30/15  APTT  Result Value Ref Range   aPTT 27 24 - 37 seconds  CBC  Result Value Ref Range   WBC 10.4 4.0 - 10.5 K/uL   RBC 4.49 4.22 - 5.81 MIL/uL   Hemoglobin 13.9 13.0 - 17.0 g/dL   HCT 40.9 81.1 - 91.4 %   MCV 90.6 78.0 - 100.0 fL   MCH 31.0 26.0 - 34.0 pg   MCHC 34.2 30.0 - 36.0 g/dL   RDW 78.2 95.6 - 21.3 %   Platelets 242 150 - 400 K/uL  Comprehensive metabolic panel  Result Value Ref Range   Sodium 135 135 - 145 mmol/L   Potassium 4.6 3.5 - 5.1 mmol/L   Chloride 102 101 - 111 mmol/L   CO2 24 22 - 32 mmol/L   Glucose, Bld 128 (H) 65 - 99 mg/dL   BUN 17 6 - 20 mg/dL   Creatinine, Ser 0.86 0.61 - 1.24 mg/dL   Calcium 8.9 8.9 - 57.8 mg/dL   Total Protein 6.9 6.5 - 8.1 g/dL   Albumin 3.8 3.5 - 5.0 g/dL   AST 48 (H) 15 - 41 U/L   ALT 28 17 - 63 U/L   Alkaline Phosphatase 53 38 - 126 U/L   Total Bilirubin 2.5 (H) 0.3 - 1.2 mg/dL   GFR calc non Af Amer >60 >60 mL/min   GFR calc Af Amer >60 >60 mL/min   Anion gap 9 5 - 15  Protime-INR  Result Value Ref Range   Prothrombin Time 14.0 11.6 - 15.2 seconds   INR 1.06 0.00 - 1.49  POCT i-Stat troponin I  Result Value Ref Range   Troponin i, poc 0.01 0.00 - 0.08 ng/mL   Comment 3             I have personally reviewed and evaluated these images and lab results as part of my medical  decision-making.   EKG Interpretation   Date/Time:  Sunday October 30 2015 10:09:46 EST Ventricular Rate:  97 PR Interval:  51 QRS Duration: 86 QT Interval:  396 QTC Calculation: 503 R Axis:  51 Text Interpretation:  Sinus rhythm Consider left ventricular hypertrophy  Prolonged QT interval Baseline wander in lead(s) V1 `st elevation in  v2>v3, is new as compared to prior, remote, ecg - concern for possible  stemi Poor data quality Confirmed by [facility]  MD, Donelle Hise ([id number]) on 10/30/2015  10:19:04 AM      MDM   Iv ns. Continuous pulse ox and monitor.    Pt called a code stemi pta.  Although ecg not classic for stemi, ecg is changed from remote, prior ecg w st elev v2/v3.  Cardiology evaluated patient in ED, and decided to take patient emergently to cath lab.  Reviewed nursing notes and prior charts for additional history.     Cathren LaineKevin Calea Hribar, MD 10/30/15 1110

## 2015-10-30 NOTE — ED Notes (Signed)
To ED via [facility] for eval of cp. Pt brought from [facility]. Pt states pain started yesterday. Difficulty sleeping last pm. Skin w/d. resp e/u. Pt speaking with EDP and Cardiology. 324mg  ASA and 1 nitro given pta

## 2015-10-31 ENCOUNTER — Inpatient Hospital Stay (HOSPITAL_COMMUNITY): Payer: Medicare Other

## 2015-10-31 ENCOUNTER — Encounter (HOSPITAL_COMMUNITY): Payer: Self-pay | Admitting: Physician Assistant

## 2015-10-31 DIAGNOSIS — I309 Acute pericarditis, unspecified: Secondary | ICD-10-CM | POA: Diagnosis present

## 2015-10-31 DIAGNOSIS — I2510 Atherosclerotic heart disease of native coronary artery without angina pectoris: Secondary | ICD-10-CM | POA: Diagnosis present

## 2015-10-31 DIAGNOSIS — R079 Chest pain, unspecified: Secondary | ICD-10-CM

## 2015-10-31 DIAGNOSIS — R072 Precordial pain: Secondary | ICD-10-CM

## 2015-10-31 LAB — BASIC METABOLIC PANEL
Anion gap: 10 (ref 5–15)
BUN: 15 mg/dL (ref 6–20)
CALCIUM: 8.9 mg/dL (ref 8.9–10.3)
CO2: 25 mmol/L (ref 22–32)
CREATININE: 0.87 mg/dL (ref 0.61–1.24)
Chloride: 103 mmol/L (ref 101–111)
GFR calc Af Amer: >60 mL/min (ref >60)
GLUCOSE: 113 mg/dL — AB (ref 65–99)
POTASSIUM: 4.1 mmol/L (ref 3.5–5.1)
SODIUM: 138 mmol/L (ref 135–145)

## 2015-10-31 LAB — LIPID PANEL
CHOL/HDL RATIO: 3.5 ratio
Cholesterol: 207 mg/dL — ABNORMAL HIGH (ref 0–200)
HDL: 59 mg/dL (ref >40)
LDL Cholesterol: 135 mg/dL — ABNORMAL HIGH (ref 0–99)
Triglycerides: 64 mg/dL (ref <150)
VLDL: 13 mg/dL (ref 0–40)

## 2015-10-31 LAB — TROPONIN I: TROPONIN I: 0.03 ng/mL (ref <0.031)

## 2015-10-31 MED ORDER — IBUPROFEN 800 MG PO TABS
800.0000 mg | ORAL_TABLET | Freq: Three times a day (TID) | ORAL | Status: DC
  Administered 2015-10-31: 800 mg via ORAL
  Filled 2015-10-31: qty 1

## 2015-10-31 MED ORDER — ROSUVASTATIN CALCIUM 10 MG PO TABS: 10.0000 mg | ORAL_TABLET | Freq: Every day | ORAL | Status: DC

## 2015-10-31 MED ORDER — ROSUVASTATIN CALCIUM 10 MG PO TABS
10.0000 mg | ORAL_TABLET | Freq: Every day | ORAL | Status: DC
  Filled 2015-10-31: qty 1

## 2015-10-31 MED ORDER — IBUPROFEN 800 MG PO TABS: 800.0000 mg | ORAL_TABLET | Freq: Three times a day (TID) | ORAL | Status: DC

## 2015-10-31 MED ORDER — PANTOPRAZOLE SODIUM 40 MG PO TBEC: 40.0000 mg | DELAYED_RELEASE_TABLET | Freq: Every day | ORAL | Status: DC

## 2015-10-31 MED ORDER — PANTOPRAZOLE SODIUM 40 MG PO TBEC
40.0000 mg | DELAYED_RELEASE_TABLET | Freq: Every day | ORAL | Status: DC
  Administered 2015-10-31: 40 mg via ORAL
  Filled 2015-10-31: qty 1

## 2015-10-31 NOTE — Progress Notes (Signed)
  Echocardiogram 2D Echocardiogram has been performed.  Janalyn HarderWest, Sharnee Douglass R 10/31/2015, 8:36 AM

## 2015-10-31 NOTE — Care Management Note (Signed)
Case Management Note Donn PieriniKristi Sequoia Mincey RN, BSN Unit 2W-Case Manager [phone]  Patient Details  Name: Trevor GeroldFrederick M [name] MRN: [id number] Date of Birth: 10/04/1946  Subjective/Objective:   Pt admitted with chest pain, ST changes on EKG, acute pericarditis                  Action/Plan: PTA Pt lived at home, anticipate return home- CM to follow for any d/c needs  Expected Discharge Date:     10/31/15             Expected Discharge Plan:  Home/Self Care  In-House Referral:     Discharge planning Services  CM Consult  Post Acute Care Choice:    Choice offered to:     DME Arranged:    DME Agency:     HH Arranged:    HH Agency:     Status of Service:  Completed, signed off  Medicare Important Message Given:    Date Medicare IM Given:    Medicare IM give by:    Date Additional Medicare IM Given:    Additional Medicare Important Message give by:     If discussed at Long Length of Stay Meetings, dates discussed:    Additional Comments:  Darrold SpanWebster, Lorissa Kishbaugh Hall, RN 10/31/2015, 11:05 AM

## 2015-10-31 NOTE — Discharge Summary (Signed)
Discharge Summary   Patient ID: Trevor [name] MRN: [id number], DOB/AGE: 03-27-1946 70 y.o. Admit date: 10/30/2015 D/C date:     10/31/2015  Primary Cardiologist: Dr. Sanjuana Kava  Principal Problem:   Acute pericarditis Active Problems:   Hyperlipidemia   BPH without obstruction/lower urinary tract symptoms   Chest pain   CAD (coronary artery disease)    Admission Dates: 10/30/15-10/30/14 Discharge Diagnosis: Suspected acute pericarditis s/p LHC with non obst CAD   HPI: Trevor [name] is a 70 y.o. male with a history of HLD and no prior cardiac history who presented to [facility] on 10/30/15 as a CODE STEMI called by EMS.    Hospital Course  He presented to ED with c/o positional chest pain. Given EKG changes and clinical presentation, emergent cardiac cath performed which showed minimal CAD. Troponin negative x 3. D-dimer negative. Echo reviewed by Dr. Sanjuana Kava this am with normal LV systolic function, no evidence of pericardial effusion ( not yet formally read ). He was started on Crestor 10 mg daily with evidence of CAD and LDL 135. It was decided to treat this as pericarditis with ST changes and chest pain that is positional/pleuritic. He was started on Ibuprofen 800 mg po TID for 2 weeks as well as Protonix 40 mg daily. If he does not have resolution of symptoms with this plan, we can try colchicine.    The patient has had an uncomplicated hospital course and is recovering well. The radial catheter site is stable. He has been seen by Dr. Sanjuana Kava today and deemed ready for discharge home. Staff message to arrange follow-up appointments has been sent. Discharge medications are listed below.   Discharge Vitals: Blood pressure 116/67, pulse 62, temperature 98.3 F (36.8 C), temperature source Oral, resp. rate 18, height 5\' 7"  (1.702 m), weight 157 lb (71.215 kg), SpO2 98 %.  Labs: Lab Results  Component Value Date   WBC 8.7 10/30/2015   HGB 12.6* 10/30/2015   HCT 36.8*  10/30/2015   MCV 90.6 10/30/2015   PLT 215 10/30/2015     Recent Labs Lab 10/30/15 1010 10/31/15 0225  NA 135 138  K 4.6 4.1  CL 102 103  CO2 24 25  BUN 17 15  CREATININE 0.94 0.87  CALCIUM 8.9 8.9  PROT 6.9  --   BILITOT 2.5*  --   ALKPHOS 53  --   ALT 28  --   AST 48*  --   GLUCOSE 128* 113*    Recent Labs  10/30/15 1138 10/30/15 1713 10/30/15 2305  TROPONINI <0.03 0.03 0.03   Lab Results  Component Value Date   CHOL 207* 10/31/2015   HDL 59 10/31/2015   LDLCALC 135* 10/31/2015   TRIG 64 10/31/2015   Lab Results  Component Value Date   DDIMER 0.38 10/30/2015    Diagnostic Studies/Procedures   Dg Chest Port 1 View  10/30/2015  CLINICAL DATA:  Chest pain starting yesterday. EXAM: PORTABLE CHEST 1 VIEW COMPARISON:  01/27/2015. FINDINGS: 1552 hours. The lungs are clear wiithout focal pneumonia, edema, pneumothorax or pleural effusion. Cardiopericardial silhouette is at upper limits of normal for size. The visualized bony structures of the thorax are intact. Telemetry leads overlie the chest. IMPRESSION: No acute cardiopulmonary findings. Electronically Signed   By: Kennith Center M.D.   On: 10/30/2015 16:03    Discharge Medications     Medication List    TAKE these medications        aspirin 81 MG tablet  Take 81 mg by mouth daily.     fish oil-omega-3 fatty acids 1000 MG capsule  Take 1 g by mouth daily.     ibuprofen 800 MG tablet  Commonly known as:  ADVIL,MOTRIN  Take 1 tablet (800 mg total) by mouth 3 (three) times daily.     Magnesium 250 MG Tabs  Take 250 mg by mouth daily.     metroNIDAZOLE 0.75 % cream  Commonly known as:  METROCREAM  Apply 1 application topically daily.     multivitamin tablet  Take 1 tablet by mouth daily.     pantoprazole 40 MG tablet  Commonly known as:  PROTONIX  Take 1 tablet (40 mg total) by mouth daily.     rosuvastatin 10 MG tablet  Commonly known as:  CRESTOR  Take 1 tablet (10 mg total) by mouth daily.       VENTOLIN HFA 108 (90 Base) MCG/ACT inhaler  Generic drug:  albuterol  INHALE 1 TO 2 PUFFS 15 TO 30 MINUTES PRE EXERCISE.     VITAMIN C PO  Take 2,000 mg by mouth daily.     Vitamin D 2000 units Caps  Take 2,000 Units by mouth daily.        Disposition   The patient will be discharged in stable condition to home.  Follow-up Information    Follow up with Verne CarrowMCALHANY,Tyrae Alcoser, MD.   Specialty:  Cardiology   Why:  The office will call you to make an appoinment., If you do not hear from them, please contact them., You should be seen in 2 weeks.   Contact information:   [address] [place] [address] [phone]         Duration of Discharge Encounter: Greater than 30 minutes including physician and PA time.  SignedCline Crock, THOMPSON, KATHRYN R PA-C 10/31/2015, 9:54 AM   I have personally seen and examined this patient. I agree with the assessment and plan as outlined above.   Nazim Kadlec 11/01/2015 10:14 AM

## 2015-10-31 NOTE — Discharge Instructions (Signed)
Pericarditis °Pericarditis is swelling (inflammation) of the pericardium. The pericardium is a thin, double-layered, fluid-filled tissue sac that surrounds the heart. The purpose of the pericardium is to contain the heart in the chest cavity and keep the heart from overexpanding. Different types of pericarditis can occur, such as: °· Acute pericarditis. Inflammation can develop suddenly in acute pericarditis. °· Chronic pericarditis. Inflammation develops gradually and is long-lasting in chronic pericarditis. °· Constrictive pericarditis. In this type of pericarditis, the layers of the pericardium stiffen and develop scar tissue. The scar tissue thickens and sticks together. This makes it difficult for the heart to pump and work as it normally does. °CAUSES  °Pericarditis can be caused from different conditions, such as: °· A bacterial, fungal or viral infection. °· After a heart attack (myocardial infarction). °· After open-heart surgery (coronary bypass graft surgery). °· Auto-immune conditions such as lupus, rheumatoid arthritis or scleroderma. °· Kidney failure. °· Low thyroid condition (hypothyroidism). °· Cancer from another part of the body that has spread (metastasized) to the pericardium. °· Chest injury or trauma. °· After radiation treatment. °· Certain medicines. °SYMPTOMS  °Symptoms of pericarditis can include: °· Chest pain. Chest pain symptoms may increase when laying down and may be relieved when sitting up and leaning forward. °· A chronic, dry cough. °· Heart palpitations. These may feel like rapid, fluttering or pounding heart beats. °· Chest pain may be worse when swallowing. °· Dizziness or fainting. °· Tiredness, fatigue or lethargy. °· Fever. °DIAGNOSIS  °Pericarditis is diagnosed by the following: °· A physical exam. A heart sound called a pericardial friction rub may be heard when your caregiver listens to your heart. °· Blood work. Blood may be drawn to check for an infection and to look at  your blood chemistry. °· Electrocardiography. During electrocardiography your heart's electrical activity is monitored and recorded with a tracing on paper (electrocardiogram [ECG]). °· Echocardiography. °· Computed tomography (CT). °· Magnetic resonance image (MRI). °TREATMENT  °To treat pericarditis, it is important to know the cause of it. The cause of pericarditis determines the treatment.  °· If the cause of pericarditis is due to an infection, treatment is based on the type of infection. If an infection is suspected in the pericardial fluid, a procedure called a pericardial fluid culture and biopsy may be done. This takes a sample of the pericardial fluid. The sample is sent to a lab which runs tests on the pericardial fluid to check for an infection. °· If the autoimmune disease is the cause, treatment of the autoimmune condition will help improve the pericarditis. °· If the cause of pericarditis is not known, anti-inflammatory medicines may be used to help decrease the inflammation. °· Surgery may be needed. The following are types of surgeries or procedures that may be done to treat pericarditis: °¨ Pericardial window. A pericardial window makes a cut (incision) into the pericardial sac. This allows excess fluid in the pericardium to drain. °¨ Pericardiocentesis. A pericardiocentesis is also known as a pericardial tap. This procedure uses a needle that is guided by X-ray to drain (aspirate) excess fluid from the pericardium. °¨ Pericardiectomy. A pericardiectomy removes part or all of the pericardium. °HOME CARE INSTRUCTIONS  °· Do not smoke. If you smoke, quit. Your caregiver can help you quit smoking. °· Maintain a healthy weight. °· Follow an exercise program as directed by your health care provider. You may need to limit your exercising until your symptoms go away. °· If you drink alcohol, do so in moderation. °·   Eat a heart healthy diet. A registered dietitian can help you learn about healthy food  choices. °· Keep a list of all your medicines with you at all times. Include the name, dose, how often it is taken and how it is taken. °SEEK IMMEDIATE MEDICAL CARE IF:  °· You have chest pain or feelings of chest pressure. °· You have sweating (diaphoresis) when at rest. °· You have irregular heartbeats (palpitations). °· You have rapid, racing heart beats. °· You have unexplained fainting episodes. °· You feel sick to your stomach (nausea) or vomiting without cause. °· You have unexplained weakness. °If you develop any of the symptoms which originally made you seek care, call for local emergency medical help. Do not drive yourself to the hospital. °  °This information is not intended to replace advice given to you by your health care provider. Make sure you discuss any questions you have with your health care provider. °  °Document Released: 04/10/2001 Document Revised: 03/01/2015 Document Reviewed: 04/27/2015 °Elsevier Interactive Patient Education ©2016 [employer] ° °

## 2015-10-31 NOTE — Progress Notes (Signed)
Utilization review completed.  

## 2015-10-31 NOTE — Progress Notes (Signed)
     SUBJECTIVE: Mild chest pain with inspiration. No SOB.   Tele: sinus  BP 116/67 mmHg  Pulse 62  Temp(Src) 98.3 F (36.8 C) (Oral)  Resp 18  Ht 5\' 7"  (1.702 m)  Wt 157 lb (71.215 kg)  BMI 24.58 kg/m2  SpO2 98%  Intake/Output Summary (Last 24 hours) at 10/31/15 0841 Last data filed at 10/30/15 1733  Gross per 24 hour  Intake    240 ml  Output      0 ml  Net    240 ml    PHYSICAL EXAM General: Well developed, well nourished, in no acute distress. Alert and oriented x 3.  Psych:  Good affect, responds appropriately Neck: No JVD. No masses noted.  Lungs: Clear bilaterally with no wheezes or rhonci noted.  Heart: RRR with no murmurs noted. Abdomen: Bowel sounds are present. Soft, non-tender.  Extremities: No lower extremity edema.   LABS: Basic Metabolic Panel:  Recent Labs  16/07/9600/01/17 1010 10/31/15 0225  NA 135 138  K 4.6 4.1  CL 102 103  CO2 24 25  GLUCOSE 128* 113*  BUN 17 15  CREATININE 0.94 0.87  CALCIUM 8.9 8.9   CBC:  Recent Labs  10/30/15 1010 10/30/15 2305  WBC 10.4 8.7  HGB 13.9 12.6*  HCT 40.7 36.8*  MCV 90.6 90.6  PLT 242 215   Cardiac Enzymes:  Recent Labs  10/30/15 1138 10/30/15 1713 10/30/15 2305  TROPONINI <0.03 0.03 0.03   Fasting Lipid Panel:  Recent Labs  10/31/15 0225  CHOL 207*  HDL 59  LDLCALC 135*  TRIG 64  CHOLHDL 3.5    Current Meds: . aspirin  81 mg Oral Daily  . cholecalciferol  2,000 Units Oral Daily  . magnesium gluconate  250 mg Oral Daily  . multivitamin with minerals  1 tablet Oral Daily  . omega-3 acid ethyl esters  2 g Oral Daily  . sodium chloride  3 mL Intravenous Q12H  . vitamin C  2,000 mg Oral Daily     ASSESSMENT AND PLAN:  1. Chest pain/CAD/Possible pericarditis: Cardiac cath with mild CAD. Troponin negative x 3. D-dimer negative. Echo reviewed by me this am with normal LV systolic function, no evidence of pericardial effusion.  Will start Crestor 10 mg daily with evidence of CAD and  LDL 135. Will treat this as pericarditis with ST changes, chest pain that is positional/pleuritic. Will start Ibuprofen 800 mg po TID for 2 weeks. Will start Protonix 40 mg daily. If he does not have resolution of symptoms with this plan, can try colchicine. Discharge home today. Follow up with me or office APP in 2 weeks.   Tiras Bianchini  1/2/20178:41 AM

## 2015-11-01 ENCOUNTER — Encounter (HOSPITAL_COMMUNITY): Payer: Self-pay | Admitting: Cardiovascular Disease

## 2015-11-01 ENCOUNTER — Telehealth: Payer: Self-pay | Admitting: Cardiovascular Disease

## 2015-11-01 NOTE — Telephone Encounter (Signed)
I have spoken with schedulers and they have him in list to contact for post hospital appt.

## 2015-11-01 NOTE — Telephone Encounter (Signed)
Spoke with pt. He reports he is feeling well and would like to wait to see Dr. Clifton JamesMcAlhany. I told him Dr. Clifton JamesMcAlhany often recommends Dr. Sanda Lingerhomas Jones or any of the providers at [facility].

## 2015-11-01 NOTE — Telephone Encounter (Signed)
Pt has been scheduled for appt with Dr. Clifton JamesMcAlhany on January 26,2017. If he wants sooner appt he can be scheduled with NP or PA.  I placed call to pt and left message to call back

## 2015-11-01 NOTE — Telephone Encounter (Signed)
New Message   Pt calling post hospital he states that Dr. Clifton JamesMcAlhany agreed to be his cardiologist   and to have a follow up with him in two weeks  Pt also wants him to recommend a PCP he use to see Dr.Hopps

## 2015-11-02 ENCOUNTER — Telehealth: Payer: Self-pay | Admitting: Cardiovascular Disease

## 2015-11-02 ENCOUNTER — Telehealth: Payer: Self-pay | Admitting: Internal Medicine

## 2015-11-02 NOTE — Telephone Encounter (Signed)
yes

## 2015-11-02 NOTE — Telephone Encounter (Signed)
OK to resume running this weekend. Thayer Ohmhris

## 2015-11-02 NOTE — Telephone Encounter (Signed)
Pt.notified

## 2015-11-02 NOTE — Telephone Encounter (Signed)
Pt request to transfer from Dr. Alwyn RenHopper to Dr. Yetta BarreJones. Pt state Dr. Clifton JamesMcalhany recommend Dr. Yetta BarreJones. Please advise.   Phone# [phone]

## 2015-11-02 NOTE — Telephone Encounter (Signed)
New message     Pt recently had a cath.  It was normal.  He forgot to ask if he can resume running?  Please call

## 2015-11-14 ENCOUNTER — Ambulatory Visit (INDEPENDENT_AMBULATORY_CARE_PROVIDER_SITE_OTHER): Payer: Medicare Other | Admitting: Internal Medicine

## 2015-11-14 ENCOUNTER — Encounter: Payer: Self-pay | Admitting: Internal Medicine

## 2015-11-14 VITALS — BP 102/60 | HR 73 | Temp 98.0°F | Resp 16 | Ht 67.0 in | Wt 159.0 lb

## 2015-11-14 DIAGNOSIS — I2583 Coronary atherosclerosis due to lipid rich plaque: Principal | ICD-10-CM

## 2015-11-14 DIAGNOSIS — I2510 Atherosclerotic heart disease of native coronary artery without angina pectoris: Secondary | ICD-10-CM

## 2015-11-14 DIAGNOSIS — E785 Hyperlipidemia, unspecified: Secondary | ICD-10-CM

## 2015-11-14 MED ORDER — ROSUVASTATIN CALCIUM 10 MG PO TABS: 10.0000 mg | ORAL_TABLET | Freq: Every day | ORAL | Status: DC

## 2015-11-14 NOTE — Progress Notes (Signed)
Pre visit review using our clinic review tool, if applicable. No additional management support is needed unless otherwise documented below in the visit note. 

## 2015-11-14 NOTE — Progress Notes (Signed)
Subjective:  Patient ID: Trevor [name], male    DOB: May 28, 1946  Age: 70 y.o. MRN: [id number]  CC: Hyperlipidemia and Coronary Artery Disease  NEW TO ME  HPI Trevor [name] presents for follow-up after a recent episode of chest pain. He was seen in the hospital and was diagnosed with pericarditis. He also had a cardiac catheterization that showed a 30% blockage in one of his vessels. The chest pain has resolved. He feels well today and offers no new complaints.  History Trevor [name] has a past medical history of Hyperlipidemia; Gilbert syndrome; Colonic polyp; Acute pericarditis; and CAD (coronary artery disease).   He has past surgical history that includes Knee arthroscopy (1984); Colonoscopy w/ polypectomy (2004); actinic keratoses; Flexible sigmoidoscopy (1999); basal cell cancer (2013 & 2015); Colonoscopy (2014); and Cardiac catheterization (N/A, 10/30/2015).   His family history includes Alcohol abuse in his father; Breast cancer in his maternal aunt, maternal grandmother, and mother; HIV in his brother; Prostate cancer (age of onset: 48) in his brother; Stroke (age of onset: 33) in his father. There is no history of Heart disease or Diabetes.He reports that he quit smoking about 37 years ago. He does not have any smokeless tobacco history on file. He reports that he does not drink alcohol. His drug history is not on file.  Outpatient Prescriptions Prior to Visit  Medication Sig Dispense Refill  . Ascorbic Acid (VITAMIN C PO) Take 2,000 mg by mouth daily.     Marland Kitchen aspirin 81 MG tablet Take 81 mg by mouth daily.      . Cholecalciferol (VITAMIN D) 2000 UNITS CAPS Take 2,000 Units by mouth daily.     . fish oil-omega-3 fatty acids 1000 MG capsule Take 1 g by mouth daily.     . Magnesium 250 MG TABS Take 250 mg by mouth daily.     . metroNIDAZOLE (METROCREAM) 0.75 % cream Apply 1 application topically daily.    . Multiple Vitamin (MULTIVITAMIN) tablet Take 1 tablet by mouth daily.       . VENTOLIN HFA 108 (90 BASE) MCG/ACT inhaler INHALE 1 TO 2 PUFFS 15 TO 30 MINUTES PRE EXERCISE. 18 g 0  . ibuprofen (ADVIL,MOTRIN) 800 MG tablet Take 1 tablet (800 mg total) by mouth 3 (three) times daily. 30 tablet 42  . pantoprazole (PROTONIX) 40 MG tablet Take 1 tablet (40 mg total) by mouth daily. 14 tablet 0  . rosuvastatin (CRESTOR) 10 MG tablet Take 1 tablet (10 mg total) by mouth daily. (Patient not taking: Reported on 11/14/2015) 30 tablet 6   No facility-administered medications prior to visit.    ROS Review of Systems  Constitutional: Negative.  Negative for fever, chills, diaphoresis, appetite change and fatigue.  HENT: Negative.   Eyes: Negative.   Respiratory: Negative.  Negative for cough, choking, chest tightness, shortness of breath and stridor.   Cardiovascular: Negative.  Negative for chest pain, palpitations and leg swelling.  Gastrointestinal: Negative.  Negative for nausea, vomiting, abdominal pain, diarrhea and constipation.  Endocrine: Negative.   Genitourinary: Negative.   Musculoskeletal: Negative.  Negative for myalgias, back pain, arthralgias and neck pain.  Skin: Negative.  Negative for color change and rash.  Allergic/Immunologic: Negative.   Neurological: Negative.  Negative for dizziness, syncope and weakness.  Hematological: Negative.  Negative for adenopathy. Does not bruise/bleed easily.  Psychiatric/Behavioral: Negative.     Objective:  BP 102/60 mmHg  Pulse 73  Temp(Src) 98 F (36.7 C) (Oral)  Resp 16  Ht  5\' 7"  (1.702 m)  Wt 159 lb (72.122 kg)  BMI 24.90 kg/m2  SpO2 95%  Physical Exam  Constitutional: He is oriented to person, place, and time. He appears well-developed and well-nourished. No distress.  HENT:  Head: Normocephalic and atraumatic.  Mouth/Throat: Oropharynx is clear and moist. No oropharyngeal exudate.  Eyes: Conjunctivae are normal. Right eye exhibits no discharge. Left eye exhibits no discharge. No scleral icterus.    Neck: Normal range of motion. Neck supple. No JVD present. No tracheal deviation present. No thyromegaly present.  Cardiovascular: Normal rate, regular rhythm, normal heart sounds and intact distal pulses.  Exam reveals no gallop and no friction rub.   No murmur heard. Pulmonary/Chest: Effort normal and breath sounds normal. No stridor. No respiratory distress. He has no wheezes. He has no rales. He exhibits no tenderness.  Abdominal: Soft. Bowel sounds are normal. He exhibits no distension and no mass. There is no tenderness. There is no rebound and no guarding.  Musculoskeletal: Normal range of motion. He exhibits no edema or tenderness.  Lymphadenopathy:    He has no cervical adenopathy.  Neurological: He is oriented to person, place, and time.  Skin: Skin is warm and dry. No rash noted. He is not diaphoretic. No erythema. No pallor.  Vitals reviewed.   Lab Results  Component Value Date   WBC 8.7 10/30/2015   HGB 12.6* 10/30/2015   HCT 36.8* 10/30/2015   PLT 215 10/30/2015   GLUCOSE 113* 10/31/2015   CHOL 207* 10/31/2015   TRIG 64 10/31/2015   HDL 59 10/31/2015   LDLDIRECT 159.2 08/31/2013   LDLCALC 135* 10/31/2015   ALT 28 10/30/2015   AST 48* 10/30/2015   NA 138 10/31/2015   K 4.1 10/31/2015   CL 103 10/31/2015   CREATININE 0.87 10/31/2015   BUN 15 10/31/2015   CO2 25 10/31/2015   TSH 2.69 09/12/2015   PSA 0.69 09/12/2015   INR 1.06 10/30/2015    Assessment & Plan:   Trevor [name] was seen today for hyperlipidemia and coronary artery disease.  Diagnoses and all orders for this visit:  Coronary artery disease due to lipid rich plaque -     rosuvastatin (CRESTOR) 10 MG tablet; Take 1 tablet (10 mg total) by mouth daily.  Hyperlipidemia with target LDL less than 70 -     rosuvastatin (CRESTOR) 10 MG tablet; Take 1 tablet (10 mg total) by mouth daily.  I have discontinued Trevor [name]'s VENTOLIN HFA, ibuprofen, and pantoprazole. I am also having him maintain his  aspirin, multivitamin, Ascorbic Acid (VITAMIN C PO), Magnesium, Vitamin D, fish oil-omega-3 fatty acids, metroNIDAZOLE, and rosuvastatin.  Meds ordered this encounter  Medications  . rosuvastatin (CRESTOR) 10 MG tablet    Sig: Take 1 tablet (10 mg total) by mouth daily.    Dispense:  90 tablet    Refill:  3     Follow-up: Return in about 6 months (around 05/13/2016).  Sanda Lingerhomas Chianna Spirito, MD

## 2015-11-14 NOTE — Patient Instructions (Signed)

## 2015-11-23 NOTE — Progress Notes (Signed)
Chief Complaint  Patient presents with  . Follow-up    post hospital, pt has no complaints  . Coronary Artery Disease      History of Present Illness: 70 yo male with history of HLD, CAD, pericarditis here today for hospital follow up. He was admitted to [facility] 10/30/15 with chest pain and acute ST changes. Emergent cardiac cath with mild disease mid RCA but no flow limiting lesions. Echo 10/31/15 with normal LV systolic function, moderate LVH, trivial AI. He was treated with an NSAID for possible acute pericarditis. A PPI was added while he was on daily NSAIDS.   He is here today for follow up. No chest pain or SOB. No palpitations. He feels great. Tolerating statin.   Primary Care Physician: Sanda Linger, MD   Past Medical History  Diagnosis Date  . Hyperlipidemia   . Gilbert syndrome   . Colonic polyp     Dr. Kinnie Scales; hyperplastic polyp  . Acute pericarditis     a. admitted as a code STEMI but no sig CAD on cath.   . CAD (coronary artery disease)     30% stenosis mid RCA    Past Surgical History  Procedure Laterality Date  . Knee arthroscopy  1984    right  . Colonoscopy w/ polypectomy  2004    @ age 72, Dr Kinnie Scales. ? Hyperplastic polyp  . Actinic keratoses      facial removed x 2, Dr Danella Deis  . Flexible sigmoidoscopy  1999    negative  . Basal cell cancer  2013 & 2015    Dr Campbell Stall  . Colonoscopy  2014    negative  . Cardiac catheterization N/A 10/30/2015    Procedure: Left Heart Cath and Coronary Angiography;  Surgeon: Kathleene Hazel, MD;  Location: [facility] INVASIVE CV LAB;  Service: Cardiovascular;  Laterality: N/A;    Current Outpatient Prescriptions  Medication Sig Dispense Refill  . Ascorbic Acid (VITAMIN C PO) Take 2,000 mg by mouth daily.     Marland Kitchen aspirin 81 MG tablet Take 81 mg by mouth daily.      . Cholecalciferol (VITAMIN D) 2000 UNITS CAPS Take 2,000 Units by mouth daily.     . fish oil-omega-3 fatty acids 1000 MG capsule Take 1 g by mouth daily.       . Magnesium 250 MG TABS Take 250 mg by mouth daily.     . metroNIDAZOLE (METROCREAM) 0.75 % cream Apply 1 application topically daily.    . Multiple Vitamin (MULTIVITAMIN) tablet Take 1 tablet by mouth daily.      . rosuvastatin (CRESTOR) 10 MG tablet Take 1 tablet (10 mg total) by mouth daily. 90 tablet 3   No current facility-administered medications for this visit.    Allergies  Allergen Reactions  . Codeine Anaphylaxis    Mental status changes  . Niacin Other (See Comments)    flushing    Social History   Social History  . Marital Status: Married    Spouse Name: N/A  . Number of Children: N/A  . Years of Education: N/A   Occupational History  . Not on file.   Social History Main Topics  . Smoking status: Former Smoker    Quit date: 10/29/1978  . Smokeless tobacco: Not on file     Comment: smoked 1967-1980, up to 1 ppd  . Alcohol Use: No  . Drug Use: Not on file  . Sexual Activity: Not on file   Other Topics  Concern  . Not on file   Social History Narrative    Family History  Problem Relation Age of Onset  . Stroke Father 33  . Alcohol abuse Father   . Breast cancer Mother   . HIV Brother   . Prostate cancer Brother 91  . Breast cancer Maternal Aunt   . Breast cancer Maternal Grandmother   . Heart disease Neg Hx   . Diabetes Neg Hx     Review of Systems:  As stated in the HPI and otherwise negative.   BP 110/70 mmHg  Pulse 60  Ht  (1.702 m)  Wt 158 lb 12.8 oz (72.031 kg)  BMI 24.87 kg/m2  SpO2 99%  Physical Examination: General: Well developed, well nourished, NAD HEENT: OP clear, mucus membranes moist SKIN: warm, dry. No rashes. Neuro: No focal deficits Musculoskeletal: Muscle strength 5/5 all ext Psychiatric: Mood and affect normal Neck: No JVD, no carotid bruits, no thyromegaly, no lymphadenopathy. Lungs:Clear bilaterally, no wheezes, rhonci, crackles Cardiovascular: Regular rate and rhythm. No murmurs, gallops or  rubs. Abdomen:Soft. Bowel sounds present. Non-tender.  Extremities: No lower extremity edema. Pulses are 2 + in the bilateral DP/PT.  Cardiac cath 10/30/15: Coronary Findings    Dominance: Right   Left Anterior Descending  . Vessel is large. Vessel is angiographically normal.   . First Diagonal Branch   The vessel is moderate in size and is angiographically normal.   . Second Diagonal Branch   The vessel is small in size.     Left Circumflex  . Vessel is moderate in size. Vessel is angiographically normal.   . First Obtuse Marginal Branch   The vessel is moderate in size and is angiographically normal.     Right Coronary Artery  . Vessel is large.   . Mid RCA lesion, 30% stenosed. Discrete.   . Right Posterior Descending Artery   The vessel is moderate in size.      Wall Motion                 Coronary Diagrams    Diagnostic Diagram         Echo 10/31/15: Left ventricle: The cavity size was normal. Wall thickness was increased in a pattern of moderate LVH. Systolic function was normal. The estimated ejection fraction was in the range of 60% to 65%. GLPSS -19% - normal. Wall motion was normal; there were no regional wall motion abnormalities. Doppler parameters are consistent with abnormal left ventricular relaxation (grade 1 diastolic dysfunction). The E/e&' ratio is <8, suggesting normal LV filling pressure. - Aortic valve: Trileaflet. Sclerosis without stenosis. There was trivial regurgitation. - Mitral valve: Mildly thickened leaflets . - Left atrium: The atrium was normal in size. - Right atrium: The atrium was mildly dilated. - Inferior vena cava: The vessel was normal in size. The respirophasic diameter changes were in the normal range (>= 50%), consistent with normal central venous pressure.  Impressions:  - LVEF 60-65%, moderate LVH, normal wall motion, normal GLPSS, diastolic dysfunction by TDI with normal LV filling  pressure, trivial AI, mild RAE.  EKG:  EKG is not ordered today. The ekg ordered today demonstrates   Recent Labs: 09/12/2015: TSH 2.69 10/30/2015: ALT 28; Hemoglobin 12.6*; Platelets 215 10/31/2015: BUN 15; Creatinine, Ser 0.87; Potassium 4.1; Sodium 138   Lipid Panel    Component Value Date/Time   CHOL 207* 10/31/2015 0225   TRIG 64 10/31/2015 0225   HDL 59 10/31/2015 0225   CHOLHDL 3.5  10/31/2015 0225   VLDL 13 10/31/2015 0225   LDLCALC 135* 10/31/2015 0225   LDLDIRECT 159.2 08/31/2013 0946     Wt Readings from Last 3 Encounters:  11/24/15 158 lb 12.8 oz (72.031 kg)  11/14/15 159 lb (72.122 kg)  10/30/15 157 lb (71.215 kg)     Other studies Reviewed: Additional studies/ records that were reviewed today include: . Review of the above records demonstrates:   Assessment and Plan:   1. Acute pericarditis: Resolved. No further chest pain. He completed his course of NSAIDs.   2. CAD: He has mild disease in the mid RCA. Continue ASA and statin. Will check lipids and LFTs in 8 weeks.   Current medicines are reviewed at length with the patient today.  The patient does not have concerns regarding medicines.  The following changes have been made:  no change  Labs/ tests ordered today include:   Orders Placed This Encounter  Procedures  . Lipid Profile  . Hepatic function panel    Disposition:   FU with me  in 12  months  Signed, Verne Carrow, MD 11/24/2015 8:56 AM    [facility] [address] [place], [place], [place]  [address] Phone: [phone]; Fax: [phone]

## 2015-11-24 ENCOUNTER — Encounter: Payer: Self-pay | Admitting: Cardiovascular Disease

## 2015-11-24 ENCOUNTER — Ambulatory Visit (INDEPENDENT_AMBULATORY_CARE_PROVIDER_SITE_OTHER): Payer: Medicare Other | Admitting: Cardiovascular Disease

## 2015-11-24 VITALS — BP 110/70 | HR 60 | Ht 67.0 in | Wt 158.8 lb

## 2015-11-24 DIAGNOSIS — I309 Acute pericarditis, unspecified: Secondary | ICD-10-CM

## 2015-11-24 DIAGNOSIS — I2510 Atherosclerotic heart disease of native coronary artery without angina pectoris: Secondary | ICD-10-CM

## 2015-11-24 NOTE — Patient Instructions (Signed)
Medication Instructions:  Your physician recommends that you continue on your current medications as directed. Please refer to the Current Medication list given to you today.   Labwork: Your physician recommends that you return for fasting lab work on March 23,2017.  The lab opens at 7:30 AM--The lipid and liver profiles   Testing/Procedures: none  Follow-Up: Your physician wants you to follow-up in: 12 months.  You will receive a reminder letter in the mail two months in advance. If you don't receive a letter, please call our office to schedule the follow-up appointment.   Any Other Special Instructions Will Be Listed Below (If Applicable).     If you need a refill on your cardiac medications before your next appointment, please call your pharmacy.

## 2016-01-19 ENCOUNTER — Other Ambulatory Visit (INDEPENDENT_AMBULATORY_CARE_PROVIDER_SITE_OTHER): Payer: Medicare Other | Admitting: *Deleted

## 2016-01-19 DIAGNOSIS — I2510 Atherosclerotic heart disease of native coronary artery without angina pectoris: Secondary | ICD-10-CM

## 2016-01-19 LAB — LIPID PANEL
CHOL/HDL RATIO: 2.5 ratio (ref <=5.0)
CHOLESTEROL: 130 mg/dL (ref 125–200)
HDL: 52 mg/dL (ref >=40)
LDL Cholesterol: 65 mg/dL (ref <130)
Triglycerides: 64 mg/dL (ref <150)
VLDL: 13 mg/dL (ref <30)

## 2016-01-19 LAB — HEPATIC FUNCTION PANEL
ALT: 23 U/L (ref 9–46)
AST: 30 U/L (ref 10–35)
Albumin: 4 g/dL (ref 3.6–5.1)
Alkaline Phosphatase: 40 U/L (ref 40–115)
BILIRUBIN DIRECT: 0.3 mg/dL — AB (ref <=0.2)
BILIRUBIN INDIRECT: 1.4 mg/dL — AB (ref 0.2–1.2)
BILIRUBIN TOTAL: 1.7 mg/dL — AB (ref 0.2–1.2)
Total Protein: 6.6 g/dL (ref 6.1–8.1)

## 2016-01-20 ENCOUNTER — Telehealth: Payer: Self-pay | Admitting: Cardiovascular Disease

## 2016-01-20 NOTE — Telephone Encounter (Signed)
Spoke with pt and reviewed lipid and liver profiles with him.

## 2016-01-20 NOTE — Telephone Encounter (Signed)
°  Follow Up   Pt returning call regarding test results. Please call.

## 2016-01-23 ENCOUNTER — Encounter: Payer: Self-pay | Admitting: Cardiovascular Disease

## 2016-01-24 ENCOUNTER — Telehealth: Payer: Self-pay | Admitting: Cardiovascular Disease

## 2016-01-24 DIAGNOSIS — E785 Hyperlipidemia, unspecified: Secondary | ICD-10-CM

## 2016-01-24 DIAGNOSIS — I2510 Atherosclerotic heart disease of native coronary artery without angina pectoris: Secondary | ICD-10-CM

## 2016-01-24 DIAGNOSIS — I2583 Coronary atherosclerosis due to lipid rich plaque: Principal | ICD-10-CM

## 2016-01-24 NOTE — Telephone Encounter (Signed)
[name] BiblePat, See email. He is going to reduce his Lipitor to 10 mg mon, wed, Friday. He will need lipids checked in 12 weeks. No LFTS needed. Thanks, chris

## 2016-01-25 MED ORDER — ROSUVASTATIN CALCIUM 10 MG PO TABS: ORAL_TABLET | ORAL | Status: DC

## 2016-01-25 NOTE — Addendum Note (Signed)
Addended by: Dossie ArbourADELMAN, Uziah Sorter L on: 01/25/2016 08:34 AM   Modules accepted: Orders

## 2016-01-25 NOTE — Telephone Encounter (Signed)
Chart updated. Labs scheduled for June 21,2017. Message sent to pt through my chart.  Med list indicates pt is taking Rosuvastatin.

## 2016-03-14 ENCOUNTER — Encounter: Payer: Self-pay | Admitting: Internal Medicine

## 2016-03-14 ENCOUNTER — Other Ambulatory Visit: Payer: Self-pay | Admitting: Internal Medicine

## 2016-03-14 DIAGNOSIS — J45990 Exercise induced bronchospasm: Secondary | ICD-10-CM | POA: Insufficient documentation

## 2016-04-18 ENCOUNTER — Ambulatory Visit (INDEPENDENT_AMBULATORY_CARE_PROVIDER_SITE_OTHER): Payer: Medicare Other | Admitting: Pulmonary Disease

## 2016-04-18 ENCOUNTER — Other Ambulatory Visit (INDEPENDENT_AMBULATORY_CARE_PROVIDER_SITE_OTHER): Payer: Medicare Other | Admitting: *Deleted

## 2016-04-18 ENCOUNTER — Telehealth: Payer: Self-pay | Admitting: Pulmonary Disease

## 2016-04-18 ENCOUNTER — Encounter: Payer: Self-pay | Admitting: Pulmonary Disease

## 2016-04-18 VITALS — BP 104/66 | HR 57 | Ht 67.0 in | Wt 159.6 lb

## 2016-04-18 DIAGNOSIS — R0600 Dyspnea, unspecified: Secondary | ICD-10-CM | POA: Diagnosis not present

## 2016-04-18 DIAGNOSIS — E785 Hyperlipidemia, unspecified: Secondary | ICD-10-CM

## 2016-04-18 LAB — LIPID PANEL
CHOL/HDL RATIO: 2.9 ratio (ref <=5.0)
Cholesterol: 158 mg/dL (ref 125–200)
HDL: 54 mg/dL (ref >=40)
LDL CALC: 89 mg/dL (ref <130)
TRIGLYCERIDES: 74 mg/dL (ref <150)
VLDL: 15 mg/dL (ref <30)

## 2016-04-18 LAB — HEPATIC FUNCTION PANEL
ALBUMIN: 4 g/dL (ref 3.6–5.1)
ALT: 18 U/L (ref 9–46)
AST: 30 U/L (ref 10–35)
Alkaline Phosphatase: 43 U/L (ref 40–115)
BILIRUBIN TOTAL: 1.5 mg/dL — AB (ref 0.2–1.2)
Bilirubin, Direct: 0.3 mg/dL — ABNORMAL HIGH (ref <=0.2)
Indirect Bilirubin: 1.2 mg/dL (ref 0.2–1.2)
TOTAL PROTEIN: 6.1 g/dL (ref 6.1–8.1)

## 2016-04-18 NOTE — Progress Notes (Signed)
Subjective:    Patient ID: Trevor [name], male    DOB: [birth year], 70 y.o.   MRN: [id number]  HPI He start running in 2012-2013. He reports he has had dyspnea for approximately 1 year ago. He reports he notices that this starts shortly after he starts running but is able to run through this. He reports at the end of his run he has his usual fatigue but no significantly increased dyspnea. Reports minimal coughing. He reports some mild wheezing at the start of his run. No dyspnea, cough, or wheezing with exposure to perfumes, odors, or other exertion. No chest pain or pressure. No breathing problems as a child or young adult. Denies any history of bronchitis. No significant allergies other than cats. No rashes or bruising. No significant sinus congestion or pressure. He reports rare, intermittent reflux that is relieved with intermittent Tums. He reports using a Ventolin inhaler in the beginning does seem to help compared to when he doesn't use it.   Review of Systems No joint pain, swelling, or erythema. No fever, chills, or sweats. A pertinent 14 point review of systems is negative except as per the history of presenting illness.  Allergies  Allergen Reactions  . Codeine Anaphylaxis    Mental status changes  . Niacin Other (See Comments)    flushing    Current Outpatient Prescriptions on File Prior to Visit  Medication Sig Dispense Refill  . Ascorbic Acid (VITAMIN C PO) Take 2,000 mg by mouth daily.     Marland Kitchen. aspirin 81 MG tablet Take 81 mg by mouth daily.      . Cholecalciferol (VITAMIN D) 2000 UNITS CAPS Take 2,000 Units by mouth daily.     . fish oil-omega-3 fatty acids 1000 MG capsule Take 1 g by mouth daily.     . Magnesium 250 MG TABS Take 250 mg by mouth daily.     . metroNIDAZOLE (METROCREAM) 0.75 % cream Apply 1 application topically daily.    . Multiple Vitamin (MULTIVITAMIN) tablet Take 1 tablet by mouth daily.      . rosuvastatin (CRESTOR) 10 MG tablet Take one tablet by  mouth on Mon, Wed and Fri 45 tablet 3   No current facility-administered medications on file prior to visit.    Past Medical History  Diagnosis Date  . Hyperlipidemia   . Gilbert syndrome   . Colonic polyp     Dr. Kinnie ScalesMedoff; hyperplastic polyp  . Acute pericarditis     a. admitted as a code STEMI but no sig CAD on cath.   . CAD (coronary artery disease)     30% stenosis mid RCA    Past Surgical History  Procedure Laterality Date  . Knee arthroscopy  1984    right  . Colonoscopy w/ polypectomy  2004    @ age 70, Dr Kinnie ScalesMedoff. ? Hyperplastic polyp  . Actinic keratoses      facial removed x 2, Dr Danella DeisGruber  . Flexible sigmoidoscopy  1999    negative  . Basal cell cancer  2013 & 2015    Dr Campbell StallHope Gruber  . Colonoscopy  2014    negative  . Cardiac catheterization N/A 10/30/2015    Procedure: Left Heart Cath and Coronary Angiography;  Surgeon: Kathleene Hazelhristopher D McAlhany, MD;  [facility] INV[facility] CV LAB;  Service: Cardiovascular;  Laterality: N/A;    Family History  Problem Relation Age of Onset  . Stroke Father [id number]  . Alcohol abuse Father   . Breast cancer  Mother   . HIV Brother   . Prostate cancer Brother 42  . Breast cancer Maternal Aunt   . Breast cancer Maternal Grandmother   . Heart disease Neg Hx   . Diabetes Neg Hx   . Asthma Brother     Social History   Social History  . Marital Status: Married    Spouse Name: N/A  . Number of Children: N/A  . Years of Education: N/A   Social History Main Topics  . Smoking status: Former Smoker -- 1.00 packs/day for 13 years    Types: Cigarettes    Quit date: 10/29/1978  . Smokeless tobacco: None     Comment: smoked 1967-1980, up to 1 ppd  . Alcohol Use: No  . Drug Use: No  . Sexual Activity: Not Asked   Other Topics Concern  . None   Social History Narrative   Originally from [place]. Moved to [place] in 1951. He moved to  in 1984. 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  Previously has traveled to [place], [place], [place], [place], [place], and [place]. Currently has a cockatiel. No mold exposure. Has a hot tub but doesn't use it regularly.       Objective:   Physical Exam BP 104/66 mmHg  Pulse 57  Ht  (1.702 m)  Wt 159 lb 9.6 oz (72.394 kg)  BMI 24.99 kg/m2  SpO2 99% General:  Awake. Alert. No acute distress. Fit male..  Integument:  Warm & dry. No rash on exposed skin. No bruising. Lymphatics:  No appreciated cervical or supraclavicular lymphadenoapthy. HEENT:  Moist mucus membranes. No oral ulcers. No scleral injection or icterus.  Cardiovascular:  Regular rate. No edema. No appreciable JVD.  Pulmonary:  Good aeration & clear to auscultation bilaterally. Symmetric chest wall expansion. No accessory muscle use. Abdomen: Soft. Normal bowel sounds. Nondistended. Grossly nontender. Musculoskeletal:  Normal bulk and tone. Hand grip strength 5/5 bilaterally. No joint deformity or effusion appreciated. Neurological:  CN 2-12 grossly in tact. No meningismus. Moving all 4 extremities equally. Symmetric brachioradialis deep tendon reflexes. Psychiatric:  Mood and affect congruent. Speech normal rhythm, rate & tone.   PFT 05/21/11: FVC 4.52 L (110%) FEV1 2.45 L (120%) FEV1/FVC 0.76 FEF 25-35 2.93 L (104%) no bronchodilator response TLC 5.86 L (99%) RV 60% ERV 70% DLCO uncorrected 105%  IMAGING PORT CXR 10/30/15 (personally reviewed by me): No parenchymal nodule or opacity. No pleural effusion. Suggestion of borderline cardiomegaly. Mediastinum normal in contour.  CARDIAC TTE (10/31/15): Moderate LVH with EF 60-65%. Grade 1 diastolic dysfunction. Normal regional wall motion. LA normal in size & RA mildly dilated. RV normal in size and function. Pulmonary artery normal in size. Aortic root normal in size. Trivial aortic regurgitation without stenosis. Trivial mitral regurgitation. No pulmonic regurgitation. No tricuspid regurgitation. No pericardial  effusion.  LHC (10/30/15): 30% Mid-RCA lesion. Mild hypokinesis of apex w/ EF 50%.   LABS 01/19/16 LFT: 4.0/6.6/1.7/40/30/23  10/31/15 BMP: 138/4.1/103/25/15/0.7/113/8.9  10/30/15 CBC: 8.7/12.6/36.8/215    Assessment & Plan:  70 year old male who reports dyspnea occurring primarily within the early part of his daily run for the last year. Patient's symptoms could be consistent with exercise-induced asthma. I do not see any parenchymal abnormalities on his chest x-ray from January. Additionally, his pulmonary function testing from 2012 was completely normal. I feel it's reasonable to repeat full pulmonary function testing as well as obtain records/report from his VO2 test to determine whether or not  cardiopulmonary exercise testing is necessary. I instructed the patient to notify my office if he had any new breathing problems or questions before his next appointment.  1. Dyspnea: Suspect possible exercise-induced asthma. Continuing Ventolin inhaler as needed before running. Checking full pulmonary function testing & obtaining VO2 testing. Consider cardiopulmonary exercise testing depending upon these results versus further imaging and workup. 2. Follow-up: Patient to return to clinic in 4-6 weeks.  Donna Christen Jamison Neighbor, M.D. [facility] Pager:  [phone] After 3pm or if no response, call [phone] 11:22 AM 04/18/2016

## 2016-04-18 NOTE — Telephone Encounter (Signed)
PFT 05/21/11: FVC 4.52 L (110%) FEV1 2.45 L (120%) FEV1/FVC 0.76 FEF 25-35 2.93 L (104%) no bronchodilator response TLC 5.86 L (99%) RV 60% ERV 70% DLCO uncorrected 105%  IMAGING PORT CXR 10/30/15 (personally reviewed by me): No parenchymal nodule or opacity. No pleural effusion. Suggestion of borderline cardiomegaly. Mediastinum normal in contour.  CARDIAC TTE (10/31/15): Moderate LVH with EF 60-65%. Grade 1 diastolic dysfunction. Normal regional wall motion. LA normal in size & RA mildly dilated. RV normal in size and function. Pulmonary artery normal in size. Aortic root normal in size. Trivial aortic regurgitation without stenosis. Trivial mitral regurgitation. No pulmonic regurgitation. No tricuspid regurgitation. No pericardial effusion.  LHC (10/30/15):  30% Mid-RCA lesion. Mild hypokinesis of apex w/ EF 50%.   LABS 01/19/16 LFT: 4.0/6.6/1.7/40/30/23  10/31/15 BMP: 138/4.1/103/25/15/0.7/113/8.9  10/30/15 CBC: 8.7/12.6/36.8/215

## 2016-04-18 NOTE — Patient Instructions (Signed)
   Please let me know if you have any new breathing problems before your next appointment  We will do a breathing test on or before your next appointment in 4-6 weeks.  TESTS ORDERED: 1. Full pulmonary function testing on or before next appointment

## 2016-04-18 NOTE — Addendum Note (Signed)
Addended by: Marvene StaffOX, SHEENA H on: 04/18/2016 11:27 AM   Modules accepted: Orders

## 2016-04-19 ENCOUNTER — Telehealth: Payer: Self-pay | Admitting: Cardiovascular Disease

## 2016-04-19 DIAGNOSIS — I2510 Atherosclerotic heart disease of native coronary artery without angina pectoris: Secondary | ICD-10-CM

## 2016-04-19 DIAGNOSIS — E785 Hyperlipidemia, unspecified: Secondary | ICD-10-CM

## 2016-04-19 DIAGNOSIS — I2583 Coronary atherosclerosis due to lipid rich plaque: Principal | ICD-10-CM

## 2016-04-19 MED ORDER — ROSUVASTATIN CALCIUM 10 MG PO TABS: ORAL_TABLET | ORAL | Status: DC

## 2016-04-19 NOTE — Telephone Encounter (Signed)
Spoke with pt and reviewed lab results and recommendations from Dr. Clifton JamesMcAlhany with him.  Will send updated prescription to [facility] [address]  He will have lab work done in January.

## 2016-04-19 NOTE — Telephone Encounter (Signed)
Fu  Pt returning RN phone call- lab results. Please call back and discuss.   

## 2016-05-04 ENCOUNTER — Other Ambulatory Visit: Payer: Self-pay | Admitting: Internal Medicine

## 2016-06-01 ENCOUNTER — Ambulatory Visit (INDEPENDENT_AMBULATORY_CARE_PROVIDER_SITE_OTHER): Payer: Medicare Other | Admitting: Pulmonary Disease

## 2016-06-01 ENCOUNTER — Encounter: Payer: Self-pay | Admitting: Pulmonary Disease

## 2016-06-01 VITALS — BP 114/60 | HR 71 | Ht 67.0 in | Wt 158.0 lb

## 2016-06-01 DIAGNOSIS — R0600 Dyspnea, unspecified: Secondary | ICD-10-CM

## 2016-06-01 LAB — PULMONARY FUNCTION TEST
DL/VA % pred: 92 %
DL/VA: 4.07 ml/min/mmHg/L
DLCO COR: 23.02 ml/min/mmHg
DLCO UNC % PRED: 76 %
DLCO UNC: 21.61 ml/min/mmHg
DLCO cor % pred: 81 %
FEF 25-75 POST: 3.21 L/s
FEF 25-75 PRE: 2.78 L/s
FEF2575-%Change-Post: 15 %
FEF2575-%PRED-PRE: 121 %
FEF2575-%Pred-Post: 140 %
FEV1-%Change-Post: 3 %
FEV1-%PRED-POST: 111 %
FEV1-%PRED-PRE: 108 %
FEV1-PRE: 3.18 L
FEV1-Post: 3.28 L
FEV1FVC-%Change-Post: 2 %
FEV1FVC-%PRED-PRE: 104 %
FEV6-%CHANGE-POST: 1 %
FEV6-%PRED-POST: 108 %
FEV6-%Pred-Pre: 107 %
FEV6-POST: 4.09 L
FEV6-Pre: 4.02 L
FEV6FVC-%CHANGE-POST: 0 %
FEV6FVC-%PRED-POST: 106 %
FEV6FVC-%Pred-Pre: 105 %
FVC-%Change-Post: 0 %
FVC-%Pred-Post: 102 %
FVC-%Pred-Pre: 102 %
FVC-Post: 4.1 L
FVC-Pre: 4.1 L
PRE FEV1/FVC RATIO: 77 %
Post FEV1/FVC ratio: 80 %
Post FEV6/FVC ratio: 100 %
Pre FEV6/FVC Ratio: 99 %
RV % PRED: 79 %
RV: 1.8 L
TLC % pred: 91 %
TLC: 5.85 L

## 2016-06-01 NOTE — Progress Notes (Signed)
PFT 06/01/16: FVC 4.10 L (102%) FEV1 3.18 L (108%) FEV1/FVC 0.77 FEF 25-75 2.78 L (121%) negative bronchodilator response TLC 5.85 L (91%) RV 79% ERV 106% DLCO corrected 81% (Hgb 12.6)

## 2016-06-01 NOTE — Progress Notes (Signed)
Subjective:    Patient ID: Trevor [name], male    DOB: 06/18/1946, 70 y.o.   MRN: [id number]  C.C.:  Follow-up for Dyspnea.  HPI Dyspnea:  Trevor [name] reports Trevor [name] still has some "gasping" during the initial phases of running but no other significant dyspnea. Trevor [name] is using an albuterol inhaler just prior to running but no new problems. No wheezing or coughing.   Review of Systems No chest pain or tightness. No fever, chills, or sweats. No nausea, emesis or abdominal pain.   Allergies  Allergen Reactions  . Codeine Anaphylaxis    Mental status changes  . Niacin Other (See Comments)    flushing    Current Outpatient Prescriptions on File Prior to Visit  Medication Sig Dispense Refill  . Ascorbic Acid (VITAMIN C PO) Take 2,000 mg by mouth daily.     Marland Kitchen aspirin 81 MG tablet Take 81 mg by mouth daily.      . Cholecalciferol (VITAMIN D) 2000 UNITS CAPS Take 2,000 Units by mouth daily.     . fish oil-omega-3 fatty acids 1000 MG capsule Take 1 g by mouth daily.     . Magnesium 250 MG TABS Take 250 mg by mouth daily.     . metroNIDAZOLE (METROCREAM) 0.75 % cream Apply 1 application topically daily.    . Multiple Vitamin (MULTIVITAMIN) tablet Take 1 tablet by mouth daily.      . rosuvastatin (CRESTOR) 10 MG tablet Take one tablet by mouth 4 days per week (Patient taking differently: Take one tablet by mouth 3 days per week) 50 tablet 3  . VENTOLIN HFA 108 (90 Base) MCG/ACT inhaler INHALE 1 TO 2 PUFFS INTO THE LUNGS EVERY 15 TO 30 MINUTES PRIOR TO EXERCISE 18 g 0   No current facility-administered medications on file prior to visit.     Past Medical History:  Diagnosis Date  . Acute pericarditis    a. admitted as a code STEMI but no sig CAD on cath.   . CAD (coronary artery disease)    30% stenosis mid RCA  . Colonic polyp    Dr. Kinnie Scales; hyperplastic polyp  . Gilbert syndrome   . Hyperlipidemia     Past Surgical History:  Procedure Laterality Date  . actinic keratoses     facial  removed x 2, Dr Danella Deis  . basal cell cancer  2013 & 2015   Dr Campbell Stall  . CARDIAC CATHETERIZATION N/A 10/30/2015   Procedure: Left Heart Cath and Coronary Angiography;  Surgeon: Kathleene Hazel, MD;  Location: [facility] INVASIVE CV LAB;  Service: Cardiovascular;  Laterality: N/A;  . COLONOSCOPY  2014   negative  . COLONOSCOPY W/ POLYPECTOMY  2004   @ age 36, Dr Kinnie Scales. ? Hyperplastic polyp  . FLEXIBLE SIGMOIDOSCOPY  1999   negative  . KNEE ARTHROSCOPY  1984   right    Family History  Problem Relation Age of Onset  . Stroke Father 31  . Alcohol abuse Father   . Breast cancer Mother   . HIV Brother   . Prostate cancer Brother 32  . Breast cancer Maternal Aunt   . Breast cancer Maternal Grandmother   . Heart disease Neg Hx   . Diabetes Neg Hx   . Asthma Brother     Social History   Social History  . Marital status: Married    Spouse name: N/A  . Number of children: N/A  . Years of education: N/A   Social History Main Topics  .  Smoking status: Former Smoker    Packs/day: 1.00    Years: 13.00    Types: Cigarettes    Quit date: 10/29/1978  . Smokeless tobacco: None     Comment: smoked 1967-1980, up to 1 ppd  . Alcohol use No  . Drug use: No  . Sexual activity: Not Asked   Other Topics Concern  . None   Social History Narrative   Originally from [place]. Moved to [place] in 1951. Trevor [name] moved to [place] in 1984. 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 Previously has traveled to [place], [place], [place], [place], [place], and [place]. Currently has a cockatiel. No mold exposure. Has a hot tub but doesn't use it regularly.       Objective:   Physical Exam BP 114/60 (BP Location: Left Arm, Cuff Size: Normal)   Pulse 71   Ht 5\' 7"  (1.702 m)   Wt 158 lb (71.7 kg)   SpO2 97%   BMI 24.75 kg/m  General:  Awake. Alert. No distress. Fit male..  Integument:  Warm & dry. No rash on exposed skin.  Lymphatics:  No appreciated  cervical or supraclavicular lymphadenoapthy. HEENT:  Moist mucus membranes. No oral ulcers. No scleral injection or icterus.  Cardiovascular:  Regular rate. No edema. No appreciable JVD.  Pulmonary:  Faint basilar crackles right greater than left. Accessory muscle use on room air. Speaking in complete sentences. Abdomen: Soft. Normal bowel sounds. Nontender.  PFT 06/01/16: FVC 4.10 L (102%) FEV1 3.18 L (108%) FEV1/FVC 0.77 FEF 25-75 2.78 L (121%) negative bronchodilator response TLC 5.85 L (91%) RV 79% ERV 106% DLCO corrected 81% (Hgb 12.6) 05/21/11: FVC 4.52 L (110%) FEV1 2.45 L (120%) FEV1/FVC 0.76 FEF 25-35 2.93 L (104%) negative bronchodilator response TLC 5.86 L (99%) RV 60% ERV 70% DLCO uncorrected 105%  IMAGING PORT CXR 10/30/15 (previously reviewed by me): No parenchymal nodule or opacity. No pleural effusion. Suggestion of borderline cardiomegaly. Mediastinum normal in contour.  CARDIAC TTE (10/31/15): Moderate LVH with EF 60-65%. Grade 1 diastolic dysfunction. Normal regional wall motion. LA normal in size & RA mildly dilated. RV normal in size and function. Pulmonary artery normal in size. Aortic root normal in size. Trivial aortic regurgitation without stenosis. Trivial mitral regurgitation. No pulmonic regurgitation. No tricuspid regurgitation. No pericardial effusion.  LHC (10/30/15): 30% Mid-RCA lesion. Mild hypokinesis of apex w/ EF 50%.   LABS 01/19/16 LFT: 4.0/6.6/1.7/40/30/23  10/31/15 BMP: 138/4.1/103/25/15/0.7/113/8.9  10/30/15 CBC: 8.7/12.6/36.8/215    Assessment & Plan:  70 y.o. male with dyspnea.His abnormal chest sensation occurs at the onset of his exercise/running regimen. Trevor [name] has no other new symptoms. We did discuss at length his basilar crackles found on physical exam today which could represent early interstitial lung disease. We also discussed the potential for further worsening of pulmonary fibrosis and subsequent pulmonary function. His pulmonary function testing  today is completely normal with significant improvement in his spirometry as well as remarkable stability in his TLC. When combined with his normal carbon monoxide diffusion capacity I am somewhat skeptical that Trevor [name] has underlying interstitial lung disease but have no other reason for the findings on his physical exam. At this time the patient wishes to follow-up with his primary care physician in November as scheduled and with me as needed. I urged him to ensure that his primary care physician paying particular attention to his lung exam in the basilar portions and if crackles are still present at that time I  would recommend high-resolution CT imaging without contrast to ensure there is no underlying interstitial lung disease.  1. Dyspnea: No significant worsening. Patient to notify me if Trevor [name] has any new symptoms or wishes to proceed with further evaluation depending upon new symptom development and physical examination at his follow-up appointment in November with his primary care physician. 2. Follow-up: Patient to return to clinic on an as-needed basis.  Donna Christen Jamison Neighbor, M.D. [facility] Pager:  [phone] After 3pm or if no response, call [phone] 2:25 PM 06/01/16

## 2016-06-01 NOTE — Patient Instructions (Signed)
   Make sure you pay particular attention to your breathing, running distance, and running time.  If you notice any new cough or breathing problem please call me and I will be happy to see you back.  Make sure your primary care physician pay particular attention to the lung exam in the bases as you may have a very early form of lung fibrosis & scarring.  I will see you back on an as needed basis.

## 2016-09-03 ENCOUNTER — Other Ambulatory Visit (INDEPENDENT_AMBULATORY_CARE_PROVIDER_SITE_OTHER): Payer: Medicare Other

## 2016-09-03 ENCOUNTER — Ambulatory Visit (INDEPENDENT_AMBULATORY_CARE_PROVIDER_SITE_OTHER): Payer: Medicare Other | Admitting: Internal Medicine

## 2016-09-03 ENCOUNTER — Telehealth: Payer: Self-pay

## 2016-09-03 ENCOUNTER — Encounter: Payer: Self-pay | Admitting: Internal Medicine

## 2016-09-03 VITALS — BP 132/78 | HR 54 | Temp 97.5°F | Wt 158.0 lb

## 2016-09-03 DIAGNOSIS — R739 Hyperglycemia, unspecified: Secondary | ICD-10-CM

## 2016-09-03 DIAGNOSIS — D539 Nutritional anemia, unspecified: Secondary | ICD-10-CM

## 2016-09-03 DIAGNOSIS — I2510 Atherosclerotic heart disease of native coronary artery without angina pectoris: Secondary | ICD-10-CM

## 2016-09-03 DIAGNOSIS — E785 Hyperlipidemia, unspecified: Secondary | ICD-10-CM

## 2016-09-03 DIAGNOSIS — N400 Enlarged prostate without lower urinary tract symptoms: Secondary | ICD-10-CM

## 2016-09-03 DIAGNOSIS — Z0000 Encounter for general adult medical examination without abnormal findings: Secondary | ICD-10-CM

## 2016-09-03 DIAGNOSIS — K219 Gastro-esophageal reflux disease without esophagitis: Secondary | ICD-10-CM

## 2016-09-03 DIAGNOSIS — Z1159 Encounter for screening for other viral diseases: Secondary | ICD-10-CM | POA: Insufficient documentation

## 2016-09-03 LAB — COMPREHENSIVE METABOLIC PANEL
ALBUMIN: 4.3 g/dL (ref 3.5–5.2)
ALK PHOS: 44 U/L (ref 39–117)
ALT: 21 U/L (ref 0–53)
AST: 29 U/L (ref 0–37)
BUN: 20 mg/dL (ref 6–23)
CHLORIDE: 103 meq/L (ref 96–112)
CO2: 31 mEq/L (ref 19–32)
Calcium: 9.1 mg/dL (ref 8.4–10.5)
Creatinine, Ser: 1.02 mg/dL (ref 0.40–1.50)
GFR: 77.05 mL/min (ref >60.00)
Glucose, Bld: 93 mg/dL (ref 70–99)
POTASSIUM: 5.2 meq/L — AB (ref 3.5–5.1)
Sodium: 139 mEq/L (ref 135–145)
TOTAL PROTEIN: 6.3 g/dL (ref 6.0–8.3)
Total Bilirubin: 1.8 mg/dL — ABNORMAL HIGH (ref 0.2–1.2)

## 2016-09-03 LAB — LIPID PANEL
CHOLESTEROL: 172 mg/dL (ref 0–200)
HDL: 56.3 mg/dL (ref >39.00)
LDL Cholesterol: 99 mg/dL (ref 0–99)
NONHDL: 115.39
Total CHOL/HDL Ratio: 3
Triglycerides: 81 mg/dL (ref 0.0–149.0)
VLDL: 16.2 mg/dL (ref 0.0–40.0)

## 2016-09-03 LAB — CBC WITH DIFFERENTIAL/PLATELET
BASOS PCT: 0.9 % (ref 0.0–3.0)
Basophils Absolute: 0.1 10*3/uL (ref 0.0–0.1)
EOS PCT: 2.7 % (ref 0.0–5.0)
Eosinophils Absolute: 0.1 10*3/uL (ref 0.0–0.7)
HCT: 39.3 % (ref 39.0–52.0)
HEMOGLOBIN: 13.6 g/dL (ref 13.0–17.0)
LYMPHS ABS: 1.9 10*3/uL (ref 0.7–4.0)
Lymphocytes Relative: 33.3 % (ref 12.0–46.0)
MCHC: 34.6 g/dL (ref 30.0–36.0)
MCV: 91.1 fl (ref 78.0–100.0)
MONOS PCT: 8.6 % (ref 3.0–12.0)
Monocytes Absolute: 0.5 10*3/uL (ref 0.1–1.0)
Neutro Abs: 3 10*3/uL (ref 1.4–7.7)
Neutrophils Relative %: 54.5 % (ref 43.0–77.0)
Platelets: 233 10*3/uL (ref 150.0–400.0)
RBC: 4.31 Mil/uL (ref 4.22–5.81)
RDW: 13.7 % (ref 11.5–15.5)
WBC: 5.6 10*3/uL (ref 4.0–10.5)

## 2016-09-03 LAB — PSA: PSA: 0.95 ng/mL (ref 0.10–4.00)

## 2016-09-03 LAB — RETICULOCYTES
ABS Retic: 38970 cells/uL (ref 25000–90000)
RBC.: 4.33 MIL/uL (ref 4.20–5.80)
Retic Ct Pct: 0.9 %

## 2016-09-03 LAB — IBC PANEL
IRON: 113 ug/dL (ref 42–165)
SATURATION RATIOS: 32.4 % (ref 20.0–50.0)
TRANSFERRIN: 249 mg/dL (ref 212.0–360.0)

## 2016-09-03 LAB — HEMOGLOBIN A1C: HEMOGLOBIN A1C: 5.9 % (ref 4.6–6.5)

## 2016-09-03 LAB — TSH: TSH: 2.72 u[IU]/mL (ref 0.35–4.50)

## 2016-09-03 LAB — VITAMIN B12: VITAMIN B 12: 692 pg/mL (ref 211–911)

## 2016-09-03 LAB — FERRITIN: Ferritin: 25.2 ng/mL (ref 22.0–322.0)

## 2016-09-03 LAB — FOLATE: Folate: >23.4 ng/mL (ref >5.9)

## 2016-09-03 MED ORDER — FAMOTIDINE 40 MG PO TABS: 40.0000 mg | ORAL_TABLET | Freq: Every day | ORAL | 3 refills | Status: DC

## 2016-09-03 MED ORDER — PITAVASTATIN CALCIUM 1 MG PO TABS: 1.0000 | ORAL_TABLET | Freq: Every day | ORAL | 11 refills | Status: DC

## 2016-09-03 NOTE — Telephone Encounter (Signed)
PA APPROVED from 09/03/2016 through 09/03/2017, pharmacy to be notified via CoverMyMeds

## 2016-09-03 NOTE — Patient Instructions (Signed)

## 2016-09-03 NOTE — Progress Notes (Signed)
Subjective:  Patient ID: Trevor [name], male    DOB: December 01, 1945  Age: 70 y.o. MRN: [id number]  CC: Annual Exam; Coronary Artery Disease; Anemia; Hyperlipidemia; and Gastroesophageal Reflux  NEW TO ME  HPI Trevor [name] presents for an AWV/CPX.  He has a history of nonobstructive CAD and was told to take a statin by his cardiologist. He has tried Crestor several times and at several different doses and complains that it causes severe fatigue.  He has a hx of N/N anemia that has not been investigated. He is not aware of any sources of blood loss.  He complains of occasional episode of heartburn and takes Tums frequently. He wants to take something other than Tums to prophylactically prevent the symptoms from developing. He experiences most of his heartburn at bedtime and he denies odynophagia or dysphagia.   Outpatient Medications Prior to Visit  Medication Sig Dispense Refill  . Ascorbic Acid (VITAMIN C PO) Take 2,000 mg by mouth daily.     Marland Kitchen aspirin 81 MG tablet Take 81 mg by mouth daily.      . Cholecalciferol (VITAMIN D) 2000 UNITS CAPS Take 2,000 Units by mouth daily.     . fish oil-omega-3 fatty acids 1000 MG capsule Take 1 g by mouth daily.     . Magnesium 250 MG TABS Take 250 mg by mouth daily.     . metroNIDAZOLE (METROCREAM) 0.75 % cream Apply 1 application topically daily.    . VENTOLIN HFA 108 (90 Base) MCG/ACT inhaler INHALE 1 TO 2 PUFFS INTO THE LUNGS EVERY 15 TO 30 MINUTES PRIOR TO EXERCISE 18 g 0  . Multiple Vitamin (MULTIVITAMIN) tablet Take 1 tablet by mouth daily.      . rosuvastatin (CRESTOR) 10 MG tablet Take one tablet by mouth 4 days per week (Patient taking differently: Take one tablet by mouth 3 days per week) 50 tablet 3   No facility-administered medications prior to visit.     ROS Review of Systems  Constitutional: Positive for fatigue. Negative for appetite change, chills, diaphoresis, fever and unexpected weight change.  HENT: Negative.   Negative for trouble swallowing and voice change.   Eyes: Negative for visual disturbance.  Respiratory: Negative for cough, choking, chest tightness, shortness of breath and stridor.   Cardiovascular: Negative.  Negative for chest pain, palpitations and leg swelling.  Gastrointestinal: Negative.  Negative for abdominal pain, constipation, diarrhea, nausea and vomiting.  Endocrine: Negative.   Genitourinary: Negative for difficulty urinating, dysuria, hematuria, penile pain, penile swelling, scrotal swelling, testicular pain and urgency.  Musculoskeletal: Negative.  Negative for back pain, myalgias and neck pain.  Skin: Negative.  Negative for rash.  Allergic/Immunologic: Negative.   Neurological: Negative.  Negative for dizziness, tremors, speech difficulty, weakness, numbness and headaches.  Hematological: Negative.   Psychiatric/Behavioral: Negative.     Objective:  BP 132/78   Pulse (!) 54   Temp 97.5 F (36.4 C)   Wt 158 lb (71.7 kg)   SpO2 98%   BMI 24.75 kg/m   BP Readings from Last 3 Encounters:  09/03/16 132/78  06/01/16 114/60  04/18/16 104/66    Wt Readings from Last 3 Encounters:  09/03/16 158 lb (71.7 kg)  06/01/16 158 lb (71.7 kg)  04/18/16 159 lb 9.6 oz (72.4 kg)    Physical Exam  Abdominal: Hernia confirmed negative in the right inguinal area and confirmed negative in the left inguinal area.  Genitourinary: Rectum normal, testes normal and penis normal. Rectal exam  shows no external hemorrhoid, no internal hemorrhoid, no fissure, no mass, no tenderness, anal tone normal and guaiac negative stool. Prostate is enlarged (2+ smooth symm BPH). Prostate is not tender. Right testis shows no mass, no swelling and no tenderness. Right testis is descended. Left testis shows no mass, no swelling and no tenderness. Left testis is descended. Circumcised. No penile erythema or penile tenderness. No discharge found.  Lymphadenopathy:       Right: No inguinal adenopathy  present.       Left: No inguinal adenopathy present.    Lab Results  Component Value Date   WBC 5.6 09/03/2016   HGB 13.6 09/03/2016   HCT 39.3 09/03/2016   PLT 233.0 09/03/2016   GLUCOSE 93 09/03/2016   CHOL 172 09/03/2016   TRIG 81.0 09/03/2016   HDL 56.30 09/03/2016   LDLDIRECT 159.2 08/31/2013   LDLCALC 99 09/03/2016   ALT 21 09/03/2016   AST 29 09/03/2016   NA 139 09/03/2016   K 5.2 (H) 09/03/2016   CL 103 09/03/2016   CREATININE 1.02 09/03/2016   BUN 20 09/03/2016   CO2 31 09/03/2016   TSH 2.72 09/03/2016   PSA 0.95 09/03/2016   INR 1.06 10/30/2015   HGBA1C 5.9 09/03/2016    Dg Chest Port 1 View  Result Date: 10/30/2015 CLINICAL DATA:  Chest pain starting yesterday. EXAM: PORTABLE CHEST 1 VIEW COMPARISON:  01/27/2015. FINDINGS: 1552 hours. The lungs are clear wiithout focal pneumonia, edema, pneumothorax or pleural effusion. Cardiopericardial silhouette is at upper limits of normal for size. The visualized bony structures of the thorax are intact. Telemetry leads overlie the chest. IMPRESSION: No acute cardiopulmonary findings. Electronically Signed   By: Kennith CenterEric  Mansell M.D.   On: 10/30/2015 16:03    Assessment & Plan:   Trevor [name] was seen today for annual exam, coronary artery disease, anemia and hyperlipidemia.  Diagnoses and all orders for this visit:  Coronary artery disease involving native coronary artery of native heart without angina pectoris- he is had no recent episodes of angina, I've encouraged him to try to take a statin and the recommended low dose Livalo which is one of the statins that is really well tolerated. -     Lipid panel; Future -     Pitavastatin Calcium (LIVALO) 1 MG TABS; Take 1 tablet (1 mg total) by mouth daily.  Hyperlipidemia with target LDL less than 70- he will stop taking Crestor due to the side effects, I have encouraged him to try low dose Livalo -     Lipid panel; Future -     Comprehensive metabolic panel; Future -     TSH;  Future -     Pitavastatin Calcium (LIVALO) 1 MG TABS; Take 1 tablet (1 mg total) by mouth daily.  BPH without obstruction/lower urinary tract symptoms- his PSA is low so I'm not concerned about prostate cancer, he has no symptoms that need to be treated. -     PSA; Future -     Urinalysis, Routine w reflex microscopic ([facility]); Future  Deficiency anemia- The anemia has resolved and his vitamin levels are normal -     CBC with Differential/Platelet; Future -     Vitamin B12; Future -     IBC panel; Future -     Ferritin; Future -     Folate; Future -     Reticulocytes; Future  Need for hepatitis C screening test -     Hepatitis C antibody; Future  Hyperglycemia- his A1c is slightly elevated at 5.9%, he is mildly prediabetic, no medications are needed at this time. -     Comprehensive metabolic panel; Future -     Hemoglobin A1c; Future  Routine general medical examination at a health care facility  Gastroesophageal reflux disease without esophagitis- will treat with an H2 blocker -     famotidine (PEPCID) 40 MG tablet; Take 1 tablet (40 mg total) by mouth at bedtime.   I have discontinued Trevor [name]'s multivitamin and rosuvastatin. I am also having him start on Pitavastatin Calcium and famotidine. Additionally, I am having him maintain his aspirin, Ascorbic Acid (VITAMIN C PO), Magnesium, Vitamin D, fish oil-omega-3 fatty acids, metroNIDAZOLE, and VENTOLIN HFA.  Meds ordered this encounter  Medications  . Pitavastatin Calcium (LIVALO) 1 MG TABS    Sig: Take 1 tablet (1 mg total) by mouth daily.    Dispense:  30 tablet    Refill:  11  . famotidine (PEPCID) 40 MG tablet    Sig: Take 1 tablet (40 mg total) by mouth at bedtime.    Dispense:  90 tablet    Refill:  3   See AVS for instructions about healthy living and anticipatory guidance.  Follow-up: Return in about 6 months (around 03/03/2017).  Sanda Lingerhomas Malorie Bigford, MD

## 2016-09-03 NOTE — Assessment & Plan Note (Signed)
He refused a flu vaccine today  The written screening recommendations is given to patient and attached in the patent instructions or AVS.   The patient is here for annual Medicare wellness examination and management of other chronic and acute problems.   The risk factors are reflected in the social history.  The roster of all physicians providing medical care to patient - is listed in the Snapshot section of the chart.  Activities of daily living:  The patient is 100% inedpendent in all ADLs: dressing, toileting, feeding as well as independent mobility  Home safety : The patient has smoke detectors in the home. They wear seatbelts.No firearms at home ( firearms are present in the home, kept in a safe fashion). There is no violence in the home.   There is no risks for hepatitis, STDs or HIV. There is no   history of blood transfusion. They have no travel history to infectious disease endemic areas of the world.  The patient has (has not) seen their dentist in the last six month. They have (not) seen their eye doctor in the last year. They deny (admit to) any hearing difficulty and have not had audiologic testing in the last year.  They do not  have excessive sun exposure. Discussed the need for sun protection: hats, long sleeves and use of sunscreen if there is significant sun exposure.   Diet: the importance of a healthy diet is discussed. They do have a healthy (unhealthy-high fat/fast food) diet.  The patient has a regular exercise program.  The benefits of regular aerobic exercise were discussed.  Depression screen: there are no signs or vegative symptoms of depression- irritability, change in appetite, anhedonia, sadness/tearfullness.  Cognitive assessment: the patient manages all their financial and personal affairs and is actively engaged. They could relate day,date,year and events; recalled 3/3 objects at 3 minutes; performed clock-face test normally.  The following portions of the  patient's history were reviewed and updated as appropriate: allergies, current medications, past family history, past medical history,  past surgical history, past social history  and problem list.  Vision, hearing, body mass index were assessed and reviewed.   During the course of the visit the patient was educated and counseled about appropriate screening and preventive services including : fall prevention , diabetes screening, nutrition counseling, colorectal cancer screening, and recommended immunizations.  

## 2016-09-03 NOTE — Progress Notes (Signed)
Pre visit review using our clinic review tool, if applicable. No additional management support is needed unless otherwise documented below in the visit note. 

## 2016-09-03 NOTE — Telephone Encounter (Signed)
PA initiated via CoverMyMeds key [id number]

## 2016-09-04 LAB — HEPATITIS C ANTIBODY: HCV Ab: NEGATIVE

## 2016-09-05 ENCOUNTER — Encounter: Payer: Self-pay | Admitting: Internal Medicine

## 2016-09-10 ENCOUNTER — Telehealth: Payer: Self-pay | Admitting: Internal Medicine

## 2016-09-10 ENCOUNTER — Other Ambulatory Visit: Payer: Self-pay | Admitting: Internal Medicine

## 2016-09-10 DIAGNOSIS — E785 Hyperlipidemia, unspecified: Secondary | ICD-10-CM

## 2016-09-10 DIAGNOSIS — I2510 Atherosclerotic heart disease of native coronary artery without angina pectoris: Secondary | ICD-10-CM

## 2016-09-10 MED ORDER — ROSUVASTATIN CALCIUM 5 MG PO TABS: 5.0000 mg | ORAL_TABLET | Freq: Every day | ORAL | 3 refills | Status: DC

## 2016-09-10 NOTE — Telephone Encounter (Signed)
Patient state she has some confusion with medication.  Has been taking crestor.  States that Dr. Yetta BarreJones wanted him to start a different medication.

## 2016-09-10 NOTE — Telephone Encounter (Signed)
Pt question was your advise on the effectiveness of taking livalo 7 days per week vs crestor 3 days per. Pt stated that he did not understand why he would take more medication if his labs are within range right now.  I explained to pt that the crestor was changed due to the side effects that he described at his last visit.

## 2016-09-11 NOTE — Telephone Encounter (Signed)
LVM for pt to call back as soon as possible.   

## 2016-09-12 NOTE — Telephone Encounter (Signed)
Spoke to pt and informed that PCP changed statin therapy to take rx 7 days vs every other day was to keep the same amount of statin in the body to reduce the risk of heart attack or stroke. I informed pt that it would be his decision to follow PCP or cards MD rx sig advise. Pt stated that he was still very confused on what to do and didn't understand how 2 MD's had different ideas about how to take the statin.

## 2016-09-12 NOTE — Telephone Encounter (Signed)
Patient called back. Insisted on speaking with stef. He doesn't have a best time to call. Please call him back

## 2016-09-25 ENCOUNTER — Telehealth: Payer: Self-pay | Admitting: Internal Medicine

## 2016-09-25 ENCOUNTER — Telehealth: Payer: Self-pay | Admitting: Cardiovascular Disease

## 2016-09-25 DIAGNOSIS — E785 Hyperlipidemia, unspecified: Secondary | ICD-10-CM

## 2016-09-25 NOTE — Telephone Encounter (Signed)
Patient has been taking his Crestor on Monday-Wed-Fri and seems to be working well for him.  When he was taking one/day, it made him very weak and tired.  His primary care provider wants him to go back to one/day but he really does not want to.  He would like to confirm with RN here that is it OK to take it M-W-F

## 2016-09-25 NOTE — Telephone Encounter (Signed)
Patient Name: Trevor RichesFREDERICK [name] DOB: 08/19/1946 Initial Comment Caller states he has been having reaction to famotidine, choking/gagging, trouble breathing at night. Feels great now, but happens when he takes meds at night. Nurse Assessment Nurse: Laural BenesJohnson, RN, Dondra SpryGail Date/Time Trevor [name](Eastern Time): 09/25/2016 10:28:44 AM Confirm and document reason for call. If symptomatic, describe symptoms. You must click the next button to save text entered. ---Has reflux issue -- famotidine 40mg  (pepcid) Sunday started having choking episode and difficulty breathing Does the patient have any new or worsening symptoms? ---Yes Will a triage be completed? ---Yes Related visit to physician within the last 2 weeks? ---No Does the PT have any chronic conditions? (i.e. diabetes, asthma, etc.) ---No Is this a behavioral health or substance abuse call? ---No Guidelines Guideline Title Affirmed Question Affirmed Notes Chest Pain [1] Patient claims chest pain is same as previously diagnosed "heartburn" AND [2] describes burning in chest AND [3] accompanying sour taste in mouth Final Disposition User Call PCP within 24 Hours Laural BenesJohnson, RN, Dondra SpryGail Comments Pt states he will have to check his scheduled and will call the office for an appt. Referrals GO TO FACILITY UNDECIDED Disagree/Comply: Comply

## 2016-09-27 ENCOUNTER — Encounter: Payer: Self-pay | Admitting: Internal Medicine

## 2016-09-27 NOTE — Telephone Encounter (Signed)
I think that is ok for now. cdm

## 2016-09-27 NOTE — Telephone Encounter (Signed)
I spoke with Trevor [name]. Based on lipid results done in our office on 04/19/16 Trevor [name] was to try to increase Crestor to 10 mg 4 days per week.  He reports he was unable to increase due to fatigue.  Lipids done on 09/03/16 at primary care (results in EPIC). Trevor [name] was taking Crestor 10 mg 3 days per week at this time.  Based on these results and Trevor [name]'s fatigue a change to Livalo was discussed at primary care.  Trevor [name] decided to stay on Crestor instead of changing to Livalo.  Primary care recommended he take Crestor 5 mg daily based on 11/6 results.  Trevor [name] has been taking this and is able to tolerate.  Trevor [name] is asking if this dose is OK with Dr. Clifton JamesMcAlhany based on lab results.  Trevor [name] is seeing Dr. Clifton JamesMcAlhany on 11/23/16 and will recheck lipid and liver profiles that day.

## 2016-09-27 NOTE — Telephone Encounter (Signed)
Pt notified of information from Dr. Clifton JamesMcAlhany

## 2016-11-23 ENCOUNTER — Ambulatory Visit (INDEPENDENT_AMBULATORY_CARE_PROVIDER_SITE_OTHER): Payer: Medicare Other | Admitting: Cardiovascular Disease

## 2016-11-23 ENCOUNTER — Other Ambulatory Visit: Payer: Medicare Other | Admitting: *Deleted

## 2016-11-23 ENCOUNTER — Encounter: Payer: Self-pay | Admitting: Cardiovascular Disease

## 2016-11-23 VITALS — BP 130/70 | HR 54 | Ht 67.0 in | Wt 163.0 lb

## 2016-11-23 DIAGNOSIS — I2510 Atherosclerotic heart disease of native coronary artery without angina pectoris: Secondary | ICD-10-CM

## 2016-11-23 DIAGNOSIS — E785 Hyperlipidemia, unspecified: Secondary | ICD-10-CM

## 2016-11-23 NOTE — Progress Notes (Signed)
Chief Complaint  Patient presents with  . Coronary Artery Disease    follow up     History of Present Illness: 71 yo male with history of HLD, CAD, pericarditis here today for follow up. He was [facility] with chest pain and acute ST changes. Emergent cardiac cath with mild disease mid RCA but no flow limiting lesions. Echo 10/31/15 with normal LV systolic function, moderate LVH, trivial AI. He was treated with an NSAID for possible acute pericarditis.  He is here today for follow up. No chest pain or SOB. No palpitations.   Primary Care Physician: Sanda Lingerhomas Jones, MD   Past Medical History:  Diagnosis Date  . Acute pericarditis    a. admitted as a code STEMI but no sig CAD on cath.   . CAD (coronary artery disease)    30% stenosis mid RCA  . Colonic polyp    Dr. Kinnie ScalesMedoff; hyperplastic polyp  . Gilbert syndrome   . Hyperlipidemia     Past Surgical History:  Procedure Laterality Date  . actinic keratoses     facial removed x 2, Dr Danella DeisGruber  . basal cell cancer  2013 & 2015   Dr Campbell StallHope Gruber  . CARDIAC CATHETERIZATION N/A 10/30/2015   Procedure: Left Heart Cath and Coronary Angiography;  Surgeon: Kathleene Hazelhristopher D Maryanna Stuber, MD;  Location[facility] INVASIVE CV LAB;  Service: Cardiovascular;  Laterality: N/A;  . COLONOSCOPY  2014   negative  . COLONOSCOPY W/ POLYPECTOMY  2004   @ age 71, Dr Kinnie ScalesMedoff. ? Hyperplastic polyp  . FLEXIBLE SIGMOIDOSCOPY  1999   negative  . KNEE ARTHROSCOPY  1984   right    Current Outpatient Prescriptions  Medication Sig Dispense Refill  . Ascorbic Acid (VITAMIN C PO) Take 2,000 mg by mouth daily.     Marland Kitchen. aspirin 81 MG tablet Take 81 mg by mouth daily.      . Cholecalciferol (VITAMIN D) 2000 UNITS CAPS Take 2,000 Units by mouth daily.     . famotidine (PEPCID) 40 MG tablet Take 1 tablet (40 mg total) by mouth at bedtime. 90 tablet 3  . fish oil-omega-3 fatty acids 1000 MG capsule Take 1 g by mouth daily.     . Magnesium 250 MG TABS Take 250 mg by  mouth daily.     . metroNIDAZOLE (METROCREAM) 0.75 % cream Apply 1 application topically daily.    . rosuvastatin (CRESTOR) 5 MG tablet Take 1 tablet (5 mg total) by mouth daily. 90 tablet 3  . VENTOLIN HFA 108 (90 Base) MCG/ACT inhaler INHALE 1 TO 2 PUFFS INTO THE LUNGS EVERY 15 TO 30 MINUTES PRIOR TO EXERCISE 18 g 0   No current facility-administered medications for this visit.     Allergies  Allergen Reactions  . Codeine Anaphylaxis    Mental status changes  . Crestor [Rosuvastatin Calcium] Other (See Comments)    Severe fatigue  . Niacin Other (See Comments)    flushing    Social History   Social History  . Marital status: Married    Spouse name: N/A  . Number of children: N/A  . Years of education: N/A   Occupational History  . Not on file.   Social History Main Topics  . Smoking status: Former Smoker    Packs/day: 1.00    Years: 13.00    Types: Cigarettes    Quit date: 10/29/1978  . Smokeless tobacco: Never Used     Comment: smoked 1967-1980, up to 1  ppd  . Alcohol use No  . Drug use: No  . Sexual activity: Not on file   Other Topics Concern  . Not on file   Social History Narrative   Originally from [place]. Moved to [place] in 1951. He moved to [place] in 1984. 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 Previously has traveled to [place], [place], [place], [place], [place], and [place]. Currently has a cockatiel. No mold exposure. Has a hot tub but doesn't use it regularly.     Family History  Problem Relation Age of Onset  . Stroke Father 39  . Alcohol abuse Father   . Breast cancer Mother   . HIV Brother   . Prostate cancer Brother 26  . Breast cancer Maternal Aunt   . Breast cancer Maternal Grandmother   . Heart disease Neg Hx   . Diabetes Neg Hx   . Asthma Brother     Review of Systems:  As stated in the HPI and otherwise negative.   BP 130/70   Pulse (!) 54   Ht 5\' 7"  (1.702 m)   Wt 163 lb  (73.9 kg)   BMI 25.53 kg/m   Physical Examination: General: Well developed, well nourished, NAD  HEENT: OP clear, mucus membranes moist  SKIN: warm, dry. No rashes. Neuro: No focal deficits  Musculoskeletal: Muscle strength 5/5 all ext  Psychiatric: Mood and affect normal  Neck: No JVD, no carotid bruits, no thyromegaly, no lymphadenopathy.  Lungs:Clear bilaterally, no wheezes, rhonci, crackles Cardiovascular: Regular rate and rhythm. No murmurs, gallops or rubs. Abdomen:Soft. Bowel sounds present. Non-tender.  Extremities: No lower extremity edema. Pulses are 2 + in the bilateral DP/PT.  Cardiac cath 10/30/15: Coronary Findings    Dominance: Right   Left Anterior Descending  . Vessel is large. Vessel is angiographically normal.   . First Diagonal Branch   The vessel is moderate in size and is angiographically normal.   . Second Diagonal Branch   The vessel is small in size.     Left Circumflex  . Vessel is moderate in size. Vessel is angiographically normal.   . First Obtuse Marginal Branch   The vessel is moderate in size and is angiographically normal.     Right Coronary Artery  . Vessel is large.   . Mid RCA lesion, 30% stenosed. Discrete.   . Right Posterior Descending Artery   The vessel is moderate in size.      Wall Motion                 Coronary Diagrams    Diagnostic Diagram         Echo 10/31/15: Left ventricle: The cavity size was normal. Wall thickness was increased in a pattern of moderate LVH. Systolic function was normal. The estimated ejection fraction was in the range of 60% to 65%. GLPSS -19% - normal. Wall motion was normal; there were no regional wall motion abnormalities. Doppler parameters are consistent with abnormal left ventricular relaxation (grade 1 diastolic dysfunction). The E/e&' ratio is <8, suggesting normal LV filling pressure. - Aortic valve: Trileaflet. Sclerosis without stenosis. There was trivial  regurgitation. - Mitral valve: Mildly thickened leaflets . - Left atrium: The atrium was normal in size. - Right atrium: The atrium was mildly dilated. - Inferior vena cava: The vessel was normal in size. The respirophasic diameter changes were in the normal range (>= 50%), consistent with normal central venous pressure.  Impressions:  -  LVEF 60-65%, moderate LVH, normal wall motion, normal GLPSS, diastolic dysfunction by TDI with normal LV filling pressure, trivial AI, mild RAE.  EKG:  EKG is ordered today. The ekg ordered today demonstrates sinus brady, rate 54 bpm  Recent Labs: 09/03/2016: ALT 21; BUN 20; Creatinine, Ser 1.02; Hemoglobin 13.6; Platelets 233.0; Potassium 5.2; Sodium 139; TSH 2.72   Lipid Panel    Component Value Date/Time   CHOL 172 09/03/2016 0927   TRIG 81.0 09/03/2016 0927   HDL 56.30 09/03/2016 0927   CHOLHDL 3 09/03/2016 0927   VLDL 16.2 09/03/2016 0927   LDLCALC 99 09/03/2016 0927   LDLDIRECT 159.2 08/31/2013 0946     Wt Readings from Last 3 Encounters:  11/23/16 163 lb (73.9 kg)  09/03/16 158 lb (71.7 kg)  06/01/16 158 lb (71.7 kg)     Other studies Reviewed: Additional studies/ records that were reviewed today include: . Review of the above records demonstrates:   Assessment and Plan:   1. CAD without angina: He has mild disease in the mid RCA. Continue ASA and statin.   2. Pericarditis: Resolved. No recurrent chest pain  3. Hyperlipidemia: Continue daily statin. He is taking Crestor 5 mg daily.   Current medicines are reviewed at length with the patient today.  The patient does not have concerns regarding medicines.  The following changes have been made:  no change  Labs/ tests ordered today include:   Orders Placed This Encounter  Procedures  . EKG 12-Lead    Disposition:   FU with me  in 12  months  Signed, Verne Carrow, MD 11/23/2016 10:09 AM    [facility] [address] [place],  [place], [place]  [address] Phone: [phone]; Fax: [phone]

## 2016-11-23 NOTE — Patient Instructions (Signed)

## 2016-11-23 NOTE — Addendum Note (Signed)
Addended by: Tonita PhoenixBOWDEN, Bern Fare K on: 11/23/2016 07:55 AM   Modules accepted: Orders

## 2016-11-24 LAB — LIPID PANEL
CHOL/HDL RATIO: 2.8 ratio (ref 0.0–5.0)
Cholesterol, Total: 149 mg/dL (ref 100–199)
HDL: 54 mg/dL (ref >39)
LDL CALC: 85 mg/dL (ref 0–99)
TRIGLYCERIDES: 52 mg/dL (ref 0–149)
VLDL CHOLESTEROL CAL: 10 mg/dL (ref 5–40)

## 2016-11-24 LAB — HEPATIC FUNCTION PANEL
ALBUMIN: 4.2 g/dL (ref 3.6–4.8)
ALK PHOS: 50 IU/L (ref 39–117)
ALT: 23 IU/L (ref 0–44)
AST: 33 IU/L (ref 0–40)
Bilirubin Total: 1.5 mg/dL — ABNORMAL HIGH (ref 0.0–1.2)
Bilirubin, Direct: 0.36 mg/dL (ref 0.00–0.40)
TOTAL PROTEIN: 6.3 g/dL (ref 6.0–8.5)

## 2016-12-03 ENCOUNTER — Encounter: Payer: Self-pay | Admitting: Cardiovascular Disease

## 2016-12-28 ENCOUNTER — Encounter: Payer: Self-pay | Admitting: Internal Medicine

## 2017-01-18 ENCOUNTER — Encounter: Payer: Self-pay | Admitting: Internal Medicine

## 2017-04-08 ENCOUNTER — Encounter: Payer: Self-pay | Admitting: Sports Medicine

## 2017-04-08 ENCOUNTER — Ambulatory Visit (INDEPENDENT_AMBULATORY_CARE_PROVIDER_SITE_OTHER): Payer: Medicare Other | Admitting: Sports Medicine

## 2017-04-08 ENCOUNTER — Ambulatory Visit
Admission: RE | Admit: 2017-04-08 | Discharge: 2017-04-08 | Disposition: A | Discharge status: Home / Self Care | Payer: Medicare Other | Source: Ambulatory Visit | Attending: Sports Medicine | Admitting: Sports Medicine

## 2017-04-08 VITALS — BP 128/64 | Ht 66.0 in | Wt 156.0 lb

## 2017-04-08 DIAGNOSIS — G8929 Other chronic pain: Secondary | ICD-10-CM

## 2017-04-08 DIAGNOSIS — M545 Low back pain: Secondary | ICD-10-CM

## 2017-04-08 MED ORDER — KETOROLAC TROMETHAMINE 60 MG/2ML IM SOLN
60.0000 mg | Freq: Once | INTRAMUSCULAR | Status: AC
  Administered 2017-04-08: 60 mg via INTRAMUSCULAR

## 2017-04-08 MED ORDER — METHYLPREDNISOLONE ACETATE 80 MG/ML IJ SUSP
80.0000 mg | Freq: Once | INTRAMUSCULAR | Status: AC
  Administered 2017-04-08: 80 mg via INTRAMUSCULAR

## 2017-04-08 MED ORDER — PREDNISONE 10 MG PO TABS: ORAL_TABLET | ORAL | 0 refills | Status: DC

## 2017-04-08 NOTE — Patient Instructions (Addendum)
Plan for back pain:  1. You will be receiving two injections (cortisone and toradol) today to help alleviate pain. 2. We will order back X-rays for you. 3. Use moist heat pad on your back at home. 4. Rest for couple days and then slowly increase running activity. 5. You will be provided a tapered steroid dose prescription that you may fill in couple days if the injections do not relieve your pain. 6. Follow up back with us in one week.

## 2017-04-08 NOTE — Progress Notes (Signed)
Subjective:    Trevor [name] - 71 y.o. male MRN [id number]  Date of birth: [birth year]  CC: Back pain  HPI: Trevor [name] is a 71 y/o male presenting to the clinic with low back pain. He states that pain started 3 weeks ago while he was running. Pain has worsened since then. It is sharp and starts in left lower back (near SI joint) and on occasion radiates down left thigh but not past his knee. Pain is worse during activity but he endorses relief while sitting down. He saw a chiropractor and has tried ice and acetaminophen with minimal relief. He also endorses left knee pain with rest which improves with activity. Denies swelling, popping, or locking of knees. Denies trauma, numbness, or tingling. No loss of bladder or bowl fxn. No pain associated with sneezing or coughing. He is an avid runner and runs three days a week for 20-25 miles total. He is currently training for a marathon in October.  ROS: No fever, chills, or weight loss. Negative except per HPI.  PMH: CAD, Hyperlipidemia, Gilbert syndrome    Objective:   Physical Exam BP 128/64   Ht 5\' 6"  (1.676 m)   Wt 156 lb (70.8 kg)   BMI 25.18 kg/m  Gen: NAD, alert, cooperative with exam, well-appearing Psych: good insight, alert and oriented Back Exam:  Inspection: Unremarkable  No TTP over midline or over paraspinous muscles Range of Motion:  Flexion limited to 40 deg 2/2 to pain and extension limited to 15 deg 2/2 to pain Leg strength: Quad: 5/5 Hamstring: 5/5 Hip flexor: 5/5 Hip abductors: 5/5  Strength at foot: Plantar-flexion: 5/5 Dorsi-flexion: 5/5 Eversion: 5/5 Inversion: 5/5  Sensory change: Grossly intact Reflexes: 2+ at both patellar tendons, 2+ at achilles tendons SLR laying: negative  FABER: negative.  L. Knee: No erythema, swelling, or obvious bony deformities noted; no warmth or joint line tenderness FROM upon flexion and extension; ACL, PCL, LCL, and MCL intact Hamstring and quadriceps strength is  normal. NV intact distally    Assessment & Plan:  Trevor [name] is a 71 y/o male presenting to the clinic with low back pain.  Question Facet Arthropathy His symptoms and exam are most likely due to facet arthropathy secondary to degenerative changes. No radiation of pain, numbness, tingling along with a negative SLR on exam makes sciatic pain unlikely. Discogenic is unlikely as he obtains symptomatic relief while sitting down. His knee pain could be due to compensatory running induced by his back pain, so I advised him to wear a compression sleeve while running. Given the chronicity of his pain along with his ongoing marathon training he will benefit from am IM cortisone and Toradol injection for acute relief. I will also obtain L spine imaging to rule out other underlying pathology. Will provide him with a tapered prednisone dose pack to initiate in two days if no improvement from injections. Advised him to use moist heating pad on his back and I will re-assess him in one week. -L Spine X-rays -Toradol and prednisone injections today (IM) -Start tapered prednisone dose pack in two days if necessary -Use of moist heating pad for back -Use of knee compression sleeve while running -F/u in one week  I personally was present and performed or re-performed the history, physical exam and medical decision-making activities of this service and have verified that the service and findings are accurately documented in the student's note. We will get some x-rays of his lumbar spine. I  think his symptoms are originating from his facets. SI joint testing today was unremarkable. Given his level of discomfort he was injected with 80 mg of Depo-Medrol IM and 60 mg of Toradol IM. He will start a 6 day Sterapred Dosepak in 48 hours if he is still having pain. Follow-up with me in one week for reevaluation and we will discuss his x-ray findings at that time. If symptoms persist, then we may need to consider merits  of further diagnostic imaging pending his x-ray results.

## 2017-04-09 ENCOUNTER — Ambulatory Visit: Payer: Medicare Other | Admitting: Sports Medicine

## 2017-04-10 ENCOUNTER — Telehealth: Payer: Self-pay | Admitting: Sports Medicine

## 2017-04-10 NOTE — Telephone Encounter (Signed)
Left a message on the patient's voicemail after reviewing x-rays of his lumbar spine. He does have some mild facet arthropathy. Mild degenerative disc disease as well. Follow-up with me as scheduled next week.

## 2017-04-15 ENCOUNTER — Ambulatory Visit (INDEPENDENT_AMBULATORY_CARE_PROVIDER_SITE_OTHER): Payer: Medicare Other | Admitting: Sports Medicine

## 2017-04-15 ENCOUNTER — Encounter: Payer: Self-pay | Admitting: Sports Medicine

## 2017-04-15 VITALS — BP 140/62 | Ht 67.0 in | Wt 163.0 lb

## 2017-04-15 DIAGNOSIS — M545 Low back pain, unspecified: Secondary | ICD-10-CM

## 2017-04-15 NOTE — Progress Notes (Signed)
   Subjective:    Patient ID: Trevor [name], male    DOB: 1946/08/23, 71 y.o.   MRN: [id number]  HPI   Patient comes in today for follow-up on low back pain. Overall, his pain has improved dramatically. Last week, the patient called with persistent pain and I prescribed him a 6 day Sterapred Dosepak. However, he did not feel the need to take it. He instead took Tylenol which has worked tremendously. He has not returned to any running. He would like to train for a marathon. He has run several half marathons in the past but not a full marathon.    Review of Systems As above    Objective:   Physical Exam  Well-developed, well-nourished. No acute distress.  Lumbar spine: Full range of motion. No spasm. He is slightly tender to palpation just to the left of L5-S1 midline and some tenderness to palpation at the left SI joint as well but he has a negative FABER. Grossly neurovascularly intact distally  X-rays of his lumbar spine including AP and lateral views show minimal degenerative changes. He has mild degenerative disc disease and mild facet hypertrophy (most noticeable at L5-S1). Nothing acute is seen      Assessment & Plan:   Improved low back pain likely secondary to mild facet hypertrophy  Patient will slowly resume activity as tolerated including running. He will start out running on softer surfaces such as trail or a soft track. I'm not sure if his body will tolerate a marathon and I spoke with him about possibly sticking to half marathons. He should also avoid adding on additional  impact activities such as high impact Boot Camp's. He still has his prednisone to take if his symptoms once again worsen. We also discussed the possibility of further diagnostic imaging in the anticipation of referral for facet injections if his pain becomes severe. He will follow-up with me for ongoing or recalcitrant issues.  Total time spent with the patient was 15 minutes with greater than 50%  of the time spent in face-to-face consultation discussing x-ray findings, diagnosis, and resumption of activity including running.

## 2017-04-15 NOTE — Patient Instructions (Signed)
I'm glad you're feeling better! Your okay to go ahead and slowly resume your normal workouts However, avoid a lot of high impact "[employer] "types of workouts Use your Tylenol as needed When you resume running, start on soft surfaces like a soft track or trails/grass Overall, your x-rays look really good. Some mild arthritis but not bad  If things get really bad again then you could take your prednisone, but I would want you to call me first if that's the case  We could also consider an MRI at some point if needed but that would only be if we were going to consider some sort of spinal injection  I'm happy to see you again as needed

## 2017-06-13 ENCOUNTER — Ambulatory Visit (INDEPENDENT_AMBULATORY_CARE_PROVIDER_SITE_OTHER): Payer: Medicare Other | Admitting: Adult Health

## 2017-06-13 ENCOUNTER — Telehealth: Payer: Self-pay | Admitting: Internal Medicine

## 2017-06-13 ENCOUNTER — Encounter: Payer: Self-pay | Admitting: Adult Health

## 2017-06-13 VITALS — BP 134/72 | Temp 98.4°F | Ht 66.75 in | Wt 159.0 lb

## 2017-06-13 DIAGNOSIS — I2510 Atherosclerotic heart disease of native coronary artery without angina pectoris: Secondary | ICD-10-CM | POA: Diagnosis not present

## 2017-06-13 DIAGNOSIS — Z7689 Persons encountering health services in other specified circumstances: Secondary | ICD-10-CM | POA: Diagnosis not present

## 2017-06-13 DIAGNOSIS — Z23 Encounter for immunization: Secondary | ICD-10-CM | POA: Diagnosis not present

## 2017-06-13 NOTE — Telephone Encounter (Signed)
Error/mm °

## 2017-06-13 NOTE — Patient Instructions (Signed)
It was great meeting you today   Please follow up with me in November for your physical   If you need anything in the meantime, please do not hesitate to let me know

## 2017-06-13 NOTE — Telephone Encounter (Signed)
Pt [facility].

## 2017-06-13 NOTE — Progress Notes (Signed)
Patient presents to clinic today to establish care. He is a pleasant 71 year old male who  has a past medical history of Acute pericarditis; CAD (coronary artery disease); Colonic polyp; [name]; and Hyperlipidemia.  He is a previous patient of Dr. Alwyn Ren and then Dr. Yetta Barre.   His last physical was in November 2017   Acute Concerns: Establish Care   Chronic Issues: CAD - takes fish oil and asa. He has been prescribed statin in the past but stopped taking it due to fatigue and muscle cramps   GERD - Was taking Prilosec but reports allergy to this medication. He felt as though it made it hard to breath   Health Maintenance: Dental -- Routine Care  Vision -- Routine Care  Immunizations -- Needs Prevnar 23  Colonoscopy -- 2014  Diet: Eats a heart healthy diet Exercise: Runs 30 miles a day   He is followed by:   Cardiology - Dr. Clifton James  GI [facility]  Dermatology -   Past Medical History:  Diagnosis Date  . Acute pericarditis    a. admitted as a code STEMI but no sig CAD on cath.   . CAD (coronary artery disease)    30% stenosis mid RCA  . Colonic polyp    Dr. Kinnie Scales; hyperplastic polyp  . Gilbert syndrome   . Hyperlipidemia     Past Surgical History:  Procedure Laterality Date  . actinic keratoses     facial removed x 2, Dr Danella Deis  . basal cell cancer  2013 & 2015   Dr Campbell Stall  . CARDIAC CATHETERIZATION N/A 10/30/2015   Procedure: Left Heart Cath and Coronary Angiography;  Surgeon: Kathleene Hazel, MD;  Location: [facility] INVASIVE CV LAB;  Service: Cardiovascular;  Laterality: N/A;  . COLONOSCOPY  2014   negative  . COLONOSCOPY W/ POLYPECTOMY  2004   @ age 69, Dr Kinnie Scales. ? Hyperplastic polyp  . FLEXIBLE SIGMOIDOSCOPY  1999   negative  . KNEE ARTHROSCOPY  1984   right    Current Outpatient Prescriptions on File Prior to Visit  Medication Sig Dispense Refill  . Ascorbic Acid (VITAMIN C PO) Take 2,000 mg by mouth daily.     Marland Kitchen aspirin 81 MG  tablet Take 81 mg by mouth daily.      . Cholecalciferol (VITAMIN D) 2000 UNITS CAPS Take 2,000 Units by mouth daily.     . fish oil-omega-3 fatty acids 1000 MG capsule Take 1 g by mouth daily.     . Magnesium 250 MG TABS Take 250 mg by mouth daily.     . metroNIDAZOLE (METROCREAM) 0.75 % cream Apply 1 application topically daily.    . VENTOLIN HFA 108 (90 Base) MCG/ACT inhaler INHALE 1 TO 2 PUFFS INTO THE LUNGS EVERY 15 TO 30 MINUTES PRIOR TO EXERCISE 18 g 0  . famotidine (PEPCID) 40 MG tablet Take 1 tablet (40 mg total) by mouth at bedtime. (Patient not taking: Reported on 06/13/2017) 90 tablet 3  . rosuvastatin (CRESTOR) 5 MG tablet Take 1 tablet (5 mg total) by mouth daily. (Patient not taking: Reported on 06/13/2017) 90 tablet 3   No current facility-administered medications on file prior to visit.     Allergies  Allergen Reactions  . Codeine Anaphylaxis    Mental status changes  . Crestor [Rosuvastatin Calcium] Other (See Comments)    Severe fatigue  . Niacin Other (See Comments)    flushing    Family History  Problem Relation  Age of Onset  . Stroke Father 20  . Alcohol abuse Father   . Asthma Brother   . Breast cancer Mother   . HIV Brother   . Prostate cancer Brother 68  . Breast cancer Maternal Aunt   . Breast cancer Maternal Grandmother   . Heart disease Neg Hx   . Diabetes Neg Hx     Social History   Social History  . Marital status: Married    Spouse name: N/A  . Number of children: N/A  . Years of education: N/A   Occupational History  . Not on file.   Social History Main Topics  . Smoking status: Former Smoker    Packs/day: 1.00    Years: 13.00    Types: Cigarettes    Quit date: 10/29/1978  . Smokeless tobacco: Never Used     Comment: smoked 1967-1980, up to 1 ppd  . Alcohol use No  . Drug use: No  . Sexual activity: Not on file   Other Topics Concern  . Not on file   Social History Narrative   Originally from [place]. Moved to [place] in 1951. He moved  to [place] in 1984. 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 Previously has traveled to [place], [place], [place], [place], [place], and [place]. Currently has a cockatiel. No mold exposure. Has a hot tub but doesn't use it regularly.     Review of Systems  Constitutional: Negative.   Eyes: Negative.   Respiratory: Negative.   Cardiovascular: Negative.   Gastrointestinal: Negative.   Genitourinary: Negative.   Musculoskeletal: Negative.   Neurological: Negative.   Psychiatric/Behavioral: Negative.   All other systems reviewed and are negative.   BP 134/72 (BP Location: Left Arm)   Temp 98.4 F (36.9 C) (Oral)   Ht 5' 6.75" (1.695 m)   Wt 159 lb (72.1 kg)   BMI 25.09 kg/m   Physical Exam  Constitutional: He is oriented to person, place, and time and well-developed, well-nourished, and in no distress. No distress.  HENT:  Head: Normocephalic and atraumatic.  Right Ear: External ear normal.  Left Ear: External ear normal.  Nose: Nose normal.  Mouth/Throat: Oropharynx is clear and moist. No oropharyngeal exudate.  Eyes: Pupils are equal, round, and reactive to light. Conjunctivae and EOM are normal. Right eye exhibits no discharge. Left eye exhibits no discharge. No scleral icterus.  Cardiovascular: Normal rate, regular rhythm, normal heart sounds and intact distal pulses.  Exam reveals no gallop and no friction rub.   No murmur heard. Pulmonary/Chest: Effort normal and breath sounds normal. No respiratory distress. He has no wheezes. He has no rales. He exhibits no tenderness.  Musculoskeletal: Normal range of motion. He exhibits no edema, tenderness or deformity.  Neurological: He is alert and oriented to person, place, and time. He displays normal reflexes. No cranial nerve deficit. He exhibits normal muscle tone. Gait normal. Coordination normal. GCS score is 15.  Skin: Skin is warm and dry. No rash noted. He is not  diaphoretic. No erythema. No pallor.  Psychiatric: Mood, memory, affect and judgment normal.  Nursing note and vitals reviewed.   Assessment/Plan:  1. Encounter to establish care - Follow up in November for CPE or sooner if needed for any acute issues  2. Coronary artery disease involving native coronary artery of native heart without angina pectoris - He does not want to take statin at this time.  - Advised to continue to eat  a heart healthy diet and exercise  - Will recheck lipid in November  3. Need for 23-polyvalent pneumococcal polysaccharide vaccine  - Pneumococcal polysaccharide vaccine 23-valent greater than or equal to 2yo subcutaneous/IM  Shirline Freesory Arish Redner, NP

## 2017-06-17 ENCOUNTER — Ambulatory Visit (INDEPENDENT_AMBULATORY_CARE_PROVIDER_SITE_OTHER): Payer: Medicare Other | Admitting: Family Medicine

## 2017-06-17 ENCOUNTER — Encounter: Payer: Self-pay | Admitting: Family Medicine

## 2017-06-17 DIAGNOSIS — M25561 Pain in right knee: Secondary | ICD-10-CM

## 2017-06-17 DIAGNOSIS — M25562 Pain in left knee: Secondary | ICD-10-CM | POA: Diagnosis not present

## 2017-06-17 NOTE — Patient Instructions (Addendum)
Your knee pain is due to patellofemoral syndrome, less likely arthritis. Avoid painful activities when possible (often deep squats, lunges bother this). Cross train with swimming, cycling with low resistance, elliptical if needed. Consider dropping to 50% of what your running program states and increase by 10% per week while you do the rehab. Straight leg raise, hip side raises, straight leg raises with foot turned outwards 3 sets of 10 once a day. Add ankle weight if these become too easy. Consider formal physical therapy. Consider inserts like dr. Jari Sportsman active series, spencos though in your case I don't think these would make a big difference. Avoid flat shoes, barefoot walking as much as possible. Icing 15 minutes at a time 3-4 times a day as needed. Tylenol or ibuprofen as needed for pain (tylenol as first line). Capsaicin, aspercreme, or biofreeze topically up to four times a day may also help with pain. Some supplements that may help for arthritis: Boswellia extract, curcumin, pycnogenol Cortisone injections are an option but would only help if this is arthritis flaring up. Follow up with me in 5-6 weeks.

## 2017-06-18 DIAGNOSIS — M25561 Pain in right knee: Secondary | ICD-10-CM | POA: Insufficient documentation

## 2017-06-18 DIAGNOSIS — M25562 Pain in left knee: Secondary | ICD-10-CM

## 2017-06-18 NOTE — Progress Notes (Signed)
PCP: Patient, No Pcp Per  Subjective:   HPI: Patient is a 71 y.o. male here for bilateral knee pain.  Patient reports he's training for the [employer]	[place] marathon on October 28th. Over past 1-2 weeks he's developed pain in both knees anteriorly with his long runs. Initially was just in the right knee but progressed to include the left. Bothered last Saturday about 10-11 miles into his run. Some benefit with epsom salt soaks, tylenol, Pain is 0/10 now but up to 8/10 and sharp when long distances. No acute injury or trauma. No skin changes, numbness.  Past Medical History:  Diagnosis Date  . Acute pericarditis    a. admitted as a code STEMI but no sig CAD on cath.   . CAD (coronary artery disease)    30% stenosis mid RCA  . Colonic polyp    Dr. Kinnie Scales; hyperplastic polyp  . Gilbert syndrome   . Hyperlipidemia     Current Outpatient Prescriptions on File Prior to Visit  Medication Sig Dispense Refill  . Ascorbic Acid (VITAMIN C PO) Take 2,000 mg by mouth daily.     Marland Kitchen aspirin 81 MG tablet Take 81 mg by mouth daily.      . Cholecalciferol (VITAMIN D) 2000 UNITS CAPS Take 2,000 Units by mouth daily.     . fish oil-omega-3 fatty acids 1000 MG capsule Take 1 g by mouth daily.     . Magnesium 250 MG TABS Take 250 mg by mouth daily.     . metroNIDAZOLE (METROCREAM) 0.75 % cream Apply 1 application topically daily.    . rosuvastatin (CRESTOR) 5 MG tablet Take 1 tablet (5 mg total) by mouth daily. (Patient not taking: Reported on 06/13/2017) 90 tablet 3  . VENTOLIN HFA 108 (90 Base) MCG/ACT inhaler INHALE 1 TO 2 PUFFS INTO THE LUNGS EVERY 15 TO 30 MINUTES PRIOR TO EXERCISE 18 g 0   No current facility-administered medications on file prior to visit.     Past Surgical History:  Procedure Laterality Date  . actinic keratoses     facial removed x 2, Dr Danella Deis  . basal cell cancer  2013 & 2015   Dr Campbell Stall  . CARDIAC CATHETERIZATION N/A 10/30/2015   Procedure: Left Heart Cath and  Coronary Angiography;  Surgeon: Kathleene Hazel, MD;  Location: [facility] INVASIVE CV LAB;  Service: Cardiovascular;  Laterality: N/A;  . COLONOSCOPY  2014   negative  . COLONOSCOPY W/ POLYPECTOMY  2004   @ age 37, Dr Kinnie Scales. ? Hyperplastic polyp  . FLEXIBLE SIGMOIDOSCOPY  1999   negative  . KNEE ARTHROSCOPY  1984   right    Allergies  Allergen Reactions  . Codeine Anaphylaxis    Mental status changes  . Crestor [Rosuvastatin Calcium] Other (See Comments)    Severe fatigue  . Pepcid [Famotidine] Shortness Of Breath  . Niacin Other (See Comments)    flushing    Social History   Social History  . Marital status: Married    Spouse name: N/A  . Number of children: N/A  . Years of education: N/A   Occupational History  . Not on file.   Social History Main Topics  . Smoking status: Former Smoker    Packs/day: 1.00    Years: 13.00    Types: Cigarettes    Quit date: 10/29/1978  . Smokeless tobacco: Never Used     Comment: smoked 1967-1980, up to 1 ppd  . Alcohol use No  . Drug use: No  .  Sexual activity: Not on file   Other Topics Concern  . Not on file   Social History Narrative   Originally from [place]. Moved to [place] in 1951. He moved to [place] in 1984. 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 Previously has traveled to [place], [place], [place], [place], [place], and [place]. Currently has a cockatiel. No mold exposure. Has a hot tub but doesn't use it regularly.     Family History  Problem Relation Age of Onset  . Stroke Father 69  . Alcohol abuse Father   . Asthma Brother   . Breast cancer Mother   . HIV Brother   . Prostate cancer Brother 6  . Breast cancer Maternal Aunt   . Breast cancer Maternal Grandmother   . Heart disease Neg Hx   . Diabetes Neg Hx     BP (!) 152/70   Pulse (!) 52   Ht 5\' 7"  (1.702 m)   Wt 156 lb (70.8 kg)   BMI 24.43 kg/m   Review of Systems: See HPI above.      Objective:  Physical Exam:  Gen: NAD, comfortable in exam room  Right knee: No gross deformity, ecchymoses, effusion.  VMO atrophy.  Preserved long arches. No TTP. FROM. 5-/5 hip abduction Negative ant/post drawers. Negative valgus/varus testing. Negative lachmanns. Negative mcmurrays, apleys, patellar apprehension. NV intact distally.  Left knee: No gross deformity, ecchymoses, effusion.  VMO atrophy.  Preserved long arches. No TTP. FROM. 5-/5 hip abduction strength. Negative ant/post drawers. Negative valgus/varus testing. Negative lachmanns. Negative mcmurrays, apleys, patellar apprehension. NV intact distally.   Assessment & Plan:  1. Bilateral knee pain - consistent with patellofemoral syndrome, less likely DJD.  Shown home exercises to do daily.  Discussed arch supports which may help but his long arches are well preserved.  Relative rest reviewed and how to advance this.  Tylenol, ibuprofen, icing, topical medicines, supplements reviewed.  F/u in 5-6 wees.

## 2017-06-18 NOTE — Assessment & Plan Note (Signed)
consistent with patellofemoral syndrome, less likely DJD.  Shown home exercises to do daily.  Discussed arch supports which may help but his long arches are well preserved.  Relative rest reviewed and how to advance this.  Tylenol, ibuprofen, icing, topical medicines, supplements reviewed.  F/u in 5-6 wees.

## 2017-07-03 ENCOUNTER — Ambulatory Visit
Admission: RE | Admit: 2017-07-03 | Discharge: 2017-07-03 | Disposition: A | Discharge status: Home / Self Care | Payer: Medicare Other | Source: Ambulatory Visit | Attending: Sports Medicine | Admitting: Sports Medicine

## 2017-07-03 ENCOUNTER — Ambulatory Visit (INDEPENDENT_AMBULATORY_CARE_PROVIDER_SITE_OTHER): Payer: Medicare Other | Admitting: Sports Medicine

## 2017-07-03 ENCOUNTER — Encounter: Payer: Self-pay | Admitting: Sports Medicine

## 2017-07-03 VITALS — BP 124/60 | Ht 67.0 in | Wt 156.0 lb

## 2017-07-03 DIAGNOSIS — M25561 Pain in right knee: Secondary | ICD-10-CM | POA: Diagnosis not present

## 2017-07-03 DIAGNOSIS — M25562 Pain in left knee: Secondary | ICD-10-CM

## 2017-07-03 NOTE — Progress Notes (Addendum)
   Subjective:    Patient ID: Trevor [name], male    DOB: 1946-01-29, 71 y.o.   MRN: [id number]  HPI chief complaint: Right knee pain  Trevor [name] comes in today complaining of several weeks of right knee pain. He is currently training for the [employer] marathon. While training, he began to notice some anterior knee pain. He was seen by Dr. Pearletha ForgeHudnall and diagnosed with patellofemoral pain syndrome. He was given a series of exercises to do including isometric quad exercises but he has been doing primarily hip strengthening exercises. He states that he is able to get about 6 miles into his run without too much discomfort. However, he went for a 10 mile run this past weekend and had pain for the last 4 miles. He was able to complete his run. Ice helps tremendously. He localizes his pain to the anterior knee. He has not noticed any swelling. No mechanical symptoms. He has similar symptoms in the left knee although not as severe. He has not had any imaging. He has purchased some well cushioned running shoes.     Review of Systems    As above  Objective:   Physical Exam  Well-developed, well-nourished. No acute distress.  Right knee: Full range of motion. No effusion. 1+ patellofemoral crepitus. He is tender to palpation under the lateral patellar facet. No tenderness along the medial or lateral joint lines. Negative McMurray's. Good joint stability. Neurovascularly intact distally.  Left knee: Full range of motion. No effusion. No significant patellofemoral crepitus. No tenderness to palpation under the lateral facet. No tenderness along the medial lateral joint lines. Negative McMurray's. Neurovascularly intact distally.  Evaluation of the patient's running gait shows good form with no limp.        Assessment & Plan:  Bilateral anterior knee pain, right greater than left, secondary to chondromalacia patella versus patellofemoral DJD  X-rays of both knees including sunrise views to evaluate  for the degree of patellofemoral DJD. Patient is reeducated in isometric quad exercises with instructions to start doing them daily. He will continue with his hip strengthening exercises as well. He will also add hamstring strengthening exercises. I think he is okay to continue running using pain as his guide but I'm not sure that he will be able to compete in his upcoming marathon simply due to the fact that his knee pain may not allow him to continue training at the level needed to run that race. I did discuss the possibility of changing it to half marathon or postponing until next year. He will continue with icing as needed and I will follow-up with him via telephone with results of his x-rays once available.  Addendum: X-rays reviewed. He has a mild amount of patellofemoral DJD particularly on the right. This is best seen on the sunrise view. Tibiofemoral joint is well-preserved.

## 2017-07-08 ENCOUNTER — Telehealth: Payer: Self-pay | Admitting: Sports Medicine

## 2017-07-08 NOTE — Telephone Encounter (Signed)
  I spoke with this patient on the phone last week after reviewing x-rays of his knees. The x-rays show mild patellofemoral DJD, right greater than left. The left knee x-ray also shows a periosteal reaction in the proximal fibula which may be a healing stress fracture. Please note that the radiologist mistakingly labeled this as a tibial stress fracture but it is clearly a periosteal reaction of the proximal fibular shaft. Nonetheless, on my exam today the patient has no tenderness to palpation in this area. An ultrasound evaluation showed no evidence of cortical irregularity to suggest a stress fracture. Even if this is a stress fracture he has good callus on his x-ray. He does have bilateral joint effusions on US which are likely a result of his patellofemoral DJD. I've given him a body helix compression sleeve and he will continue to try to train for his marathon. I did reassure him that the majority of his knees look great from the standpoint of osteoarthritis but that he should not push through a lot of pain while training for his marathon. He understands. Follow-up as needed.

## 2017-08-12 ENCOUNTER — Ambulatory Visit (INDEPENDENT_AMBULATORY_CARE_PROVIDER_SITE_OTHER): Payer: Medicare Other | Admitting: Sports Medicine

## 2017-08-12 DIAGNOSIS — M1711 Unilateral primary osteoarthritis, right knee: Secondary | ICD-10-CM

## 2017-08-12 NOTE — Progress Notes (Signed)
  Trevor [name] is here today for our knee osteoarthritis study. No formal office note needed for this visit.

## 2017-08-13 ENCOUNTER — Encounter: Payer: Self-pay | Admitting: Sports Medicine

## 2017-09-04 ENCOUNTER — Ambulatory Visit: Payer: Medicare Other | Admitting: Sports Medicine

## 2017-09-06 ENCOUNTER — Ambulatory Visit (INDEPENDENT_AMBULATORY_CARE_PROVIDER_SITE_OTHER): Payer: Medicare Other | Admitting: Adult Health

## 2017-09-06 ENCOUNTER — Encounter: Payer: Self-pay | Admitting: Adult Health

## 2017-09-06 VITALS — BP 98/60 | Temp 97.5°F | Ht 67.0 in | Wt 155.0 lb

## 2017-09-06 DIAGNOSIS — Z0000 Encounter for general adult medical examination without abnormal findings: Secondary | ICD-10-CM

## 2017-09-06 DIAGNOSIS — Z125 Encounter for screening for malignant neoplasm of prostate: Secondary | ICD-10-CM | POA: Diagnosis not present

## 2017-09-06 DIAGNOSIS — E785 Hyperlipidemia, unspecified: Secondary | ICD-10-CM

## 2017-09-06 LAB — CBC WITH DIFFERENTIAL/PLATELET
BASOS PCT: 1.1 % (ref 0.0–3.0)
Basophils Absolute: 0.1 10*3/uL (ref 0.0–0.1)
EOS ABS: 0.2 10*3/uL (ref 0.0–0.7)
EOS PCT: 3.3 % (ref 0.0–5.0)
HCT: 40.4 % (ref 39.0–52.0)
Hemoglobin: 13.5 g/dL (ref 13.0–17.0)
LYMPHS ABS: 2 10*3/uL (ref 0.7–4.0)
Lymphocytes Relative: 36.9 % (ref 12.0–46.0)
MCHC: 33.5 g/dL (ref 30.0–36.0)
MCV: 93.9 fl (ref 78.0–100.0)
MONO ABS: 0.5 10*3/uL (ref 0.1–1.0)
Monocytes Relative: 9.5 % (ref 3.0–12.0)
Neutro Abs: 2.6 10*3/uL (ref 1.4–7.7)
Neutrophils Relative %: 49.2 % (ref 43.0–77.0)
PLATELETS: 264 10*3/uL (ref 150.0–400.0)
RBC: 4.3 Mil/uL (ref 4.22–5.81)
RDW: 12.9 % (ref 11.5–15.5)
WBC: 5.4 10*3/uL (ref 4.0–10.5)

## 2017-09-06 LAB — LIPID PANEL
CHOL/HDL RATIO: 4
CHOLESTEROL: 211 mg/dL — AB (ref 0–200)
HDL: 47.7 mg/dL (ref >39.00)
LDL CALC: 150 mg/dL — AB (ref 0–99)
NonHDL: 163.51
TRIGLYCERIDES: 68 mg/dL (ref 0.0–149.0)
VLDL: 13.6 mg/dL (ref 0.0–40.0)

## 2017-09-06 LAB — BASIC METABOLIC PANEL
BUN: 26 mg/dL — AB (ref 6–23)
CHLORIDE: 100 meq/L (ref 96–112)
CO2: 30 meq/L (ref 19–32)
CREATININE: 1 mg/dL (ref 0.40–1.50)
Calcium: 9 mg/dL (ref 8.4–10.5)
GFR: 78.6 mL/min (ref >60.00)
GLUCOSE: 90 mg/dL (ref 70–99)
Potassium: 4.2 mEq/L (ref 3.5–5.1)
Sodium: 137 mEq/L (ref 135–145)

## 2017-09-06 LAB — HEPATIC FUNCTION PANEL
ALT: 21 U/L (ref 0–53)
AST: 26 U/L (ref 0–37)
Albumin: 4.1 g/dL (ref 3.5–5.2)
Alkaline Phosphatase: 44 U/L (ref 39–117)
BILIRUBIN DIRECT: 0.2 mg/dL (ref 0.0–0.3)
BILIRUBIN TOTAL: 1.4 mg/dL — AB (ref 0.2–1.2)
Total Protein: 6.4 g/dL (ref 6.0–8.3)

## 2017-09-06 LAB — PSA: PSA: 1.49 ng/mL (ref 0.10–4.00)

## 2017-09-06 LAB — MAGNESIUM: Magnesium: 2.1 mg/dL (ref 1.5–2.5)

## 2017-09-06 NOTE — Progress Notes (Addendum)
Subjective:    Patient ID: Trevor [name], male    DOB: Jul 13, 1946, 71 y.o.   MRN: [id number]  HPI  Patient presents for yearly preventative medicine examination. He is a pleasant 71 year old male who  has a past medical history of Acute pericarditis, CAD (coronary artery disease), Colonic polyp, Gilbert syndrome, and Hyperlipidemia.   He takes Fish oil and daily ASA for hyperlipidemia. He has used statin in the past but they caused fatigue and myalgia.    All immunizations and health maintenance protocols were reviewed with the patient and needed orders were placed. He does not want his flu shot today   Appropriate screening laboratory values were ordered for the patient including screening of hyperlipidemia, renal function and hepatic function. If indicated by BPH, a PSA was ordered.  Medication reconciliation,  past medical history, social history, problem list and allergies were reviewed in detail with the patient  Goals were established with regard to weight loss, exercise, and  diet in compliance with medications. He is active and enjoys running for about 30 miles a week. He is happy to report that he recently finished the [employer] [place] in [place].   He eats a heart healthy diet   End of life planning was discussed. He has an advanced directive and living will.   He is up to date on his colonoscopy, dental and vision exams.   Denies any acute complaints   Review of Systems  Constitutional: Negative.   HENT: Negative.   Eyes: Negative.   Respiratory: Negative.   Cardiovascular: Negative.   Gastrointestinal: Negative.   Endocrine: Negative.   Genitourinary: Negative.   Musculoskeletal: Negative.   Skin: Negative.   Allergic/Immunologic: Negative.   Neurological: Negative.   Hematological: Negative.   Psychiatric/Behavioral: Negative.   All other systems reviewed and are negative.  Past Medical History:  Diagnosis Date  . Acute pericarditis    a. admitted  as a code STEMI but no sig CAD on cath.   . CAD (coronary artery disease)    30% stenosis mid RCA  . Colonic polyp    Dr. Kinnie ScalesMedoff; hyperplastic polyp  . Gilbert syndrome   . Hyperlipidemia     Social History   Socioeconomic History  . Marital status: Married    Spouse name: Not on file  . Number of children: Not on file  . Years of education: Not on file  . Highest education level: Not on file  Social Needs  . Financial resource strain: Not on file  . Food insecurity - worry: Not on file  . Food insecurity - inability: Not on file  . Transportation needs - medical: Not on file  . Transportation needs - non-medical: Not on file  Occupational History  . Not on file  Tobacco Use  . Smoking status: Former Smoker    Packs/day: 1.00    Years: 13.00    Pack years: 13.00    Types: Cigarettes    Last attempt to quit: 10/29/1978    Years since quitting: 38.8  . Smokeless tobacco: Never Used  . Tobacco comment: smoked 1967-1980, up to 1 ppd  Substance and Sexual Activity  . Alcohol use: No    Alcohol/week: 0.0 oz  . Drug use: No  . Sexual activity: Not on file  Other Topics Concern  . Not on file  Social History Narrative   Originally from [place]. Moved to [place] in 1951. He moved to [place] in 1984. Has worked doing speaking  engagements and workshops. Has also worked for [employer]. Previously has traveled to [place], [place], [place], [place], [place], [place], and [place]. Currently has a cockatiel. No mold exposure. Has a hot tub but doesn't use it regularly.     Past Surgical History:  Procedure Laterality Date  . actinic keratoses     facial removed x 2, Dr Danella DeisGruber  . basal cell cancer  2013 & 2015   Dr Campbell StallHope Gruber  . COLONOSCOPY  2014   negative  . COLONOSCOPY W/ POLYPECTOMY  2004   @ age 71, Dr Kinnie ScalesMedoff. ? Hyperplastic polyp  . FLEXIBLE SIGMOIDOSCOPY  1999   negative  . KNEE ARTHROSCOPY  1984   right    Family History  Problem Relation Age of Onset  . Stroke Father  [id number]  . Alcohol abuse Father   . Asthma Brother   . Breast cancer Mother   . HIV Brother   . Prostate cancer Brother [phone]  . Breast cancer Maternal Aunt   . Breast cancer Maternal Grandmother   . Heart disease Neg Hx   . Diabetes Neg Hx     Allergies  Allergen Reactions  . Codeine Anaphylaxis    Mental status changes  . Crestor [Rosuvastatin Calcium] Other (See Comments)    Severe fatigue  . Pepcid [Famotidine] Shortness Of Breath  . Prilosec [Omeprazole] Shortness Of Breath  . Niacin Other (See Comments)    flushing    Current Outpatient Medications on File Prior to Visit  Medication Sig Dispense Refill  . Ascorbic Acid (VITAMIN C PO) Take 2,000 mg by mouth daily.     Marland Kitchen. aspirin 81 MG tablet Take 81 mg by mouth daily.      . Cholecalciferol (VITAMIN D) 2000 UNITS CAPS Take 2,000 Units by mouth daily.     . fish oil-omega-3 fatty acids 1000 MG capsule Take 1 g by mouth daily.     . Magnesium 250 MG TABS Take 250 mg by mouth daily.     . metroNIDAZOLE (METROCREAM) 0.75 % cream Apply 1 application topically daily.    . VENTOLIN HFA 108 (90 Base) MCG/ACT inhaler INHALE 1 TO 2 PUFFS INTO THE LUNGS EVERY 15 TO 30 MINUTES PRIOR TO EXERCISE 18 g 0   No current facility-administered medications on file prior to visit.     BP 98/60 (BP Location: Left Arm)   Temp (!) 97.5 F (36.4 C) (Oral)   Ht 5\' 7"  (1.702 m)   Wt 155 lb (70.3 kg)   BMI 24.28 kg/m       Objective:   Physical Exam  Constitutional: He is oriented to person, place, and time. He appears well-developed and well-nourished. No distress.  HENT:  Head: Normocephalic and atraumatic.  Right Ear: External ear normal.  Left Ear: External ear normal.  Nose: Nose normal.  Mouth/Throat: Oropharynx is clear and moist. No oropharyngeal exudate.  Eyes: Conjunctivae and EOM are normal. Pupils are equal, round, and reactive to light. Right eye exhibits no discharge. Left eye exhibits no discharge. No scleral icterus.  Neck:  Normal range of motion. Neck supple. No JVD present. Carotid bruit is not present. No tracheal deviation present. No thyromegaly present.  Cardiovascular: Normal rate, regular rhythm, normal heart sounds and intact distal pulses. Exam reveals no gallop and no friction rub.  No murmur heard. Pulmonary/Chest: Effort normal and breath sounds normal. No stridor. No respiratory distress. He has no wheezes. He has no rales. He exhibits no tenderness.  Abdominal: Soft.  Bowel sounds are normal. He exhibits no distension and no mass. There is no tenderness. There is no rebound and no guarding.  Musculoskeletal: Normal range of motion. He exhibits no edema, tenderness or deformity.  Lymphadenopathy:    He has no cervical adenopathy.  Neurological: He is alert and oriented to person, place, and time. He has normal reflexes. He displays normal reflexes. No cranial nerve deficit. He exhibits normal muscle tone. Coordination normal.  Skin: Skin is warm and dry. No rash noted. He is not diaphoretic. No erythema. No pallor.  Psychiatric: He has a normal mood and affect. His behavior is normal. Judgment and thought content normal.  Nursing note and vitals reviewed.     Assessment & Plan:  1. Routine general medical examination at a health care facility - Benign exam  - Continue to exercise and eat healthy  - Basic metabolic panel - CBC with Differential/Platelet - Hepatic function panel - Lipid panel - PSA - Magnesium  2. Hyperlipidemia with target LDL less than 70 - Consider different statin if needed - Basic metabolic panel - CBC with Differential/Platelet - Hepatic function panel - Lipid panel - PSA   Shirline Frees, NP

## 2017-09-06 NOTE — Patient Instructions (Signed)
It was great seeing you today   Congrats on finishing the marathon.   I will follow up with you regarding your blood work   Schedule your annual wellness exam with Trevor [name]   If you need anything, please let me know   Health Maintenance, Male A healthy lifestyle and preventative care can promote health and wellness.  Maintain regular health, dental, and eye exams.  Eat a healthy diet. Foods like vegetables, fruits, whole grains, low-fat dairy products, and lean protein foods contain the nutrients you need and are low in calories. Decrease your intake of foods high in solid fats, added sugars, and salt. Get information about a proper diet from your health care provider, if necessary.  Regular physical exercise is one of the most important things you can do for your health. Most adults should get at least 150 minutes of moderate-intensity exercise (any activity that increases your heart rate and causes you to sweat) each week. In addition, most adults need muscle-strengthening exercises on 2 or more days a week.   Maintain a healthy weight. The body mass index (BMI) is a screening tool to identify possible weight problems. It provides an estimate of body fat based on height and weight. Your health care provider can find your BMI and can help you achieve or maintain a healthy weight. For males 20 years and older:  A BMI below 18.5 is considered underweight.  A BMI of 18.5 to 24.9 is normal.  A BMI of 25 to 29.9 is considered overweight.  A BMI of 30 and above is considered obese.  Maintain normal blood lipids and cholesterol by exercising and minimizing your intake of saturated fat. Eat a balanced diet with plenty of fruits and vegetables. Blood tests for lipids and cholesterol should begin at age 620 and be repeated every 5 years. If your lipid or cholesterol levels are high, you are over age 69, or you are at high risk for heart disease, you may need your cholesterol levels checked more  frequently.Ongoing high lipid and cholesterol levels should be treated with medicines if diet and exercise are not working.  If you smoke, find out from your health care provider how to quit. If you do not use tobacco, do not start.  Lung cancer screening is recommended for adults aged 55-80 years who are at high risk for developing lung cancer because of a history of smoking. A yearly low-dose CT scan of the lungs is recommended for people who have at least a 30-pack-year history of smoking and are current smokers or have quit within the past 15 years. A pack year of smoking is smoking an average of 1 pack of cigarettes a day for 1 year (for example, a 30-pack-year history of smoking could mean smoking 1 pack a day for 30 years or 2 packs a day for 15 years). Yearly screening should continue until the smoker has stopped smoking for at least 15 years. Yearly screening should be stopped for people who develop a health problem that would prevent them from having lung cancer treatment.  If you choose to drink alcohol, do not have more than 2 drinks per day. One drink is considered to be 12 oz (360 mL) of beer, 5 oz (150 mL) of wine, or 1.5 oz (45 mL) of liquor.  Avoid the use of street drugs. Do not share needles with anyone. Ask for help if you need support or instructions about stopping the use of drugs.  High blood pressure causes heart  disease and increases the risk of stroke. High blood pressure is more likely to develop in:  People who have blood pressure in the end of the normal range (100-139/85-89 mm Hg).  People who are overweight or obese.  People who are African American.  If you are 69-14 years of age, have your blood pressure checked every 3-5 years. If you are 63 years of age or older, have your blood pressure checked every year. You should have your blood pressure measured twice--once when you are at a hospital or clinic, and once when you are not at a hospital or clinic. Record the  average of the two measurements. To check your blood pressure when you are not at a hospital or clinic, you can use:  An automated blood pressure machine at a pharmacy.  A home blood pressure monitor.  If you are 62-68 years old, ask your health care provider if you should take aspirin to prevent heart disease.  Diabetes screening involves taking a blood sample to check your fasting blood sugar level. This should be done once every 3 years after age 51 if you are at a normal weight and without risk factors for diabetes. Testing should be considered at a younger age or be carried out more frequently if you are overweight and have at least 1 risk factor for diabetes.  Colorectal cancer can be detected and often prevented. Most routine colorectal cancer screening begins at the age of 73 and continues through age 11. However, your health care provider may recommend screening at an earlier age if you have risk factors for colon cancer. On a yearly basis, your health care provider may provide home test kits to check for hidden blood in the stool. A small camera at the end of a tube may be used to directly examine the colon (sigmoidoscopy or colonoscopy) to detect the earliest forms of colorectal cancer. Talk to your health care provider about this at age 58 when routine screening begins. A direct exam of the colon should be repeated every 5-10 years through age 77, unless early forms of precancerous polyps or small growths are found.  People who are at an increased risk for hepatitis B should be screened for this virus. You are considered at high risk for hepatitis B if:  You were born in a country where hepatitis B occurs often. Talk with your health care provider about which countries are considered high risk.  Your parents were born in a high-risk country and you have not received a shot to protect against hepatitis B (hepatitis B vaccine).  You have HIV or AIDS.  You use needles to inject street  drugs.  You live with, or have sex with, someone who has hepatitis B.  You are a man who has sex with other men (MSM).  You get hemodialysis treatment.  You take certain medicines for conditions like cancer, organ transplantation, and autoimmune conditions.  Hepatitis C blood testing is recommended for all people born from [birth year] through 1963 and any individual with known risk factors for hepatitis C.  Healthy men should no longer receive prostate-specific antigen (PSA) blood tests as part of routine cancer screening. Talk to your health care provider about prostate cancer screening.  Testicular cancer screening is not recommended for adolescents or adult males who have no symptoms. Screening includes self-exam, a health care provider exam, and other screening tests. Consult with your health care provider about any symptoms you have or any concerns you have about testicular  cancer.  Practice safe sex. Use condoms and avoid high-risk sexual practices to reduce the spread of sexually transmitted infections (STIs).  You should be screened for STIs, including gonorrhea and chlamydia if:  You are sexually active and are younger than 24 years.  You are older than 24 years, and your health care provider tells you that you are at risk for this type of infection.  Your sexual activity has changed since you were last screened, and you are at an increased risk for chlamydia or gonorrhea. Ask your health care provider if you are at risk.  If you are at risk of being infected with HIV, it is recommended that you take a prescription medicine daily to prevent HIV infection. This is called pre-exposure prophylaxis (PrEP). You are considered at risk if:  You are a man who has sex with other men (MSM).  You are a heterosexual man who is sexually active with multiple partners.  You take drugs by injection.  You are sexually active with a partner who has HIV.  Talk with your health care provider about  whether you are at high risk of being infected with HIV. If you choose to begin PrEP, you should first be tested for HIV. You should then be tested every 3 months for as long as you are taking PrEP.  Use sunscreen. Apply sunscreen liberally and repeatedly throughout the day. You should seek shade when your shadow is shorter than you. Protect yourself by wearing long sleeves, pants, a wide-brimmed hat, and sunglasses year round whenever you are outdoors.  Tell your health care provider of new moles or changes in moles, especially if there is a change in shape or color. Also, tell your health care provider if a mole is larger than the size of a pencil eraser.  A one-time screening for abdominal aortic aneurysm (AAA) and surgical repair of large AAAs by ultrasound is recommended for men aged 41-75 years who are current or former smokers.  Stay current with your vaccines (immunizations).   This information is not intended to replace advice given to you by your health care provider. Make sure you discuss any questions you have with your health care provider.   Document Released: 04/12/2008 Document Revised: 11/05/2014 Document Reviewed: 03/12/2011 Elsevier Interactive Patient Education [employer].

## 2017-09-10 NOTE — Progress Notes (Signed)
Subjective:   Trevor [name] is a 71 y.o. male who presents for Medicare Annual/Subsequent preventive examination.  The Patient was informed that the wellness visit is to identify future health risk and educate and initiate measures that can reduce risk for increased disease through the lifespan.    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   VS reviewed;   Tobacco 13 years w 13 pack years Does not smoke at all  Due to 100 cig in his lifetime; discussed AAA but will defer to cardiology who followed last year with echo's  Episode viral pericarditis   Does not drink coffee anymore No ETOH   Diet  Discussed eating vegan with foods that will work to lower the cholesterol  Will stop eating sugars  Discussed monitoring diet for food that lower LDL;  ONly 30 % of patients will respond to diet Will make apt to discuss the options   BMI 24,7   Exercise Just ran [employer] to run  Does yoga Strength training   Hearing Screening Comments: Hearing issues none  Dr. Kelli Churn did hearing test and was fine  Vision Screening Comments: Vision checks  Wear bifocals for reading Apt next week Dr. Sammuel Hines    Dental - no issues  next week has apt   Stressors: no particular stressors   Sleep patterns: sleep well    Cardiac Risk Factors Addressed Cardiac cath 10/2015 - no fup necessary  Hyperlipidemia - ratio 4; chol 211; HDL 47; ldl 150; trig  6  Pre-diabetes - Bs 90  No diabetes in the family    Advanced Directives - completed   Patient Care Team: Shirline Frees, NP as PCP - General (Family Medicine)    Cardiac Risk Factors include: advanced age (>55men, >60 women);dyslipidemia     Objective:    Vitals: BP 100/60   Pulse 62   Ht 5\' 7"  (1.702 m)   Wt 158 lb 2 oz (71.7 kg)   SpO2 98%   BMI 24.77 kg/m   Body mass index is 24.77 kg/m.  Tobacco Social History   Tobacco Use  Smoking Status Former Smoker  . Packs/day: 1.00  . Years: 13.00  . Pack years: 13.00  . Types: Cigarettes  . Last attempt to quit: 10/29/1978  . Years since quitting: 38.8  Smokeless Tobacco Never Used  Tobacco Comment   smoked 1967-1980, up to 1 ppd     Counseling given: Yes Comment: smoked 1967-1980, up to 1 ppd   Past Medical History:  Diagnosis Date  . Acute pericarditis    a. admitted as a code STEMI but no sig CAD on cath.   . CAD (coronary artery disease)    30% stenosis mid RCA  . Colonic polyp    Dr. Kinnie Scales; hyperplastic polyp  . Gilbert syndrome   . Hyperlipidemia    Past Surgical History:  Procedure Laterality Date  . actinic keratoses     facial removed x 2, Dr Danella Deis  .  basal cell cancer  2013 & 2015   Dr Campbell StallHope Gruber  . COLONOSCOPY  2014   negative  . COLONOSCOPY W/ POLYPECTOMY  2004   @ age 71, Dr Kinnie ScalesMedoff. ? Hyperplastic polyp  . FLEXIBLE SIGMOIDOSCOPY  1999   negative  . KNEE ARTHROSCOPY  1984   right   Family History  Problem Relation Age of Onset  . Stroke Father [id number]  . Alcohol abuse Father   . Asthma Brother   . Breast cancer Mother   . HIV Brother   . Prostate cancer Brother [id number]  . Breast cancer Maternal Aunt   . Breast cancer Maternal Grandmother   . Heart disease Neg Hx   . Diabetes Neg Hx    Social History   Substance and Sexual Activity  Sexual Activity Not on file    Outpatient Encounter Medications as of 09/11/2017  Medication Sig  . Ascorbic Acid (VITAMIN C PO) Take 2,000 mg by  mouth daily.   Marland Kitchen. aspirin 81 MG tablet Take 81 mg by mouth daily.    . Cholecalciferol (VITAMIN D) 2000 UNITS CAPS Take 2,000 Units by mouth daily.   . fish oil-omega-3 fatty acids 1000 MG capsule Take 1 g by mouth daily.   . Magnesium 250 MG TABS Take 250 mg by mouth daily.   . metroNIDAZOLE (METROCREAM) 0.75 % cream Apply 1 application topically daily.  . NON FORMULARY   . VENTOLIN HFA 108 (90 Base) MCG/ACT inhaler INHALE 1 TO 2 PUFFS INTO THE LUNGS EVERY 15 TO 30 MINUTES PRIOR TO EXERCISE (Patient not taking: Reported on 09/11/2017)   No facility-administered encounter medications on file as of 09/11/2017.     Activities of Daily Living In your present state of health, do you have any difficulty performing the following activities: 09/11/2017  Hearing? N  Vision? N  Difficulty concentrating or making decisions? N  Walking or climbing stairs? N  Dressing or bathing? N  Doing errands, shopping? N  Preparing Food and eating ? N  Using the Toilet? N  In the past six months, have you accidently leaked urine? N  Do you have problems with loss of bowel control? N  Managing your Medications? N  Managing your Finances? N  Housekeeping or managing your Housekeeping? N  Some recent data might be hidden    Patient Care Team: Shirline FreesNafziger, Cory, NP as PCP - General (Family Medicine)   Assessment:     Exercise Activities and Dietary recommendations Current Exercise Habits: Structured exercise class, Time (Minutes): > 60, Frequency (Times/Week): 5, Weekly Exercise (Minutes/Week): 0, Intensity: Moderate  Goals    None     Fall Risk Fall Risk  09/11/2017 07/03/2017 09/05/2016 09/26/2015 09/26/2015  Falls in the past year? No No No No No  Risk for fall due to : - Other (Comment) - Other (Comment) Other (Comment)   Depression Screen PHQ 2/9 Scores 09/11/2017 07/03/2017 09/05/2016 09/26/2015  PHQ - 2 Score 0 - 0 0  Exception Documentation - Other- indicate reason in comment box - Other-  indicate reason in comment box    Cognitive Function MMSE - Mini Mental State Exam 09/11/2017  Not completed: (No Data)     Ad8 score reviewed for issues:  Issues making decisions:  Less interest in hobbies / activities:  Repeats questions, stories (family complaining):  Trouble using ordinary gadgets (microwave, computer, phone):  Forgets the month or year:   Mismanaging finances:   Remembering appts:  Daily problems with thinking and/or memory: Ad8  score is=0        Immunization History  Administered Date(s) Administered  . Pneumococcal Conjugate-13 10/21/2015  . Pneumococcal Polysaccharide-23 06/13/2017  . Td 09/09/2008  . Zoster 09/17/2014   Screening Tests Health Maintenance  Topic Date Due  . INFLUENZA VACCINE  03/28/2018 (Originally 05/29/2017)  . TETANUS/TDAP  09/09/2018  . COLONOSCOPY  08/30/2023  . Hepatitis C Screening  Completed  . PNA vac Low Risk Adult  Completed      Plan:     PCP Notes   Health Maintenance Waive AAA due to smoking hx due to cardiac work up last year; Please advise if this was sufficient.   Up to date on all preventive health   To take his shingrix vaccines in the near future   Abnormal Screens  LDL and discussed options; diet and new meds. Explained diet is only 30% effective  [facility]   Referrals  None  Patient concerns; His LDL; will cut back on sugar   Nurse Concerns; As noted   Next PCP apt Tbs soon to discuss LDL       I have personally reviewed and noted the following in the patient's chart:   . Medical and social history . Use of alcohol, tobacco or illicit drugs  . Current medications and supplements . Functional ability and status . Nutritional status . Physical activity . Advanced directives . List of other physicians . Hospitalizations, surgeries, and ER visits in previous 12 months . Vitals . Screenings to include cognitive, depression, and  falls . Referrals and appointments  In addition, I have reviewed and discussed with patient certain preventive protocols, quality metrics, and best practice recommendations. A written personalized care plan for preventive services as well as general preventive health recommendations were provided to patient.     Montine CircleHauck,Joshiah Traynham, RN  09/11/2017

## 2017-09-11 ENCOUNTER — Ambulatory Visit: Payer: Medicare Other

## 2017-09-11 VITALS — BP 100/60 | HR 62 | Ht 67.0 in | Wt 158.1 lb

## 2017-09-11 DIAGNOSIS — Z0000 Encounter for general adult medical examination without abnormal findings: Secondary | ICD-10-CM

## 2017-09-11 NOTE — Patient Instructions (Addendum)
Trevor [name] , Thank you for taking time to come for your Medicare Wellness Visit. I appreciate your ongoing commitment to your health goals. Please review the following plan we discussed and let me know if I can assist you in the future.   Shingrix is a vaccine for the prevention of Shingles in Adults 50 and older.  If you are on Medicare, you can request a prescription from your doctor to be filled at a pharmacy.  Please check with your benefits regarding applicable copays or out of pocket expenses.  The Shingrix is given in 2 vaccines approx 8 weeks apart. You must receive the 2nd dose prior to 6 months from receipt of the first.   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 Goals    None      This is a list of the screening recommended for you and due dates:  Health Maintenance  Topic Date Due  . Flu Shot  03/28/2018*  . Tetanus Vaccine  09/09/2018  . Colon Cancer Screening  08/30/2023  .  Hepatitis C: One time screening is recommended by Center for Disease Control  (CDC) for  adults born from [birth year] through 1963.   Completed  . Pneumonia vaccines  Completed  *Topic was postponed. The date shown is not the original due date.    Learn about the Yellow Dot program:  The program allows first responders at your emergency to have access to who your physician is, as well as your medications and medical conditions.  Citizens requesting the Yellow Dot Packages should contact Master Corporal Gae Dry at the [facility] [phone] for the first week of the program and beginning the week after Easter citizens should contact their Water engineer.  Prevention of falls: Remove rugs or any tripping hazards in the home Use Non slip mats in bathtubs and showers Placing grab bars next to the toilet and or shower Placing handrails on both sides of the stair way Adding extra lighting in the home.   Personal safety  issues reviewed:  1. Consider starting a community watch program per [facility] 2.  Changes batteries is smoke detector and/or carbon monoxide detector  3.  If you have firearms; keep them in a safe place 4.  Wear protection when in the sun; Always wear sunscreen or a hat; It is good to have your doctor check your skin annually or review any new areas of concern 5. Driving safety; Keep in the right lane; stay 3 car lengths behind the car in front of you on the highway; look 3 times prior to pulling out; carry your cell phone everywhere you go!    Learn about the Yellow Dot program:  The program allows first responders at your emergency to have access to who your physician is, as well as your medications and medical conditions.  Citizens requesting the Yellow Dot Packages should contact Master Corporal Gae Dry at the [facility] [phone] for the first week of the program and beginning the week after Easter citizens should contact their Water engineer.  Fall Prevention in the Home Falls can cause injuries and can affect people from all age groups. There are many simple things that you can do to make your home safe and to help prevent falls. What can I do on the outside of my home?  Regularly repair the edges of walkways and driveways and fix any cracks.  Remove high doorway thresholds.  Trim any shrubbery on the main path into your home.  Use bright outdoor lighting.  Clear walkways of debris and clutter, including tools and rocks.  Regularly check that handrails are securely fastened and in good repair. Both sides of any steps should have handrails.  Install guardrails along the edges of any raised decks or porches.  Have leaves, snow, and ice cleared regularly.  Use sand or salt on walkways during winter months.  In the garage, clean up any spills right away, including grease or oil spills. What can I do in the  bathroom?  Use night lights.  Install grab bars by the toilet and in the tub and shower. Do not use towel bars as grab bars.  Use non-skid mats or decals on the floor of the tub or shower.  If you need to sit down while you are in the shower, use a plastic, non-slip stool.  Keep the floor dry. Immediately clean up any water that spills on the floor.  Remove soap buildup in the tub or shower on a regular basis.  Attach bath mats securely with double-sided non-slip rug tape.  Remove throw rugs and other tripping hazards from the floor. What can I do in the bedroom?  Use night lights.  Make sure that a bedside light is easy to reach.  Do not use oversized bedding that drapes onto the floor.  Have a firm chair that has side arms to use for getting dressed.  Remove throw rugs and other tripping hazards from the floor. What can I do in the kitchen?  Clean up any spills right away.  Avoid walking on wet floors.  Place frequently used items in easy-to-reach places.  If you need to reach for something above you, use a sturdy step stool that has a grab bar.  Keep electrical cables out of the way.  Do not use floor polish or wax that makes floors slippery. If you have to use wax, make sure that it is non-skid floor wax.  Remove throw rugs and other tripping hazards from the floor. What can I do in the stairways?  Do not leave any items on the stairs.  Make sure that there are handrails on both sides of the stairs. Fix handrails that are broken or loose. Make sure that handrails are as long as the stairways.  Check any carpeting to make sure that it is firmly attached to the stairs. Fix any carpet that is loose or worn.  Avoid having throw rugs at the top or bottom of stairways, or secure the rugs with carpet tape to prevent them from moving.  Make sure that you have a light switch at the top of the stairs and the bottom of the stairs. If you do not have them, have them  installed. What are some other fall prevention tips?  Wear closed-toe shoes that fit well and support your feet. Wear shoes that have rubber soles or low heels.  When you use a stepladder, make sure that it is completely opened and that the sides are firmly locked. Have someone hold the ladder while you are using it. Do not climb a closed stepladder.  Add color or contrast paint or tape to grab bars and handrails in your home. Place contrasting color strips on the first and last steps.  Use mobility aids as needed, such as canes, walkers, scooters, and crutches.  Turn on lights if it is dark. Replace any light bulbs that  burn out.  Set up furniture so that there are clear paths. Keep the furniture in the same spot.  Fix any uneven floor surfaces.  Choose a carpet design that does not hide the edge of steps of a stairway.  Be aware of any and all pets.  Review your medicines with your healthcare provider. Some medicines can cause dizziness or changes in blood pressure, which increase your risk of falling. Talk with your health care provider about other ways that you can decrease your risk of falls. This may include working with a physical therapist or trainer to improve your strength, balance, and endurance. This information is not intended to replace advice given to you by your health care provider. Make sure you discuss any questions you have with your health care provider. Document Released: 10/05/2002 Document Revised: 03/13/2016 Document Reviewed: 11/19/2014 Elsevier Interactive Patient Education  2017 Elsevier Inc.    Health Maintenance, Male A healthy lifestyle and preventive care is important for your health and wellness. Ask your health care provider about what schedule of regular examinations is right for you. What should I know about weight and diet? Eat a Healthy Diet  Eat plenty of vegetables, fruits, whole grains, low-fat dairy products, and lean protein.  Do not eat  a lot of foods high in solid fats, added sugars, or salt.  Maintain a Healthy Weight Regular exercise can help you achieve or maintain a healthy weight. You should:  Do at least 150 minutes of exercise each week. The exercise should increase your heart rate and make you sweat (moderate-intensity exercise).  Do strength-training exercises at least twice a week.  Watch Your Levels of Cholesterol and Blood Lipids  Have your blood tested for lipids and cholesterol every 5 years starting at 69 years of age. If you are at high risk for heart disease, you should start having your blood tested when you are 68 years old. You may need to have your cholesterol levels checked more often if: ? Your lipid or cholesterol levels are high. ? You are older than 69 years of age. ? You are at high risk for heart disease.  What should I know about cancer screening? Many types of cancers can be detected early and may often be prevented. Lung Cancer  You should be screened every year for lung cancer if: ? You are a current smoker who has smoked for at least 30 years. ? You are a former smoker who has quit within the past 15 years.  Talk to your health care provider about your screening options, when you should start screening, and how often you should be screened.  Colorectal Cancer  Routine colorectal cancer screening usually begins at 69 years of age and should be repeated every 5-10 years until you are 69 years old. You may need to be screened more often if early forms of precancerous polyps or small growths are found. Your health care provider may recommend screening at an earlier age if you have risk factors for colon cancer.  Your health care provider may recommend using home test kits to check for hidden blood in the stool.  A small camera at the end of a tube can be used to examine your colon (sigmoidoscopy or colonoscopy). This checks for the earliest forms of colorectal cancer.  Prostate and  Testicular Cancer  Depending on your age and overall health, your health care provider may do certain tests to screen for prostate and testicular cancer.  Talk to your health  care provider about any symptoms or concerns you have about testicular or prostate cancer.  Skin Cancer  Check your skin from head to toe regularly.  Tell your health care provider about any new moles or changes in moles, especially if: ? There is a change in a mole's size, shape, or color. ? You have a mole that is larger than a pencil eraser.  Always use sunscreen. Apply sunscreen liberally and repeat throughout the day.  Protect yourself by wearing long sleeves, pants, a wide-brimmed hat, and sunglasses when outside.  What should I know about heart disease, diabetes, and high blood pressure?  If you are 3718-69 years of age, have your blood pressure checked every 3-5 years. If you are 69 years of age or older, have your blood pressure checked every year. You should have your blood pressure measured twice-once when you are at a hospital or clinic, and once when you are not at a hospital or clinic. Record the average of the two measurements. To check your blood pressure when you are not at a hospital or clinic, you can use: ? An automated blood pressure machine at a pharmacy. ? A home blood pressure monitor.  Talk to your health care provider about your target blood pressure.  If you are between 2245-69 years old, ask your health care provider if you should take aspirin to prevent heart disease.  Have regular diabetes screenings by checking your fasting blood sugar level. ? If you are at a normal weight and have a low risk for diabetes, have this test once every three years after the age of 69. ? If you are overweight and have a high risk for diabetes, consider being tested at a younger age or more often.  A one-time screening for abdominal aortic aneurysm (AAA) by ultrasound is recommended for men aged 65-75 years  who are current or former smokers. What should I know about preventing infection? Hepatitis B If you have a higher risk for hepatitis B, you should be screened for this virus. Talk with your health care provider to find out if you are at risk for hepatitis B infection. Hepatitis C Blood testing is recommended for:  Everyone born from [id number] through 1963.  Anyone with known risk factors for hepatitis C.  Sexually Transmitted Diseases (STDs)  You should be screened each year for STDs including gonorrhea and chlamydia if: ? You are sexually active and are younger than 69 years of age. ? You are older than 69 years of age and your health care provider tells you that you are at risk for this type of infection. ? Your sexual activity has changed since you were last screened and you are at an increased risk for chlamydia or gonorrhea. Ask your health care provider if you are at risk.  Talk with your health care provider about whether you are at high risk of being infected with HIV. Your health care provider may recommend a prescription medicine to help prevent HIV infection.  What else can I do?  Schedule regular health, dental, and eye exams.  Stay current with your vaccines (immunizations).  Do not use any tobacco products, such as cigarettes, chewing tobacco, and e-cigarettes. If you need help quitting, ask your health care provider.  Limit alcohol intake to no more than 2 drinks per day. One drink equals 12 ounces of beer, 5 ounces of wine, or 1 ounces of hard liquor.  Do not use street drugs.  Do not share needles.  Ask  your health care provider for help if you need support or information about quitting drugs.  Tell your health care provider if you often feel depressed.  Tell your health care provider if you have ever been abused or do not feel safe at home. This information is not intended to replace advice given to you by your health care provider. Make sure you discuss any  questions you have with your health care provider. Document Released: 04/12/2008 Document Revised: 06/13/2016 Document Reviewed: 07/19/2015 Elsevier Interactive Patient Education  [employer].

## 2017-09-11 NOTE — Progress Notes (Signed)
I have reviewed and agree with this plan  

## 2017-09-16 ENCOUNTER — Ambulatory Visit (INDEPENDENT_AMBULATORY_CARE_PROVIDER_SITE_OTHER): Payer: Medicare Other | Admitting: Adult Health

## 2017-09-16 ENCOUNTER — Encounter: Payer: Self-pay | Admitting: Adult Health

## 2017-09-16 VITALS — BP 126/68 | Temp 98.3°F | Wt 156.0 lb

## 2017-09-16 DIAGNOSIS — E782 Mixed hyperlipidemia: Secondary | ICD-10-CM | POA: Diagnosis not present

## 2017-09-16 NOTE — Progress Notes (Signed)
Subjective:    Patient ID: Trevor [name], male    DOB: [birth year], 71 y.o.   MRN: [id number]  HPI  71 year old male who  has a past medical history of Acute pericarditis, CAD (coronary artery disease), Colonic polyp, Gilbert syndrome, and Hyperlipidemia. He presents to the office today for follow up regarding hyperlipidemia. His last labs showed values at  Lab Results  Component Value Date   CHOL 211 (H) 09/06/2017   HDL 47.70 09/06/2017   LDLCALC 150 (H) 09/06/2017   LDLDIRECT 159.2 08/31/2013   TRIG 68.0 09/06/2017   CHOLHDL 4 09/06/2017    He has been placed on Crestor in the past but had an intolerance to them. It was advised that since his 10 year cardiac risk was 12%, that we try adding a different statin.   Today in the office he reports that he does not want to try another statin. He would like to work on diet further and retest in 6 months   Review of Systems See HPI   Past Medical History:  Diagnosis Date  . Acute pericarditis    a. admitted as a code STEMI but no sig CAD on cath.   . CAD (coronary artery disease)    30% stenosis mid RCA  . Colonic polyp    Dr. Kinnie ScalesMedoff; hyperplastic polyp  . Gilbert syndrome   . Hyperlipidemia     Social History   Socioeconomic History  . Marital status: Married    Spouse name: Not on file  . Number of children: Not on file  . Years of education: Not on file  . Highest education level: Not on file  Social Needs  . Financial resource strain: Not on file  . Food insecurity - worry: Not on file  . Food insecurity - inability: Not on file  . Transportation needs - medical: Not on file  . Transportation needs - non-medical: Not on file  Occupational History  . Not on file  Tobacco Use  . Smoking status: Former Smoker    Packs/day: 1.00    Years: 13.00    Pack years: 13.00    Types: Cigarettes    Last attempt to quit: 10/29/1978    Years since quitting: 38.9  . Smokeless tobacco: Never Used  . Tobacco comment:  smoked 1967-1980, up to 1 ppd  Substance and Sexual Activity  . Alcohol use: No    Alcohol/week: 0.0 oz  . Drug use: No  . Sexual activity: Not on file  Other Topics Concern  . Not on file  Social History Narrative   Originally from [place]. Moved to [place] in 1951. He moved to  in 1984. 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 Previously has traveled to [place], [place], [place], [place], [place], [place] and [place]. Currently has a cockatiel. No mold exposure. Has a hot tub but doesn't use it regularly.     Past Surgical History:  Procedure Laterality Date  . actinic keratoses     facial removed x 2, Dr Danella DeisGruber  . basal cell cancer  2013 & 2015   Dr Campbell StallHope Gruber  . COLONOSCOPY  2014   negative  . COLONOSCOPY W/ POLYPECTOMY  2004   @ age 71, Dr Kinnie ScalesMedoff. ? Hyperplastic polyp  . FLEXIBLE SIGMOIDOSCOPY  1999   negative  . KNEE ARTHROSCOPY  1984   right  . Left Heart Cath and Coronary Angiography N/A 10/30/2015   Performed by Verne CarrowMcAlhany, Christopher  D, MD at [facility]    Family History  Problem Relation Age of Onset  . Stroke Father 1661  . Alcohol abuse Father   . Asthma Brother   . Breast cancer Mother   . HIV Brother   . Prostate cancer Brother 1854  . Breast cancer Maternal Aunt   . Breast cancer Maternal Grandmother   . Heart disease Neg Hx   . Diabetes Neg Hx     Allergies  Allergen Reactions  . Codeine Anaphylaxis    Mental status changes  . Crestor [Rosuvastatin Calcium] Other (See Comments)    Severe fatigue  . Pepcid [Famotidine] Shortness Of Breath  . Prilosec [Omeprazole] Shortness Of Breath  . Niacin Other (See Comments)    flushing    Current Outpatient Medications on File Prior to Visit  Medication Sig Dispense Refill  . Ascorbic Acid (VITAMIN C PO) Take 2,000 mg by mouth daily.     Marland Kitchen. aspirin 81 MG tablet Take 81 mg by mouth daily.      . Cholecalciferol (VITAMIN D) 2000 UNITS CAPS Take 2,000 Units  by mouth daily.     . fish oil-omega-3 fatty acids 1000 MG capsule Take 1 g by mouth daily.     . Magnesium 250 MG TABS Take 250 mg by mouth daily.     . metroNIDAZOLE (METROCREAM) 0.75 % cream Apply 1 application topically daily.    . NON FORMULARY     . VENTOLIN HFA 108 (90 Base) MCG/ACT inhaler INHALE 1 TO 2 PUFFS INTO THE LUNGS EVERY 15 TO 30 MINUTES PRIOR TO EXERCISE (Patient not taking: Reported on 09/11/2017) 18 g 0   No current facility-administered medications on file prior to visit.     There were no vitals taken for this visit.      Objective:   Physical Exam  Constitutional: He is oriented to person, place, and time. He appears well-developed and well-nourished. No distress.  Cardiovascular: Intact distal pulses.  Neurological: He is alert and oriented to person, place, and time.  Skin: Skin is warm and dry. He is not diaphoretic.  Psychiatric: He has a normal mood and affect. His behavior is normal. Judgment and thought content normal.  Nursing note and vitals reviewed.     Assessment & Plan:  1. Mixed hyperlipidemia - We discussed his options. He exercises frequently and eats pretty healthy. He has a low risk at 12%. I am ok with him wanting to work on diet. We can retest in 6 months per his wishes .  Shirline Freesory Inez Rosato, NP

## 2017-10-13 ENCOUNTER — Encounter: Payer: Self-pay | Admitting: Sports Medicine

## 2017-10-30 ENCOUNTER — Telehealth: Payer: Self-pay | Admitting: Cardiovascular Disease

## 2017-10-30 NOTE — Telephone Encounter (Signed)
New Message    Patient is calling about a request for labs to be done. He states that he just had labs done in November. But is also scheduled for labs in May. He does not feel that he needs to have additional labs done. Please call

## 2017-10-30 NOTE — Telephone Encounter (Addendum)
I spoke with pt. Lipid profile was last checked by primary care and follow up lab work planned.  Pt states he received letter  from our office to schedule appointment for lab work.  Chart reviewed and letter was to schedule one year follow up with Dr. Clifton JamesMcAlhany.  I scheduled pt to see Dr. Clifton JamesMcAlhany on 12/16/17 at 8:40.  Pt will discuss with Dr. Clifton JamesMcAlhany at this appointment if he needs yearly cardiology follow up or if he can follow up as needed.  Pt reports he is feeling well.

## 2017-12-16 ENCOUNTER — Encounter: Payer: Self-pay | Admitting: Cardiovascular Disease

## 2017-12-16 ENCOUNTER — Ambulatory Visit: Payer: Medicare Other | Admitting: Cardiovascular Disease

## 2017-12-16 VITALS — BP 114/60 | HR 48 | Ht 67.0 in | Wt 155.1 lb

## 2017-12-16 DIAGNOSIS — E785 Hyperlipidemia, unspecified: Secondary | ICD-10-CM | POA: Diagnosis not present

## 2017-12-16 DIAGNOSIS — I2510 Atherosclerotic heart disease of native coronary artery without angina pectoris: Secondary | ICD-10-CM

## 2017-12-16 MED ORDER — ROSUVASTATIN CALCIUM 5 MG PO TABS: 5.0000 mg | ORAL_TABLET | ORAL | 3 refills | Status: DC

## 2017-12-16 NOTE — Patient Instructions (Addendum)
Medication Instructions:  Your physician has recommended you make the following change in your medication:   START: crestor 5 mg three times a week    Labwork: Your physician recommends that you return for a FASTING lipid profile and liver function panel on 03/19/18   Testing/Procedures: None ordered  Follow-Up: Your physician wants you to follow-up in: 2 years with Dr. Clifton JamesMcAlhany. You will receive a reminder letter in the mail two months in advance. If you don't receive a letter, please call our office to schedule the follow-up appointment.   Any Other Special Instructions Will Be Listed Below (If Applicable).     If you need a refill on your cardiac medications before your next appointment, please call your pharmacy.

## 2017-12-16 NOTE — Progress Notes (Signed)
Chief Complaint  Patient presents with  . Coronary Artery Disease     History of Present Illness: 72 yo male with history of HLD, CAD, pericarditis here today for follow up. He was admitted to [facility] 10/30/15 with chest pain and acute ST changes. Emergent cardiac cath with mild disease mid RCA but no flow limiting lesions. Echo 10/31/15 with normal LV systolic function, moderate LVH, trivial AI. He was treated with an NSAID for possible acute pericarditis. He has been on daily ASA. He has stopped his statin due to muscle aches and fatigue.   He is here today for follow up. The patient denies any chest pain, dyspnea, palpitations, lower extremity edema, orthopnea, PND, dizziness, near syncope or syncope.    Primary Care Physician: Shirline Frees, NP   Past Medical History:  Diagnosis Date  . Acute pericarditis    a. admitted as a code STEMI but no sig CAD on cath.   . CAD (coronary artery disease)    30% stenosis mid RCA  . Colonic polyp    Dr. Kinnie Scales; hyperplastic polyp  . Gilbert syndrome   . Hyperlipidemia     Past Surgical History:  Procedure Laterality Date  . actinic keratoses     facial removed x 2, Dr Danella Deis  . basal cell cancer  2013 & 2015   Dr Campbell Stall  . CARDIAC CATHETERIZATION N/A 10/30/2015   Procedure: Left Heart Cath and Coronary Angiography;  Surgeon: Kathleene Hazel, MD;  Location: [facility] INVASIVE CV LAB;  Service: Cardiovascular;  Laterality: N/A;  . COLONOSCOPY  2014   negative  . COLONOSCOPY W/ POLYPECTOMY  2004   @ age 75, Dr Kinnie Scales. ? Hyperplastic polyp  . FLEXIBLE SIGMOIDOSCOPY  1999   negative  . KNEE ARTHROSCOPY  1984   right    Current Outpatient Medications  Medication Sig Dispense Refill  . Ascorbic Acid (VITAMIN C PO) Take 2,000 mg by mouth daily.     Marland Kitchen aspirin 81 MG tablet Take 81 mg by mouth daily.      Marland Kitchen b complex vitamins capsule Take 1 capsule daily by mouth.    . Cholecalciferol (VITAMIN D) 2000 UNITS CAPS Take 2,000 Units by mouth  daily.     . fish oil-omega-3 fatty acids 1000 MG capsule Take 1 g by mouth daily.     . Magnesium 250 MG TABS Take 250 mg by mouth daily.     . metroNIDAZOLE (METROCREAM) 0.75 % cream Apply 1 application topically daily.    . Multiple Vitamins-Minerals (MENS 50+ MULTI VITAMIN/MIN PO) Take 1 tablet daily by mouth.    . NON FORMULARY     . rosuvastatin (CRESTOR) 5 MG tablet Take 1 tablet (5 mg total) by mouth 3 (three) times a week. 30 tablet 3   No current facility-administered medications for this visit.     Allergies  Allergen Reactions  . Codeine Anaphylaxis    Mental status changes  . Crestor [Rosuvastatin Calcium] Other (See Comments)    Severe fatigue  . Pepcid [Famotidine] Shortness Of Breath  . Prilosec [Omeprazole] Shortness Of Breath  . Niacin Other (See Comments)    flushing    Social History   Socioeconomic History  . Marital status: Married    Spouse name: Not on file  . Number of children: Not on file  . Years of education: Not on file  . Highest education level: Not on file  Social Needs  . Financial resource strain: Not on file  .  Food insecurity - worry: Not on file  . Food insecurity - inability: Not on file  . Transportation needs - medical: Not on file  . Transportation needs - non-medical: Not on file  Occupational History  . Not on file  Tobacco Use  . Smoking status: Former Smoker    Packs/day: 1.00    Years: 13.00    Pack years: 13.00    Types: Cigarettes    Last attempt to quit: 10/29/1978    Years since quitting: 39.1  . Smokeless tobacco: Never Used  . Tobacco comment: smoked 1967-1980, up to 1 ppd  Substance and Sexual Activity  . Alcohol use: No    Alcohol/week: 0.0 oz  . Drug use: No  . Sexual activity: Not on file  Other Topics Concern  . Not on file  Social History Narrative   Originally from [place]. Moved to [place] in 1951. He moved to [place] in 1984. 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 Previously has traveled to [place], [place], [place], [place], [place], [place] and [place]. Currently has a cockatiel. No mold exposure. Has a hot tub but doesn't use it regularly.     Family History  Problem Relation Age of Onset  . Stroke Father [id number]  . Alcohol abuse Father   . Asthma Brother   . Breast cancer Mother   . HIV Brother   . Prostate cancer Brother 254  . Breast cancer Maternal Aunt   . Breast cancer Maternal Grandmother   . Heart disease Neg Hx   . Diabetes Neg Hx     Review of Systems:  As stated in the HPI and otherwise negative.   BP 114/60   Pulse (!) 48   Ht 5\' 7"  (1.702 m)   Wt 155 lb 1.9 oz (70.4 kg)   SpO2 99%   BMI 24.30 kg/m   Physical Examination:  General: Well developed, well nourished, NAD  HEENT: OP clear, mucus membranes moist  SKIN: warm, dry. No rashes. Neuro: No focal deficits  Musculoskeletal: Muscle strength 5/5 all ext  Psychiatric: Mood and affect normal  Neck: No JVD, no carotid bruits, no thyromegaly, no lymphadenopathy.  Lungs:Clear bilaterally, no wheezes, rhonci, crackles Cardiovascular: Regular rate and rhythm. No murmurs, gallops or rubs. Abdomen:Soft. Bowel sounds present. Non-tender.  Extremities: No lower extremity edema. Pulses are 2 + in the bilateral DP/PT.  Cardiac cath 10/30/15: Coronary Findings    Dominance: Right   Left Anterior Descending  . Vessel is large. Vessel is angiographically normal.   . First Diagonal Branch   The vessel is moderate in size and is angiographically normal.   . Second Diagonal Branch   The vessel is small in size.     Left Circumflex  . Vessel is moderate in size. Vessel is angiographically normal.   . First Obtuse Marginal Branch   The vessel is moderate in size and is angiographically normal.     Right Coronary Artery  . Vessel is large.   . Mid RCA lesion, 30% stenosed. Discrete.   . Right Posterior Descending Artery   The vessel is moderate in size.        Wall Motion                 Coronary Diagrams    Diagnostic Diagram         Echo 10/31/15: Left ventricle: The cavity size was normal. Wall thickness was increased in a pattern of moderate LVH.  Systolic function was normal. The estimated ejection fraction was in the range of 60% to 65%. GLPSS -19% - normal. Wall motion was normal; there were no regional wall motion abnormalities. Doppler parameters are consistent with abnormal left ventricular relaxation (grade 1 diastolic dysfunction). The E/e&' ratio is <8, suggesting normal LV filling pressure. - Aortic valve: Trileaflet. Sclerosis without stenosis. There was trivial regurgitation. - Mitral valve: Mildly thickened leaflets . - Left atrium: The atrium was normal in size. - Right atrium: The atrium was mildly dilated. - Inferior vena cava: The vessel was normal in size. The respirophasic diameter changes were in the normal range (>= 50%), consistent with normal central venous pressure.  Impressions:  - LVEF 60-65%, moderate LVH, normal wall motion, normal GLPSS, diastolic dysfunction by TDI with normal LV filling pressure, trivial AI, mild RAE.  EKG:  EKG is ordered today. The ekg ordered today demonstrates Sinus brady, 1st degree AV block.   Recent Labs: 09/06/2017: ALT 21; BUN 26; Creatinine, Ser 1.00; Hemoglobin 13.5; Magnesium 2.1; Platelets 264.0; Potassium 4.2; Sodium 137   Lipid Panel    Component Value Date/Time   CHOL 211 (H) 09/06/2017 0855   CHOL 149 11/23/2016 0756   TRIG 68.0 09/06/2017 0855   HDL 47.70 09/06/2017 0855   HDL 54 11/23/2016 0756   CHOLHDL 4 09/06/2017 0855   VLDL 13.6 09/06/2017 0855   LDLCALC 150 (H) 09/06/2017 0855   LDLCALC 85 11/23/2016 0756   LDLDIRECT 159.2 08/31/2013 0946     Wt Readings from Last 3 Encounters:  12/16/17 155 lb 1.9 oz (70.4 kg)  09/16/17 156 lb (70.8 kg)  09/11/17 158 lb 2 oz (71.7 kg)     Other studies Reviewed: Additional  studies/ records that were reviewed today include: . Review of the above records demonstrates:   Assessment and Plan:   1. CAD without angina: He has mild disease in the mid RCA. No chest pain. Will continue ASA. He is willing to try Crestor 5 mg three days per week. Repeat lipids and LFTs in 12 weeks. If LDL not at goal, will consider Repatha/Praluent.    2. Hyperlipidemia: Restart statin. If he does not tolerate, will need to consider Repatha/praluent. He will call back to let us know. See above.   Current medicines are reviewed at length with the patient today.  The patient does not have concerns regarding medicines.  The following changes have been made:  no change  Labs/ tests ordered today include:   Orders Placed This Encounter  Procedures  . Hepatic function panel  . Lipid panel  . EKG 12-Lead    Disposition:   FU with me  in 24  months  Signed, Verne Carrow, MD 12/16/2017 9:38 AM    [facility] [address] [place], [place], [place]  [address] Phone: [phone]; Fax: [phone]

## 2018-02-17 ENCOUNTER — Encounter: Payer: Self-pay | Admitting: Family Medicine

## 2018-02-17 ENCOUNTER — Ambulatory Visit: Payer: Medicare Other | Admitting: Family Medicine

## 2018-02-17 DIAGNOSIS — M79605 Pain in left leg: Secondary | ICD-10-CM | POA: Diagnosis not present

## 2018-02-17 MED ORDER — PREDNISONE 10 MG PO TABS: ORAL_TABLET | ORAL | 0 refills | Status: DC

## 2018-02-17 NOTE — Patient Instructions (Signed)
You have piriformis syndrome with sciatica, less likely lumbar radiculopathy (irritated nerve from your back). Pick 2-3 stretches where you feel the pull in the area of pain - do 3 of these and hold for 20-30 seconds twice a day. Standing hip rotation, hip side raise 3 sets of 10 once a day. Add ankle weight if these become too easy. A prednisone dose pack is the best option for immediate relief and may be prescribed. Day after finishing prednisone start Aleve 2 tabs twice a day with food for pain and inflammation. Consider physical therapy. Call me in a week to let me know how you're doing.

## 2018-02-17 NOTE — Progress Notes (Signed)
PCP: Shirline Frees, NP  Subjective:   HPI: Patient is a 72 y.o. Trevor [name] here for left leg pain.  Patient reports on Tuesday he did a long bike ride and felt great. Woke up Wednesday with severe pain from left glute into left calf. Associated spasms in left calf. Has tried icing, heat, epsom salt bath, ibuprofen. Worse to sit. Cannot get comfortable at bedtime. Pain level 8/10 and sharp. No bowel/bladder dysfunction. No numbness.   Past Medical History:  Diagnosis Date  . Acute pericarditis    a. admitted as a code STEMI but no sig CAD on cath.   . CAD (coronary artery disease)    30% stenosis mid RCA  . Colonic polyp    Dr. Kinnie Scales; hyperplastic polyp  . Gilbert syndrome   . Hyperlipidemia     Current Outpatient Medications on File Prior to Visit  Medication Sig Dispense Refill  . Ascorbic Acid (VITAMIN C PO) Take 2,000 mg by mouth daily.     Marland Kitchen aspirin 81 MG tablet Take 81 mg by mouth daily.      Marland Kitchen b complex vitamins capsule Take 1 capsule daily by mouth.    . Cholecalciferol (VITAMIN D) 2000 UNITS CAPS Take 2,000 Units by mouth daily.     . fish oil-omega-3 fatty acids 1000 MG capsule Take 1 g by mouth daily.     . Magnesium 250 MG TABS Take 250 mg by mouth daily.     . metroNIDAZOLE (METROCREAM) 0.Trevor % cream Apply 1 application topically daily.    . Multiple Vitamins-Minerals (MENS 50+ MULTI VITAMIN/MIN PO) Take 1 tablet daily by mouth.    . NON FORMULARY     . rosuvastatin (CRESTOR) 5 MG tablet Take 1 tablet (5 mg total) by mouth 3 (three) times a week. 30 tablet 3   No current facility-administered medications on file prior to visit.     Past Surgical History:  Procedure Laterality Date  . actinic keratoses     facial removed x 2, Dr Danella Deis  . basal cell cancer  2013 & 2015   Dr Campbell Stall  . CARDIAC CATHETERIZATION N/A 10/30/2015   Procedure: Left Heart Cath and Coronary Angiography;  Surgeon: Kathleene Hazel, MD;  Location: [facility] INVASIVE CV LAB;  Service:  Cardiovascular;  Laterality: N/A;  . COLONOSCOPY  2014   negative  . COLONOSCOPY W/ POLYPECTOMY  2004   @ age 27, Dr Kinnie Scales. ? Hyperplastic polyp  . FLEXIBLE SIGMOIDOSCOPY  1999   negative  . KNEE ARTHROSCOPY  1984   right    Allergies  Allergen Reactions  . Codeine Anaphylaxis    Mental status changes  . Crestor [Rosuvastatin Calcium] Other (See Comments)    Severe fatigue  . Pepcid [Famotidine] Shortness Of Breath  . Prilosec [Omeprazole] Shortness Of Breath  . Niacin Other (See Comments)    flushing    Social History   Socioeconomic History  . Marital status: Married    Spouse name: Not on file  . Number of children: Not on file  . Years of education: Not on file  . Highest education level: Not on file  Occupational History  . Not on file  Social Needs  . Financial resource strain: Not on file  . Food insecurity:    Worry: Not on file    Inability: Not on file  . Transportation needs:    Medical: Not on file    Non-medical: Not on file  Tobacco Use  . Smoking status:  Former Smoker    Packs/day: 1.00    Years: 13.00    Pack years: 13.00    Types: Cigarettes    Last attempt to quit: 10/29/1978    Years since quitting: 39.3  . Smokeless tobacco: Never Used  . Tobacco comment: smoked 1967-1980, up to 1 ppd  Substance and Sexual Activity  . Alcohol use: No    Alcohol/week: 0.0 oz  . Drug use: No  . Sexual activity: Not on file  Lifestyle  . Physical activity:    Days per week: Not on file    Minutes per session: Not on file  . Stress: Not on file  Relationships  . Social connections:    Talks on phone: Not on file    Gets together: Not on file    Attends religious service: Not on file    Active member of club or organization: Not on file    Attends meetings of clubs or organizations: Not on file    Relationship status: Not on file  . Intimate partner violence:    Fear of current or ex partner: Not on file    Emotionally abused: Not on file     Physically abused: Not on file    Forced sexual activity: Not on file  Other Topics Concern  . Not on file  Social History Narrative   Originally from [place]. Moved to [place] in 1951. He moved to [place] in 1984. 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 Previously has traveled to [place], [place], [place], [place], [place], [place], and [place]. Currently has a cockatiel. No mold exposure. Has a hot tub but doesn't use it regularly.     Family History  Problem Relation Age of Onset  . Stroke Father 561  . Alcohol abuse Father   . Asthma Brother   . Breast cancer Mother   . HIV Brother   . Prostate cancer Brother [phone]  . Breast cancer Maternal Aunt   . Breast cancer Maternal Grandmother   . Heart disease Neg Hx   . Diabetes Neg Hx     BP 134/63   Pulse (!) 57   Ht 5\' 7"  (1.702 m)   Wt 154 lb (69.9 kg)   BMI 24.12 kg/m   Review of Systems: See HPI above.     Objective:  Physical Exam:  Gen: NAD, comfortable in exam room  Back: No gross deformity, scoliosis. TTP minimally left hip external rotators.  no other tenderness.  No midline or bony TTP. FROM. Strength LEs 5/5 all muscle groups.   2+ MSRs in patellar and trace achilles tendons, equal bilaterally. Positive SLR left, negative right. Sensation intact to light touch bilaterally.  Left hip/leg: No deformity, swelling, bruising, redness, palpable cords. FROM with 5/5 strength. TTP lateral gastroc.  No other tenderness. NVI distally. Negative logroll. Negative fabers and piriformis.   Assessment & Plan:  1. Left leg pain - history would suggest piriformis syndrome with sciatica though he does have a positive straight leg raise - possible though less likely given the location that he has lumbar radiculopathy.  Shown home exercises and stretches to do daily.  Start prednisone dose pack then transition to aleve.  Consider physical therapy.  Call us in a week for an update on his  status.

## 2018-02-17 NOTE — Assessment & Plan Note (Signed)
history would suggest piriformis syndrome with sciatica though he does have a positive straight leg raise - possible though less likely given the location that he has lumbar radiculopathy.  Shown home exercises and stretches to do daily.  Start prednisone dose pack then transition to aleve.  Consider physical therapy.  Call us in a week for an update on his status.

## 2018-02-24 ENCOUNTER — Other Ambulatory Visit: Payer: Self-pay | Admitting: *Deleted

## 2018-02-24 DIAGNOSIS — M5440 Lumbago with sciatica, unspecified side: Secondary | ICD-10-CM

## 2018-02-25 ENCOUNTER — Encounter: Payer: Self-pay | Admitting: Sports Medicine

## 2018-02-25 ENCOUNTER — Ambulatory Visit: Payer: Medicare Other | Admitting: Sports Medicine

## 2018-02-25 ENCOUNTER — Ambulatory Visit
Admission: RE | Admit: 2018-02-25 | Discharge: 2018-02-25 | Disposition: A | Discharge status: Home / Self Care | Payer: Medicare Other | Source: Ambulatory Visit | Attending: Sports Medicine | Admitting: Sports Medicine

## 2018-02-25 VITALS — BP 130/60 | Ht 67.0 in | Wt 154.0 lb

## 2018-02-25 DIAGNOSIS — M5440 Lumbago with sciatica, unspecified side: Secondary | ICD-10-CM | POA: Diagnosis not present

## 2018-02-25 NOTE — Progress Notes (Signed)
   Subjective:    Patient ID: Trevor [name], male    DOB: 1946-08-29, 72 y.o.   MRN: [id number]  HPI   Trevor [name] presents today with persistent left leg pain. He was seen in the office last week and diagnosed with possible piriformis syndrome. He was given some piriformis exercises and placed on prednisone but his pain persists. His pain is primarily in the left buttock but he does have numbness in the bottom of the left foot. He was initially getting severe pain with sitting but that has improved somewhat. He was also getting pain in the left calf but that has resolved. He has not noticed any weakness. All of his symptoms began after an aggressive bike ride. He's had a history of piriformis problems in the past but they have always responded to massage therapy and he has never had any pain past his knee. He's currently taking 200-400 mg of ibuprofen as needed and this does seem to help. He has been unable to return to exercise.    Review of Systems As above    Objective:   Physical Exam  Well-developed, well-nourished. No acute distress. Awake alert and oriented 3. Vital signs reviewed  Lumbar spine: No tenderness along the lumbar midline. No spasm. He is tender to palpation at the piriformis as well as over the ischial tuberosity.  Neurological exam: 4+/5 strength with resisted great toe extension on the left compared to 5/5 on the right. He has 5/5 strength with resisted plantar flexion but does have trouble ambulating on tiptoe which she attributes to pain more than weakness. No noticeable atrophy. Decreased sensation on the bottom of the foot. Good pulses.  X-rays of his lumbar spine show mild multilevel degenerative changes but nothing severe. Nothing acute.      Assessment & Plan:   Left leg pain likely secondary to lumbar radiculopathy  Patient will discontinue his piriformis exercises and will start McKenzie extension exercises daily. He may continue with his ibuprofen as  needed. He will take it with food. He is cautioned about GI upset with this. He will call me in 3 days with an update and he will follow-up with me in the office in one week. If symptoms do not continue to improve, then I would consider MRI of the lumbar spine to rule out lumbar disc herniation.

## 2018-02-26 ENCOUNTER — Encounter: Payer: Self-pay | Admitting: Sports Medicine

## 2018-02-26 ENCOUNTER — Other Ambulatory Visit: Payer: Self-pay | Admitting: *Deleted

## 2018-02-26 DIAGNOSIS — M5440 Lumbago with sciatica, unspecified side: Secondary | ICD-10-CM

## 2018-02-28 ENCOUNTER — Ambulatory Visit
Admission: RE | Admit: 2018-02-28 | Discharge: 2018-02-28 | Disposition: A | Discharge status: Home / Self Care | Payer: Medicare Other | Source: Ambulatory Visit | Attending: Sports Medicine | Admitting: Sports Medicine

## 2018-02-28 ENCOUNTER — Encounter: Payer: Self-pay | Admitting: Sports Medicine

## 2018-02-28 ENCOUNTER — Other Ambulatory Visit: Payer: Self-pay | Admitting: *Deleted

## 2018-02-28 ENCOUNTER — Other Ambulatory Visit: Payer: Self-pay | Admitting: Sports Medicine

## 2018-02-28 DIAGNOSIS — M5440 Lumbago with sciatica, unspecified side: Secondary | ICD-10-CM

## 2018-02-28 MED ORDER — DIAZEPAM 10 MG PO TABS: ORAL_TABLET | ORAL | 0 refills | Status: DC

## 2018-02-28 MED ORDER — TRAMADOL HCL 50 MG PO TABS: 50.0000 mg | ORAL_TABLET | Freq: Two times a day (BID) | ORAL | 0 refills | Status: DC

## 2018-02-28 MED ORDER — GABAPENTIN 300 MG PO CAPS: 300.0000 mg | ORAL_CAPSULE | Freq: Every day | ORAL | 0 refills | Status: DC

## 2018-03-03 ENCOUNTER — Other Ambulatory Visit: Payer: Self-pay | Admitting: *Deleted

## 2018-03-03 DIAGNOSIS — M5440 Lumbago with sciatica, unspecified side: Secondary | ICD-10-CM

## 2018-03-04 ENCOUNTER — Ambulatory Visit
Admission: RE | Admit: 2018-03-04 | Discharge: 2018-03-04 | Disposition: A | Discharge status: Home / Self Care | Payer: Medicare Other | Source: Ambulatory Visit | Attending: Sports Medicine | Admitting: Sports Medicine

## 2018-03-04 DIAGNOSIS — M5440 Lumbago with sciatica, unspecified side: Secondary | ICD-10-CM

## 2018-03-05 ENCOUNTER — Ambulatory Visit
Admission: RE | Admit: 2018-03-05 | Discharge: 2018-03-05 | Disposition: A | Discharge status: Home / Self Care | Payer: Medicare Other | Source: Ambulatory Visit | Attending: Sports Medicine | Admitting: Sports Medicine

## 2018-03-05 ENCOUNTER — Encounter: Payer: Self-pay | Admitting: Sports Medicine

## 2018-03-07 ENCOUNTER — Ambulatory Visit (INDEPENDENT_AMBULATORY_CARE_PROVIDER_SITE_OTHER): Payer: Medicare Other | Admitting: Sports Medicine

## 2018-03-07 VITALS — BP 120/72 | Ht 67.0 in | Wt 153.0 lb

## 2018-03-07 DIAGNOSIS — M5126 Other intervertebral disc displacement, lumbar region: Secondary | ICD-10-CM

## 2018-03-07 MED ORDER — IBUPROFEN 800 MG PO TABS: ORAL_TABLET | ORAL | 0 refills | Status: DC

## 2018-03-07 NOTE — Patient Instructions (Signed)
[facility] Dr Yevette Edwards Friday May 17th at 2p Arrival time 1:15p [address] [place] [phone]   Follow-up with Dr Margaretha Sheffield in 3 weeks

## 2018-03-07 NOTE — Progress Notes (Signed)
   Subjective:    Patient ID: Trevor [name], male    DOB: 09/19/1946, 72 y.o.   MRN: [id number]  HPI    [name] comes in today to discuss MRI findings of his lumbar spine. We ordered the MRI after he continued to have pain in his left hip and leg. The MRI shows a large left-sided L5-S1 disc herniation with compression of the S1 nerve root. The remainder of his lumbar spine appears fairly healthy other than some mild degenerative changes. Patient continues to endorse pain in the left buttock. Occasional pain in the left calf. Pain is worse at night and first thing in the morning. Improves a little bit during the day. The bottom of his left foot is also "tingling". He has not noticed any weakness. He has tried prednisone, tramadol, and Neurontin. Tramadol helps some. He did not notice much benefit from prednisone or Neurontin.    Review of Systems As above    Objective:   Physical Exam  Well-developed, well-nourished. No acute distress. Awake alert and oriented 3. Vital signs reviewed  Neurological exam: Patient has an absent Achilles reflex on the left compared to 2/4 on the right. Slight weakness with resisted plantar flexion on the left but he is able to ambulate on his tiptoes easier than on his previous visit. No noticeable atrophy. Good pulses.  MRI is as above      Assessment & Plan:   Left-sided L5-S1 disc herniation  Patient has done well with ibuprofen in the past for other aches and pains. We will try 800 milligrams of ibuprofen twice daily with food and he may continue with his tramadol as needed for pain. He will start physical therapy with Ellamae Sia but I would also like for him to meet with Dr. Yevette Edwards to discuss possible surgical options. If a simple discectomy will be beneficial, then I would encourage the patient to consider this. I will set up a tentative follow-up appointment with me in 3 weeks but that may change based on his visit with Dr. Yevette Edwards.

## 2018-03-12 ENCOUNTER — Encounter: Payer: Self-pay | Admitting: Sports Medicine

## 2018-03-19 ENCOUNTER — Other Ambulatory Visit: Payer: Medicare Other

## 2018-03-20 ENCOUNTER — Encounter: Payer: Self-pay | Admitting: Sports Medicine

## 2018-03-25 ENCOUNTER — Telehealth: Payer: Self-pay | Admitting: Cardiovascular Disease

## 2018-03-25 NOTE — Telephone Encounter (Signed)
Spoke with patient and assured him that the labs that he is having drawn tomorrow will not be affected by the ibuprofen which he is taking.  He verbalized understanding.

## 2018-03-25 NOTE — Telephone Encounter (Signed)
New Message   Pt c/o medication issue:  1. Name of Medication: Ibuprofren  2. How are you currently taking this medication (dosage and times per day)?  twice a day   3. Are you having a reaction (difficulty breathing--STAT)?   4. What is your medication issue? Patient states that he is to have lab work tomorrow and he wants to be sure that if he is taking ibuprofen that it will not alter his lab results. Please call.

## 2018-03-26 ENCOUNTER — Other Ambulatory Visit: Payer: Medicare Other | Admitting: *Deleted

## 2018-03-26 DIAGNOSIS — E785 Hyperlipidemia, unspecified: Secondary | ICD-10-CM

## 2018-03-26 DIAGNOSIS — I2510 Atherosclerotic heart disease of native coronary artery without angina pectoris: Secondary | ICD-10-CM

## 2018-03-26 LAB — HEPATIC FUNCTION PANEL
ALT: 24 IU/L (ref 0–44)
AST: 38 IU/L (ref 0–40)
Albumin: 4.2 g/dL (ref 3.5–4.8)
Alkaline Phosphatase: 47 IU/L (ref 39–117)
BILIRUBIN, DIRECT: 0.28 mg/dL (ref 0.00–0.40)
Bilirubin Total: 1.3 mg/dL — ABNORMAL HIGH (ref 0.0–1.2)
Total Protein: 6.2 g/dL (ref 6.0–8.5)

## 2018-03-26 LAB — LIPID PANEL
CHOLESTEROL TOTAL: 164 mg/dL (ref 100–199)
Chol/HDL Ratio: 3.6 ratio (ref 0.0–5.0)
HDL: 45 mg/dL (ref >39)
LDL Calculated: 101 mg/dL — ABNORMAL HIGH (ref 0–99)
TRIGLYCERIDES: 88 mg/dL (ref 0–149)
VLDL CHOLESTEROL CAL: 18 mg/dL (ref 5–40)

## 2018-03-27 ENCOUNTER — Encounter: Payer: Self-pay | Admitting: Sports Medicine

## 2018-03-27 ENCOUNTER — Ambulatory Visit: Payer: Medicare Other | Admitting: Sports Medicine

## 2018-03-27 VITALS — BP 138/60 | Ht 67.0 in | Wt 153.0 lb

## 2018-03-27 DIAGNOSIS — M5126 Other intervertebral disc displacement, lumbar region: Secondary | ICD-10-CM

## 2018-03-27 NOTE — Progress Notes (Signed)
   Subjective:    Patient ID: Trevor [name], male    DOB: [birth year], 72 y.o.   MRN: [id number]  HPI   Trevor [name] comes in today for follow-up on left leg sciatica. Recent MRI showed a large disc herniation at L5-S1. He has noticed significant improvement in his pain with physical therapy (working with Ellamae Sia) and scheduled ibuprofen. He is currently taking 800 mg twice daily. He does endorse some mild weakness but has returned to doing some very light running. Also experiencing some numbness on the bottom of his left foot. He canceled his appointment with Dr.Dumonski when he started feeling better.   Review of Systems As above    Objective:   Physical Exam  Well-developed, well-nourished. No acute distress. Awake alert and oriented 3. Vital signs reviewed  Neurological exam: Patient does still have some weakness when ambulating on his tiptoes on the left. Otherwise he has good strength including against resisted plantar flexion while seated. Trace Achilles reflex on the left and 1+ on the right. No noticeable calf atrophy. Both calves measure 37 cm in circumference.      Assessment & Plan:   L5-S1 disc herniation  Patient seems to be improving with formal physical therapy. Continue for the time being and follow-up with me in 3 weeks. He will start to wean off of his ibuprofen. If his pain starts to return or he develops worsening weakness then he will need to reschedule with Dr.Dumonski .I think he is okay to continue with some very light running (walk/run program) as long as his symptoms are continuing to improve. He is encouraged to call me with questions or concerns prior to his follow-up visit in 3 weeks.

## 2018-03-28 ENCOUNTER — Encounter: Payer: Self-pay | Admitting: Sports Medicine

## 2018-04-17 ENCOUNTER — Ambulatory Visit: Payer: Medicare Other | Admitting: Sports Medicine

## 2018-04-17 VITALS — BP 134/63

## 2018-04-17 DIAGNOSIS — M5126 Other intervertebral disc displacement, lumbar region: Secondary | ICD-10-CM

## 2018-04-17 NOTE — Progress Notes (Signed)
   Subjective:    Patient ID: Trevor [name], male    DOB: 10/12/1946, 72 y.o.   MRN: [id number]  HPI    Review of Systems     Objective:   Physical Exam        Assessment & Plan:

## 2018-04-17 NOTE — Progress Notes (Signed)
Subjective       Objective             Assessment/Plan

## 2018-04-17 NOTE — Progress Notes (Signed)
   Subjective:    Patient ID: Trevor [name], male    DOB: 06/15/1946, 72 y.o.   MRN: [id number]  HPI  "Trevor [name]" is a 72 year old man who presents for follow up for his large dis herniation at L5-S1 (confirmed via MRI). He has been continuing to work with Jonny RuizJohn O'Halloran 2x per week and doing physical therapy exercises at home. He reports his pain has been improving, he has been off of ibuprofen for the past 10 days. He still has some residual pain in his left calf that is worse in the morning when he wakes up and improves slightly with a hot shower and physical activity. His range of motion of his left leg has improved, however he continues to have weakness in his left lower leg and cannot walk on his left toes. He also has some numbness in his left toes. He was able to run/walk a 5k at a slower pace than his baseline and reports that he heel strikes because of his weakness and he is worried about altering his gait and throwing something else out of place. He is wondering what the next steps are. Also of note, he reports starting tumeric and has had improvement in his knee arthritis.   Review of Systems  Musculoskeletal: Positive for gait problem. Negative for back pain.  Neurological: Positive for weakness (left toes) and numbness (left toes).        Objective:   Physical Exam  Constitutional: He appears well-developed and well-nourished. No distress.  HENT:  Head: Atraumatic.  Eyes: Conjunctivae are normal. Right eye exhibits no discharge. Left eye exhibits no discharge.  Neck: Neck supple.  Cardiovascular:  Extremities warm and well perfused, intact pedal pulses  Pulmonary/Chest: Effort normal. No respiratory distress.  Musculoskeletal: Normal range of motion. He exhibits tenderness (left calf). He exhibits no edema or deformity.  Neurological: He is alert. He displays abnormal reflex (absent achilles reflex on left, normal +1 right achilles reflex. Patellar reflex +2 bilaterally ).  He exhibits normal muscle tone.  Skin: Skin is warm and dry. He is not diaphoretic.  Psychiatric: He has a normal mood and affect.          Assessment & Plan:   Trevor [name] is a [id number] man with a herniated disc at L5-S1. His symptoms are improving with physical therapy, however he continues to exhibit weakness in his left lower leg and toes. We recommend he see orthopedic surgery about their recommendations for management as far as how long he can continue to wait and medically manage with PT v. Surgical options. In the meantime, continue physical therapy and run as tolerated. Follow up with Dr. Margaretha Sheffieldraper after he sees orthopedics to discuss further management.  Jeral FruitNikki Trevor Henandez, [facility] PGY1  Patient seen and evaluated with the resident. I agree with the above plan of care. Although Trevor [name]'s pain is much less, he still has significant weakness with plantar flexion. Significant weakness with walking on his tiptoes on the left. At this point I recommended that he go ahead and contact Dr. Yevette Edwardsumonski and reschedule his appointment. I want him to continue with physical therapy in the meantime and follow-up with me either via telephone, email or office visit after his appointment with Dr.Dumonski.

## 2018-04-25 ENCOUNTER — Encounter: Payer: Self-pay | Admitting: Sports Medicine

## 2018-05-05 ENCOUNTER — Other Ambulatory Visit: Payer: Self-pay | Admitting: Orthopedic Surgery

## 2018-05-05 DIAGNOSIS — M5416 Radiculopathy, lumbar region: Secondary | ICD-10-CM

## 2018-05-08 ENCOUNTER — Ambulatory Visit
Admission: RE | Admit: 2018-05-08 | Discharge: 2018-05-08 | Disposition: A | Discharge status: Home / Self Care | Payer: Medicare Other | Source: Ambulatory Visit | Attending: Orthopedic Surgery | Admitting: Orthopedic Surgery

## 2018-05-08 DIAGNOSIS — M5416 Radiculopathy, lumbar region: Secondary | ICD-10-CM

## 2018-05-08 MED ORDER — IOPAMIDOL (ISOVUE-M 200) INJECTION 41%
1.0000 mL | Freq: Once | INTRAMUSCULAR | Status: AC
  Administered 2018-05-08: 1 mL via EPIDURAL

## 2018-05-08 MED ORDER — METHYLPREDNISOLONE ACETATE 40 MG/ML INJ SUSP (RADIOLOG
120.0000 mg | Freq: Once | INTRAMUSCULAR | Status: AC
  Administered 2018-05-08: 120 mg via EPIDURAL

## 2018-05-08 NOTE — Discharge Instructions (Signed)

## 2018-05-12 ENCOUNTER — Encounter: Payer: Self-pay | Admitting: Sports Medicine

## 2018-07-08 ENCOUNTER — Other Ambulatory Visit: Payer: Self-pay | Admitting: Orthopedic Surgery

## 2018-07-08 DIAGNOSIS — M5416 Radiculopathy, lumbar region: Secondary | ICD-10-CM

## 2018-07-18 ENCOUNTER — Ambulatory Visit
Admission: RE | Admit: 2018-07-18 | Discharge: 2018-07-18 | Disposition: A | Discharge status: Home / Self Care | Payer: Medicare Other | Source: Ambulatory Visit | Attending: Orthopedic Surgery | Admitting: Orthopedic Surgery

## 2018-07-18 DIAGNOSIS — M5416 Radiculopathy, lumbar region: Secondary | ICD-10-CM

## 2018-07-18 MED ORDER — IOPAMIDOL (ISOVUE-M 200) INJECTION 41%
1.0000 mL | Freq: Once | INTRAMUSCULAR | Status: AC
  Administered 2018-07-18: 1 mL via EPIDURAL

## 2018-07-18 MED ORDER — METHYLPREDNISOLONE ACETATE 40 MG/ML INJ SUSP (RADIOLOG
120.0000 mg | Freq: Once | INTRAMUSCULAR | Status: AC
  Administered 2018-07-18: 120 mg via EPIDURAL

## 2018-07-18 NOTE — Discharge Instructions (Signed)

## 2018-07-22 ENCOUNTER — Other Ambulatory Visit: Payer: Medicare Other

## 2018-08-07 ENCOUNTER — Other Ambulatory Visit: Payer: Self-pay | Admitting: Cardiovascular Disease

## 2018-08-07 MED ORDER — ROSUVASTATIN CALCIUM 5 MG PO TABS: 5.0000 mg | ORAL_TABLET | ORAL | 4 refills | Status: DC

## 2018-09-09 ENCOUNTER — Ambulatory Visit (INDEPENDENT_AMBULATORY_CARE_PROVIDER_SITE_OTHER): Payer: Medicare Other | Admitting: Adult Health

## 2018-09-09 ENCOUNTER — Encounter: Payer: Self-pay | Admitting: Adult Health

## 2018-09-09 VITALS — BP 100/42 | HR 64 | Temp 97.9°F | Ht 67.0 in | Wt 157.6 lb

## 2018-09-09 DIAGNOSIS — N400 Enlarged prostate without lower urinary tract symptoms: Secondary | ICD-10-CM

## 2018-09-09 DIAGNOSIS — I2510 Atherosclerotic heart disease of native coronary artery without angina pectoris: Secondary | ICD-10-CM

## 2018-09-09 DIAGNOSIS — E785 Hyperlipidemia, unspecified: Secondary | ICD-10-CM | POA: Diagnosis not present

## 2018-09-09 DIAGNOSIS — Z0000 Encounter for general adult medical examination without abnormal findings: Secondary | ICD-10-CM

## 2018-09-09 LAB — COMPREHENSIVE METABOLIC PANEL
ALT: 20 U/L (ref 0–53)
AST: 32 U/L (ref 0–37)
Albumin: 4.2 g/dL (ref 3.5–5.2)
Alkaline Phosphatase: 50 U/L (ref 39–117)
BUN: 24 mg/dL — AB (ref 6–23)
CHLORIDE: 102 meq/L (ref 96–112)
CO2: 28 meq/L (ref 19–32)
CREATININE: 0.94 mg/dL (ref 0.40–1.50)
Calcium: 8.9 mg/dL (ref 8.4–10.5)
GFR: 84.17 mL/min (ref >60.00)
Glucose, Bld: 89 mg/dL (ref 70–99)
POTASSIUM: 4.8 meq/L (ref 3.5–5.1)
Sodium: 137 mEq/L (ref 135–145)
Total Bilirubin: 1.8 mg/dL — ABNORMAL HIGH (ref 0.2–1.2)
Total Protein: 6.3 g/dL (ref 6.0–8.3)

## 2018-09-09 LAB — CBC WITH DIFFERENTIAL/PLATELET
BASOS PCT: 0.9 % (ref 0.0–3.0)
Basophils Absolute: 0.1 10*3/uL (ref 0.0–0.1)
EOS ABS: 0.1 10*3/uL (ref 0.0–0.7)
Eosinophils Relative: 2.4 % (ref 0.0–5.0)
HCT: 38.4 % — ABNORMAL LOW (ref 39.0–52.0)
HEMOGLOBIN: 13.2 g/dL (ref 13.0–17.0)
Lymphocytes Relative: 34.6 % (ref 12.0–46.0)
Lymphs Abs: 1.9 10*3/uL (ref 0.7–4.0)
MCHC: 34.4 g/dL (ref 30.0–36.0)
MCV: 92.5 fl (ref 78.0–100.0)
Monocytes Absolute: 0.7 10*3/uL (ref 0.1–1.0)
Monocytes Relative: 12.8 % — ABNORMAL HIGH (ref 3.0–12.0)
NEUTROS ABS: 2.7 10*3/uL (ref 1.4–7.7)
NEUTROS PCT: 49.3 % (ref 43.0–77.0)
PLATELETS: 243 10*3/uL (ref 150.0–400.0)
RBC: 4.16 Mil/uL — ABNORMAL LOW (ref 4.22–5.81)
RDW: 13.5 % (ref 11.5–15.5)
WBC: 5.5 10*3/uL (ref 4.0–10.5)

## 2018-09-09 LAB — LIPID PANEL
CHOL/HDL RATIO: 3
Cholesterol: 156 mg/dL (ref 0–200)
HDL: 47.2 mg/dL (ref >39.00)
LDL Cholesterol: 93 mg/dL (ref 0–99)
NONHDL: 108.72
Triglycerides: 77 mg/dL (ref 0.0–149.0)
VLDL: 15.4 mg/dL (ref 0.0–40.0)

## 2018-09-09 LAB — PSA: PSA: 2.9 ng/mL (ref 0.10–4.00)

## 2018-09-09 NOTE — Progress Notes (Signed)
Subjective:    Patient ID: Trevor [name], male    DOB: 1946/07/30, 72 y.o.   MRN: [id number]  HPI Patient presents for yearly preventative medicine examination. He is a pleasant and healthy 72 year old male who  has a past medical history of Acute pericarditis, CAD (coronary artery disease), Colonic polyp, Gilbert syndrome, and Hyperlipidemia.  CAD/Hyperlipidemia - Is followed by Cardiology, Currently prescribed Crestor 5 mg three times a week and daily ASA. He is tolerating this well.  Lab Results  Component Value Date   CHOL 164 03/26/2018   HDL 45 03/26/2018   LDLCALC 101 (H) 03/26/2018   LDLDIRECT 159.2 08/31/2013   TRIG 88 03/26/2018   CHOLHDL 3.6 03/26/2018    All immunizations and health maintenance protocols were reviewed with the patient and needed orders were placed.  Appropriate screening laboratory values were ordered for the patient including screening of hyperlipidemia, renal function and hepatic function. If indicated by BPH, a PSA was ordered.  Medication reconciliation,  past medical history, social history, problem list and allergies were reviewed in detail with the patient  Goals were established with regard to weight loss, exercise, and  diet in compliance with medications. He actively exercises multiple times per week and is eating a heart healthy diet.   End of life planning was discussed. He has an advanced directive and living will   He is up to date on routine dental and vision screens as well as screening colonoscopy.   Review of Systems  Constitutional: Negative.   HENT: Negative.   Eyes: Negative.   Respiratory: Negative.   Cardiovascular: Negative.   Gastrointestinal: Negative.   Endocrine: Negative.   Genitourinary: Negative.   Musculoskeletal: Negative.   Skin: Negative.   Allergic/Immunologic: Negative.   Neurological: Negative.   Hematological: Negative.   Psychiatric/Behavioral: Negative.   All other systems reviewed and are  negative.      Objective:   Physical Exam  Constitutional: He is oriented to person, place, and time. He appears well-developed and well-nourished. No distress.  HENT:  Head: Normocephalic and atraumatic.  Right Ear: External ear normal.  Left Ear: External ear normal.  Nose: Nose normal.  Mouth/Throat: Oropharynx is clear and moist. No oropharyngeal exudate.  Eyes: Pupils are equal, round, and reactive to light. Conjunctivae and EOM are normal. Right eye exhibits no discharge. Left eye exhibits no discharge. No scleral icterus.  Neck: Normal range of motion. Neck supple. No JVD present. No tracheal deviation present. No thyromegaly present.  Cardiovascular: Normal rate, regular rhythm, normal heart sounds and intact distal pulses. Exam reveals no gallop and no friction rub.  No murmur heard. Pulmonary/Chest: Effort normal and breath sounds normal. No stridor. No respiratory distress. He has no wheezes. He has no rales. He exhibits no tenderness.  Abdominal: Soft. Bowel sounds are normal. He exhibits no distension and no mass. There is no tenderness. There is no rebound and no guarding. No hernia.  Genitourinary:  Genitourinary Comments: Will do PSA   Musculoskeletal: Normal range of motion. He exhibits no edema, tenderness or deformity.  Lymphadenopathy:    He has no cervical adenopathy.  Neurological: He is alert and oriented to person, place, and time. He displays normal reflexes. No cranial nerve deficit or sensory deficit. He exhibits normal muscle tone. Coordination normal.  Skin: Skin is warm and dry. Capillary refill takes less than 2 seconds. No rash noted. He is not diaphoretic. No erythema. No pallor.  Psychiatric: He has a normal mood  and affect. His behavior is normal. Judgment and thought content normal.  Nursing note and vitals reviewed.     Assessment & Plan:  1. Routine general medical examination at a health care facility - Benign exam  - Continue to exercise and  eat healthy  - CBC with Differential/Platelet - Comprehensive metabolic panel - Lipid panel  2. Hyperlipidemia with target LDL less than 70 - Continue with Crestor 5 mg 3 x week  - CBC with Differential/Platelet - Comprehensive metabolic panel - Lipid panel  3. BPH without obstruction/lower urinary tract symptoms  - PSA  4. Coronary artery disease involving native coronary artery of native heart without angina pectoris - Follow up with Cardiology as directed  - CBC with Differential/Platelet - Comprehensive metabolic panel - Lipid panel  Shirline Freesory Williemae Muriel, NP

## 2018-09-12 ENCOUNTER — Ambulatory Visit: Payer: Medicare Other

## 2018-09-15 ENCOUNTER — Encounter: Payer: Self-pay | Admitting: Adult Health

## 2018-09-15 NOTE — Progress Notes (Deleted)
Subjective:   Trevor [name] is a 72 y.o. male who presents for Medicare Annual/Subsequent preventive examination.  Reports health as Brother with prostate cancer Seeing Dr. Margaretha Sheffieldraper in sports medicine Trevor [name] seen 09/09/2018 hx pericarditis; Trevor [name] syndrome  Diet Chol /hdl 3 A1c 5.9    Exercise  Health Maintenance Due  Topic Date Due  . TETANUS/TDAP  09/09/2018   PSA 08/2018 2.90 Colonoscopy 08/2013  Educate regarding shingrix             Objective:    Vitals: There were no vitals taken for this visit.  There is no height or weight on file to calculate BMI.  Advanced Directives 09/11/2017 07/03/2017 09/26/2015 09/05/2015 11/02/2014  Does Patient Have a Medical Advance Directive? Yes No No No No  Would patient like information on creating a medical advance directive? - - No - patient declined information No - patient declined information No - patient declined information    Tobacco Social History   Tobacco Use  Smoking Status Former Smoker  . Packs/day: 1.00  . Years: 13.00  . Pack years: 13.00  . Types: Cigarettes  . Last attempt to quit: 10/29/1978  . Years since quitting: 39.9  Smokeless Tobacco Never Used  Tobacco Comment   smoked 1967-1980, up to 1 ppd     Counseling given: Not Answered Comment: smoked 1967-1980, up to 1 ppd   Clinical Intake:     Past Medical History:  Diagnosis Date  . Acute pericarditis    a. admitted as a code STEMI but no sig CAD on cath.   . CAD (coronary artery disease)    30% stenosis mid RCA  . Colonic polyp    Dr. Kinnie ScalesMedoff; hyperplastic polyp  . Trevor [name] syndrome   . Hyperlipidemia    Past Surgical History:  Procedure Laterality Date  . actinic keratoses     facial removed x 2, Dr Danella DeisGruber  . basal cell cancer  2013 & 2015   Dr Campbell StallHope Gruber  . CARDIAC CATHETERIZATION [name]/A 10/30/2015   Procedure: Left Heart Cath and Coronary Angiography;  Surgeon: Kathleene Hazelhristopher D McAlhany, MD;  [facility];   Service: Cardiovascular;  Laterality: [name]/A;  . COLONOSCOPY  2014   negative  . COLONOSCOPY W/ POLYPECTOMY  2004   @ age 72, Dr Kinnie ScalesMedoff. ? Hyperplastic polyp  . FLEXIBLE SIGMOIDOSCOPY  1999   negative  . KNEE ARTHROSCOPY  1984   right   Family History  Problem Relation Age of Onset  . Stroke Father 2661  . Alcohol abuse Father   . Asthma Brother   . Breast cancer Mother   . HIV Brother   . Prostate cancer Brother [id number]  . Breast cancer Maternal Aunt   . Breast cancer Maternal Grandmother   . Heart disease Neg Hx   . Diabetes Neg Hx    Social History   Socioeconomic History  . Marital status: Married    Spouse name: Not on file  . Number of children: Not on file  . Years of education: Not on file  . Highest education level: Not on file  Occupational History  . Not on file  Social Needs  . Financial resource strain: Not on file  . Food insecurity:    Worry: Not on file    Inability: Not on file  . Transportation needs:    Medical: Not on file    Non-medical: Not on file  Tobacco Use  . Smoking status: Former Smoker  Packs/day: 1.00    Years: 13.00    Pack years: 13.00    Types: Cigarettes    Last attempt to quit: 10/29/1978    Years since quitting: 39.9  . Smokeless tobacco: Never Used  . Tobacco comment: smoked 1967-1980, up to 1 ppd  Substance and Sexual Activity  . Alcohol use: No    Alcohol/week: 0.0 standard drinks  . Drug use: No  . Sexual activity: Not on file  Lifestyle  . Physical activity:    Days per week: Not on file    Minutes per session: Not on file  . Stress: Not on file  Relationships  . Social connections:    Talks on phone: Not on file    Gets together: Not on file    Attends religious service: Not on file    Active member of club or organization: Not on file    Attends meetings of clubs or organizations: Not on file    Relationship status: Not on file  Other Topics Concern  . Not on file  Social History Narrative   Originally from [place].  Moved to [place] in 1951. He moved to [place] in [place]. 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 Previously has traveled to [place], [place], [place], [place], [place], and [place]. Currently has a cockatiel. No mold exposure. Has a hot tub but doesn't use it regularly.     Outpatient Encounter Medications as of 09/16/2018  Medication Sig  . Ascorbic Acid (VITAMIN C PO) Take 2,000 mg by mouth daily.   Marland Kitchen aspirin 81 MG tablet Take 81 mg by mouth daily.    Marland Kitchen b complex vitamins capsule Take 1 capsule daily by mouth.  . Cholecalciferol (VITAMIN D) 2000 UNITS CAPS Take 2,000 Units by mouth daily.   . fish oil-omega-3 fatty acids 1000 MG capsule Take 1 g by mouth daily.   . Magnesium 250 MG TABS Take 250 mg by mouth daily.   . metroNIDAZOLE (METROCREAM) 0.75 % cream Apply 1 application topically daily.  . Multiple Vitamins-Minerals (MENS 50+ MULTI VITAMIN/MIN PO) Take 1 tablet daily by mouth.  . NON FORMULARY   . rosuvastatin (CRESTOR) 5 MG tablet Take 1 tablet (5 mg total) by mouth 3 (three) times a week.   No facility-administered encounter medications on file as of 09/16/2018.     Activities of Daily Living No flowsheet data found.  Patient Care Team: Shirline Frees, NP as PCP - General (Family Medicine) Kathleene Hazel, MD as PCP - Cardiology (Cardiology) Estill Bamberg, MD as Consulting Physician (Orthopedic Surgery)   Assessment:   This is a routine wellness examination for Trevor [name].  Exercise Activities and Dietary recommendations    Goals    . patient      Look for avenues of growth in other areas; teaching etc        Fall Risk Fall Risk  09/11/2017 07/03/2017 09/05/2016 09/26/2015 09/26/2015  Falls in the past year? No No No No No  Risk for fall due to : - Other (Comment) - Other (Comment) Other (Comment)     Depression Screen PHQ 2/9 Scores 09/11/2017 07/03/2017 09/05/2016 09/26/2015  PHQ - 2 Score 0 - 0 0    Exception Documentation - Other- indicate reason in comment box - Other- indicate reason in comment box    Cognitive Function MMSE - Mini Mental State Exam 09/11/2017  Not completed: (No Data)   Ad8 score reviewed for issues:  Issues making  decisions:  Less interest in hobbies / activities:  Repeats questions, stories (family complaining):  Trouble using ordinary gadgets (microwave, computer, phone):  Forgets the month or year:   Mismanaging finances:   Remembering appts:  Daily problems with thinking and/or memory: Ad8 score is=          Immunization History  Administered Date(s) Administered  . Pneumococcal Conjugate-13 10/21/2015  . Pneumococcal Polysaccharide-23 06/13/2017  . Td 09/09/2008  . Zoster 09/17/2014     Screening Tests Health Maintenance  Topic Date Due  . TETANUS/TDAP  09/09/2018  . INFLUENZA VACCINE  02/26/2019 (Originally 05/29/2018)  . COLONOSCOPY  08/30/2023  . Hepatitis C Screening  Completed  . PNA vac Low Risk Adult  Completed        Plan:      PCP Notes ***  Health Maintenance ***  Abnormal Screens  ***  Referrals  ***  Patient concerns; ***  Nurse Concerns; ***  Next PCP apt ***      I have personally reviewed and noted the following in the patient's chart:   . Medical and social history . Use of alcohol, tobacco or illicit drugs  . Current medications and supplements . Functional ability and status . Nutritional status . Physical activity . Advanced directives . List of other physicians . Hospitalizations, surgeries, and ER visits in previous 12 months . Vitals . Screenings to include cognitive, depression, and falls . Referrals and appointments  In addition, I have reviewed and discussed with patient certain preventive protocols, quality metrics, and best practice recommendations. A written personalized care plan for preventive services as well as general preventive health recommendations were  provided to patient.     Montine Circle, RN  09/15/2018

## 2018-09-16 ENCOUNTER — Ambulatory Visit: Payer: Medicare Other

## 2018-09-18 NOTE — Progress Notes (Addendum)
Subjective:   Trevor [name] is a 72 y.o. male who presents for Medicare Annual/Subsequent preventive examination.  Reports health as great  Routine medical 09/09/2018   Diet Chol/hdl 3 A1c 5.9  PSA 2.90    Exercise Still runs  duatholon (running and riding a bike )  April 16th - MRI disc  Personal trainer; Dr Jonny Ruiz O'Halloran  Back to running at this time  Can discuss AAA check due to smoking history Discussed and will consider; educated      There are no preventive care reminders to display for this patient.  Tdap now due and will hold Educated regarding pertussis as well as taking if he gets injured   colonoscopy 08/2013 completed by Dr. Kinnie Scales and feels it is due in 5 years; 2024  Zoster in 2015; Educated regarding shingrix. Was going to take last year but they were not available        Objective:    Vitals: BP 100/60   Pulse 65   Ht 5\' 7"  (1.702 m)   Wt 161 lb (73 kg)   SpO2 97%   BMI 25.22 kg/m   Body mass index is 25.22 kg/m.  Advanced Directives 09/22/2018 09/11/2017 07/03/2017 09/26/2015 09/05/2015 11/02/2014  Does Patient Have a Medical Advance Directive? Yes Yes No No No No  Would patient like information on creating a medical advance directive? - - - No - patient declined information No - patient declined information No - patient declined information    Tobacco Social History   Tobacco Use  Smoking Status Former Smoker  . Packs/day: 1.00  . Years: 13.00  . Pack years: 13.00  . Types: Cigarettes  . Last attempt to quit: 10/29/1978  . Years since quitting: 39.9  Smokeless Tobacco Never Used  Tobacco Comment   smoked 1967-1980, up to 1 ppd     Counseling given: Yes Comment: smoked 1967-1980, up to 1 ppd 13 pack years; quit in 67 Educated regarding AAA and will consider   Clinical Intake:      Past Medical History:  Diagnosis Date  . Acute pericarditis    a. admitted as a code STEMI but no sig CAD on cath.   . CAD (coronary  artery disease)    30% stenosis mid RCA  . Colonic polyp    Dr. Kinnie Scales; hyperplastic polyp  . Gilbert syndrome   . Hyperlipidemia    Past Surgical History:  Procedure Laterality Date  . actinic keratoses     facial removed x 2, Dr Danella Deis  . basal cell cancer  2013 & 2015   Dr Campbell Stall  . CARDIAC CATHETERIZATION N/A 10/30/2015   Procedure: Left Heart Cath and Coronary Angiography;  Surgeon: Kathleene Hazel, MD;  Location: [facility] INVASIVE CV LAB;  Service: Cardiovascular;  Laterality: N/A;  . COLONOSCOPY  2014   negative  . COLONOSCOPY W/ POLYPECTOMY  2004   @ age 30, Dr Kinnie Scales. ? Hyperplastic polyp  . FLEXIBLE SIGMOIDOSCOPY  1999   negative  . KNEE ARTHROSCOPY  1984   right   Family History  Problem Relation Age of Onset  . Stroke Father 30  . Alcohol abuse Father   . Asthma Brother   . Breast cancer Mother   . HIV Brother   . Prostate cancer Brother 23  . Breast cancer Maternal Aunt   . Breast cancer Maternal Grandmother   . Heart disease Neg Hx   . Diabetes Neg Hx    Social History  Socioeconomic History  . Marital status: Married    Spouse name: Not on file  . Number of children: Not on file  . Years of education: Not on file  . Highest education level: Not on file  Occupational History  . Not on file  Social Needs  . Financial resource strain: Not on file  . Food insecurity:    Worry: Not on file    Inability: Not on file  . Transportation needs:    Medical: Not on file    Non-medical: Not on file  Tobacco Use  . Smoking status: Former Smoker    Packs/day: 1.00    Years: 13.00    Pack years: 13.00    Types: Cigarettes    Last attempt to quit: 10/29/1978    Years since quitting: 39.9  . Smokeless tobacco: Never Used  . Tobacco comment: smoked 1967-1980, up to 1 ppd  Substance and Sexual Activity  . Alcohol use: No    Alcohol/week: 0.0 standard drinks  . Drug use: No  . Sexual activity: Not on file  Lifestyle  . Physical activity:    Days  per week: Not on file    Minutes per session: Not on file  . Stress: Not on file  Relationships  . Social connections:    Talks on phone: Not on file    Gets together: Not on file    Attends religious service: Not on file    Active member of club or organization: Not on file    Attends meetings of clubs or organizations: Not on file    Relationship status: Not on file  Other Topics Concern  . Not on file  Social History Narrative   Originally from [place]. Moved to [place] in 1951. He moved to [place] in 1984. 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 Previously has traveled to [place], [place], [place], [place], [place] and [place]. Currently has a cockatiel. No mold exposure. Has a hot tub but doesn't use it regularly.     Outpatient Encounter Medications as of 09/22/2018  Medication Sig  . Ascorbic Acid (VITAMIN C PO) Take 2,000 mg by mouth daily.   Marland Kitchen. aspirin 81 MG tablet Take 81 mg by mouth daily.    Marland Kitchen. b complex vitamins capsule Take 1 capsule daily by mouth.  . Cholecalciferol (VITAMIN D) 2000 UNITS CAPS Take 2,000 Units by mouth daily.   . fish oil-omega-3 fatty acids 1000 MG capsule Take 1 g by mouth daily.   . Magnesium 250 MG TABS Take 250 mg by mouth daily.   . metroNIDAZOLE (METROCREAM) 0.75 % cream Apply 1 application topically daily.  . Multiple Vitamins-Minerals (MENS 50+ MULTI VITAMIN/MIN PO) Take 1 tablet daily by mouth.  . NON FORMULARY   . rosuvastatin (CRESTOR) 5 MG tablet Take 1 tablet (5 mg total) by mouth 3 (three) times a week.   No facility-administered encounter medications on file as of 09/22/2018.     Activities of Daily Living In your present state of health, do you have any difficulty performing the following activities: 09/22/2018  Hearing? N  Vision? N  Difficulty concentrating or making decisions? N  Walking or climbing stairs? N  Dressing or bathing? N  Doing errands, shopping? N  Preparing Food  and eating ? N  Using the Toilet? N  In the past six months, have you accidently leaked urine? N  Do you have problems with loss of bowel control? N  Managing your Medications? N  Managing your Finances? N  Housekeeping or managing your Housekeeping? N  Some recent data might be hidden    Patient Care Team: Shirline Frees, NP as PCP - General (Family Medicine) Kathleene Hazel, MD as PCP - Cardiology (Cardiology) Estill Bamberg, MD as Consulting Physician (Orthopedic Surgery)   Assessment:   This is a routine wellness examination for Trevor [name].  Exercise Activities and Dietary recommendations    Goals    . Exercise 150 min/wk Moderate Activity     Keep running and enjoy your exercise routine     . patient      Look for avenues of growth in other areas; teaching etc        Fall Risk Fall Risk  09/22/2018 09/11/2017 07/03/2017 09/05/2016 09/26/2015  Falls in the past year? 0 No No No No  Risk for fall due to : - - Other (Comment) - Other (Comment)    Depression Screen PHQ 2/9 Scores 09/22/2018 09/11/2017 07/03/2017 09/05/2016  PHQ - 2 Score 0 0 - 0  Exception Documentation - - Other- indicate reason in comment box -    Cognitive Function MMSE - Mini Mental State Exam 09/22/2018 09/11/2017  Not completed: (No Data) (No Data)   Ad8 score reviewed for issues:  Issues making decisions:  Less interest in hobbies / activities:  Repeats questions, stories (family complaining):  Trouble using ordinary gadgets (microwave, computer, phone):  Forgets the month or year:   Mismanaging finances:   Remembering appts:  Daily problems with thinking and/or memory: Ad8 score is=0 Discussed memory is assisted by living "heart healthy" Watch processed sugar, exercise, read, engage           Immunization History  Administered Date(s) Administered  . Pneumococcal Conjugate-13 10/21/2015  . Pneumococcal Polysaccharide-23 06/13/2017  . Td 09/09/2008  . Zoster  09/17/2014      Screening Tests Health Maintenance  Topic Date Due  . INFLUENZA VACCINE  02/26/2019 (Originally 05/29/2018)  . TETANUS/TDAP  09/23/2019 (Originally 09/09/2018)  . COLONOSCOPY  08/30/2023 (Originally 08/29/2018)  . Hepatitis C Screening  Completed  . PNA vac Low Risk Adult  Completed         Plan:      PCP Notes   Health Maintenance Tdap now due and will hold Educated regarding pertussis as well as taking if he gets injured   colonoscopy 08/2013 completed by Dr. Kinnie Scales and feels it is due in 5 years; 2024  Zoster in 2015; Educated regarding shingrix. Was going to take last year but they were not available     Abnormal Screens  none  Referrals  none  Patient concerns; As noted   Nurse Concerns; As noted   Next PCP apt Just seen last week       I have personally reviewed and noted the following in the patient's chart:   . Medical and social history . Use of alcohol, tobacco or illicit drugs  . Current medications and supplements . Functional ability and status . Nutritional status . Physical activity . Advanced directives . List of other physicians . Hospitalizations, surgeries, and ER visits in previous 12 months . Vitals . Screenings to include cognitive, depression, and falls . Referrals and appointments  In addition, I have reviewed and discussed with patient certain preventive protocols, quality metrics, and best practice recommendations. A written personalized care plan for preventive services as well as general preventive health recommendations were provided to patient.     Dajanay Northrup, RN  09/22/2018  I have reviewed the documentation for the AWV and Advanced Care Planning provided by the health coach and agree with their documentation. I was immediately available for any questions  Kristian Covey MD [facility] at [facility]

## 2018-09-22 ENCOUNTER — Ambulatory Visit (INDEPENDENT_AMBULATORY_CARE_PROVIDER_SITE_OTHER): Payer: Medicare Other

## 2018-09-22 VITALS — BP 100/60 | HR 65 | Ht 67.0 in | Wt 161.0 lb

## 2018-09-22 DIAGNOSIS — Z0000 Encounter for general adult medical examination without abnormal findings: Secondary | ICD-10-CM | POA: Diagnosis not present

## 2018-09-22 NOTE — Patient Instructions (Addendum)
Trevor [name] , Thank you for taking time to come for your Medicare Wellness Visit. I appreciate your ongoing commitment to your health goals. Please review the following plan we discussed and let me know if I can assist you in the future.   Men Ages 100 to 75 Years who Have Ever Smoked  The USPSTF recommends one-time screening for abdominal aortic aneurysm (AAA) with ultrasonography in men ages 20 to 30 years who have ever smoked.   A Tetanus is recommended every 10 years. Medicare covers a tetanus if you have a cut or wound; otherwise, there may be a charge. If you had not had a tetanus with pertusses, known as the Tdap, you can take this anytime.     These are the goals we discussed: Goals    . Exercise 150 min/wk Moderate Activity     Keep running and enjoy your exercise routine     . patient      Look for avenues of growth in other areas; teaching etc        This is a list of the screening recommended for you and due dates:  Health Maintenance  Topic Date Due  . Tetanus Vaccine  09/09/2018  . Flu Shot  02/26/2019*  . Colon Cancer Screening  08/30/2023*  .  Hepatitis C: One time screening is recommended by Center for Disease Control  (CDC) for  adults born from [birth year] through 1963.   Completed  . Pneumonia vaccines  Completed  *Topic was postponed. The date shown is not the original due date.    Summary: Preventive Care for Adults  A healthy lifestyle and preventive care can promote health and wellness. Preventive health guidelines for adults include the following key practices.  . A routine yearly physical is a good way to check with your health care provider about your health and preventive screening. It is a chance to share any concerns and updates on your health and to receive a thorough exam.  . Visit your dentist for a routine exam and preventive care every 6 months. Brush your teeth twice a day and floss once a day. Good oral hygiene prevents tooth decay and gum  disease.  . The frequency of eye exams is based on your age, health, family medical history, use  of contact lenses, and other factors. Follow your health care provider's ecommendations for frequency of eye exams.  . Eat a healthy diet. Foods like vegetables, fruits, whole grains, low-fat dairy products, and lean protein foods contain the nutrients you need without too many calories. Decrease your intake of foods high in solid fats, added sugars, and salt. Eat the right amount of calories for you. Get information about a proper diet from your health care provider, if necessary.  . Regular physical exercise is one of the most important things you can do for your health. Most adults should get at least 150 minutes of moderate-intensity exercise (any activity that increases your heart rate and causes you to sweat) each week. In addition, most adults need muscle-strengthening exercises on 2 or more days a week.  Silver Sneakers may be a benefit available to you. To determine eligibility, you may visit the website: www.silversneakers.com or contact program at 332-710-0858 Mon-Fri between 8AM-8PM.   . Maintain a healthy weight. The body mass index (BMI) is a screening tool to identify possible weight problems. It provides an estimate of body fat based on height and weight. Your health care provider can find your BMI and can  help you achieve or maintain a healthy weight.   For adults 20 years and older: ? A BMI below 18.5 is considered underweight. ? A BMI of 18.5 to 24.9 is normal. ? A BMI of 27 to 28 is considered normal by the Institutes of Health  ? A BMI of 30 and above is considered obese.   . Maintain normal blood lipids and cholesterol levels by exercising and minimizing your intake of saturated fat. Eat a balanced diet with plenty of fruit and vegetables. Blood tests for lipids and cholesterol should begin at age 70 and be repeated every 5 years. If your lipid or cholesterol levels  are high, you are over 50, or you are at high risk for heart disease, you may need your cholesterol levels checked more frequently. Ongoing high lipid and cholesterol levels should be treated with medicines if diet and exercise are not working.  . If you smoke, find out from your health care provider how to quit. If you do not use tobacco, please do not start.  . If you choose to drink alcohol, please do not consume more than one drink for women and 2 for men.  One drink is considered to be 12 ounces (355 mL) of beer, 5 ounces (148 mL) of wine, or 1.5 ounces (44 mL) of liquor. Moderation of alcohol intake to this level decreases your risk of breast cancer and liver damage.   . If you are 6655-654 years old, ask your health care provider if you should take aspirin to prevent strokes.  . Use sunscreen. Apply sunscreen liberally and repeatedly throughout the day. You should seek shade when your shadow is shorter than you. Protect yourself by wearing long sleeves, pants, a wide-brimmed hat, and sunglasses year round, whenever you are outdoors.  . Once a month, do a whole body skin exam, using a mirror to look at the skin on your back. Tell your health care provider of new moles, moles that have irregular borders, moles that are larger than a pencil eraser, or moles that have changed in shape or color.  Last, if you have completed an Advanced Directive; please bring a copy and review with your physician and then we will scan to the medical record

## 2018-09-27 ENCOUNTER — Other Ambulatory Visit: Payer: Self-pay | Admitting: Cardiovascular Disease

## 2018-10-28 ENCOUNTER — Ambulatory Visit: Payer: Medicare Other | Admitting: Sports Medicine

## 2018-11-05 ENCOUNTER — Ambulatory Visit: Payer: Medicare Other | Admitting: Family Medicine

## 2018-11-10 ENCOUNTER — Encounter: Payer: Self-pay | Admitting: Family Medicine

## 2018-11-10 ENCOUNTER — Ambulatory Visit (INDEPENDENT_AMBULATORY_CARE_PROVIDER_SITE_OTHER): Payer: Medicare Other | Admitting: Family Medicine

## 2018-11-10 VITALS — BP 118/60 | HR 57 | Temp 97.7°F | Ht 67.0 in | Wt 158.8 lb

## 2018-11-10 DIAGNOSIS — I2510 Atherosclerotic heart disease of native coronary artery without angina pectoris: Secondary | ICD-10-CM | POA: Diagnosis not present

## 2018-11-10 DIAGNOSIS — M79605 Pain in left leg: Secondary | ICD-10-CM

## 2018-11-10 DIAGNOSIS — E785 Hyperlipidemia, unspecified: Secondary | ICD-10-CM | POA: Diagnosis not present

## 2018-11-10 DIAGNOSIS — M79604 Pain in right leg: Secondary | ICD-10-CM

## 2018-11-10 NOTE — Progress Notes (Signed)
Subjective:  Trevor [name] is a 73 y.o. male who presents today with a chief complaint of dyslipidemia and to transfer care to this office.   HPI:  CAD/HLD Several year history.  Follows with cardiology.  Currently on Crestor 5 mg 3 times daily.  Occasionally has bilateral leg pain with this.  He runs frequently. No chest pain or shortness of breath.   ROS: Per HPI, otherwise a complete review of systems was negative.   PMH:  The following were reviewed and entered/updated in epic: Past Medical History:  Diagnosis Date  . Acute pericarditis    a. admitted as a code STEMI but no sig CAD on cath.   . CAD (coronary artery disease)    30% stenosis mid RCA  . Colonic polyp    Dr. Kinnie ScalesMedoff; hyperplastic polyp  . Gilbert syndrome   . Hyperlipidemia    Patient Active Problem List   Diagnosis Date Noted  . Left leg pain 02/17/2018  . Bilateral knee pain 06/18/2017  . Deficiency anemia 09/03/2016  . Gastroesophageal reflux disease without esophagitis 09/03/2016  . Exercise-induced asthma 03/14/2016  . CAD (coronary artery disease)   . Gilbert syndrome 09/06/2014  . History of basal cell cancer 08/31/2013  . Hyperplastic colonic polyp 09/09/2008  . Hyperlipidemia with target LDL less than 70 09/01/2007  . BPH without obstruction/lower urinary tract symptoms 09/01/2007   Past Surgical History:  Procedure Laterality Date  . actinic keratoses     facial removed x 2, Dr Danella DeisGruber  . basal cell cancer  2013 & 2015   Dr Campbell StallHope Gruber  . CARDIAC CATHETERIZATION N/A 10/30/2015   Procedure: Left Heart Cath and Coronary Angiography;  Surgeon: Kathleene Hazelhristopher D McAlhany, MD;  [facility] INVASIVE [facility] LAB;  Service: Cardiovascular;  Laterality: N/A;  . COLONOSCOPY  2014   negative  . COLONOSCOPY W/ POLYPECTOMY  2004   @ age 73, Dr Kinnie ScalesMedoff. ? Hyperplastic polyp  . FLEXIBLE SIGMOIDOSCOPY  1999   negative  . KNEE ARTHROSCOPY  1984   right    Family History  Problem Relation Age of Onset    . Stroke Father 461  . Alcohol abuse Father   . Asthma Brother   . Breast cancer Mother   . HIV Brother   . Prostate cancer Brother [id number]  . Breast cancer Maternal Aunt   . Breast cancer Maternal Grandmother   . Heart disease Neg Hx   . Diabetes Neg Hx     Medications- reviewed and updated Current Outpatient Medications  Medication Sig Dispense Refill  . aspirin 81 MG tablet Take 81 mg by mouth daily.      . Cholecalciferol (VITAMIN D) 2000 UNITS CAPS Take 2,000 Units by mouth daily.     . fish oil-omega-3 fatty acids 1000 MG capsule Take 1 g by mouth daily.     . Magnesium 250 MG TABS Take 250 mg by mouth daily.     . metroNIDAZOLE (METROCREAM) 0.75 % cream Apply 1 application topically daily.    . Multiple Vitamins-Minerals (MENS 50+ MULTI VITAMIN/MIN PO) Take 1 tablet daily by mouth.    . NON FORMULARY     . rosuvastatin (CRESTOR) 5 MG tablet Take 1 tablet (5 mg total) by mouth 3 (three) times a week. 15 tablet 4   No current facility-administered medications for this visit.     Allergies-reviewed and updated Allergies  Allergen Reactions  . Codeine Anaphylaxis    Mental status changes  . Pepcid [Famotidine] Shortness  Of Breath  . Prilosec [Omeprazole] Shortness Of Breath  . Crestor [Rosuvastatin Calcium] Other (See Comments)    Severe fatigue  . Niacin Other (See Comments)    flushing    Social History   Socioeconomic History  . Marital status: Married    Spouse name: Not on file  . Number of children: Not on file  . Years of education: Not on file  . Highest education level: Not on file  Occupational History  . Not on file  Social Needs  . Financial resource strain: Not on file  . Food insecurity:    Worry: Not on file    Inability: Not on file  . Transportation needs:    Medical: Not on file    Non-medical: Not on file  Tobacco Use  . Smoking status: Former Smoker    Packs/day: 1.00    Years: 13.00    Pack years: 13.00    Types: Cigarettes    Last  attempt to quit: 10/29/1978    Years since quitting: 40.0  . Smokeless tobacco: Never Used  . Tobacco comment: smoked 1967-1980, up to 1 ppd  Substance and Sexual Activity  . Alcohol use: No    Alcohol/week: 0.0 standard drinks  . Drug use: No  . Sexual activity: Not on file  Lifestyle  . Physical activity:    Days per week: Not on file    Minutes per session: Not on file  . Stress: Not on file  Relationships  . Social connections:    Talks on phone: Not on file    Gets together: Not on file    Attends religious service: Not on file    Active member of club or organization: Not on file    Attends meetings of clubs or organizations: Not on file    Relationship status: Not on file  Other Topics Concern  . Not on file  Social History Narrative   Originally from [place]. Moved to [place] in 1951. He moved to [place] in 1984. 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 Previously has traveled to [place], [place], [place], [place], [place], [place] and [place]. Currently has a cockatiel. No mold exposure. Has a hot tub but doesn't use it regularly.    Objective:  Physical Exam: BP 118/60 (BP Location: Left Arm, Patient Position: Sitting, Cuff Size: Normal)   Pulse (!) 57   Temp 97.7 F (36.5 C) (Oral)   Ht 5\' 7"  (1.702 m)   Wt 158 lb 12 oz (72 kg)   SpO2 97%   BMI 24.86 kg/m   Gen: NAD, resting comfortably CV: RRR with no murmurs appreciated Pulm: NWOB, CTAB with no crackles, wheezes, or rhonchi GI: Normal bowel sounds present. Soft, Nontender, Nondistended. MSK: No edema, cyanosis, or clubbing noted Skin: Warm, dry Neuro: Grossly normal, moves all extremities Psych: Normal affect and thought content  Assessment/Plan:  Hyperlipidemia with target LDL less than 70 Continue Crestor 5 mg 3 times weekly.  Recommended addition of co-Q10 to see if this helps with his muscle cramps.  He will follow-up with me later this year for physical blood  work.  CAD (coronary artery disease) Continue statin and aspirin.  Time Spent: I spent >25 minutes face-to-face with the patient, with more than half spent on coordinating care and counseling for management plan for his HLD and leg pain.   Katina Degreealeb M. Jimmey RalphParker, MD 11/10/2018 1:14 PM

## 2018-11-10 NOTE — Patient Instructions (Signed)
It was very nice to see you today!  Please try taking co-q-10 for your muscle cramps.  You can stop the vitamin B and C.  No other changes today.  Come back in November for your physical or sooner as needed.   Take care, Dr [name]

## 2018-11-10 NOTE — Assessment & Plan Note (Signed)
Management per specialist. 

## 2018-11-10 NOTE — Assessment & Plan Note (Signed)
Continue Crestor 5 mg 3 times weekly.  Recommended addition of co-Q10 to see if this helps with his muscle cramps.  He will follow-up with me later this year for physical blood work.

## 2018-12-04 ENCOUNTER — Telehealth: Payer: Self-pay | Admitting: Family Medicine

## 2018-12-04 NOTE — Telephone Encounter (Signed)
See note  Copied from [place] [id number]. Topic: General - Inquiry >> Dec 04, 2018 10:45 AM Conception Chancy, NT wrote: Reason for CRM: patient is calling and is wanting to know if he can come in for a shingle shot if they are in stock and how much that would cost since he has not met his deductible.

## 2018-12-04 NOTE — Telephone Encounter (Signed)
LM informing patient that he needs to get the vaccine at a pharmacy because he has medicare and it is not covered at all by medicare if given in a doctor's office.  Also, he needs to contact his insurance company regarding the cost.  CRM placed.

## 2018-12-09 ENCOUNTER — Ambulatory Visit: Payer: Medicare Other | Admitting: Sports Medicine

## 2018-12-09 ENCOUNTER — Encounter: Payer: Self-pay | Admitting: Sports Medicine

## 2018-12-09 VITALS — BP 116/74 | Ht 67.0 in | Wt 154.0 lb

## 2018-12-09 DIAGNOSIS — M5126 Other intervertebral disc displacement, lumbar region: Secondary | ICD-10-CM

## 2018-12-10 ENCOUNTER — Encounter: Payer: Self-pay | Admitting: Sports Medicine

## 2018-12-10 NOTE — Progress Notes (Signed)
   Subjective:    Patient ID: Trevor [name], male    DOB: 07-15-1946, 73 y.o.   MRN: [id number]  HPI   Trevor [name] comes in today with concerns about his left leg.  He has responded favorably to conservative treatment for a left-sided L5-S1 disc herniation.  In fact, he is back to running half marathons and is very happy.  He is concerned because he has had a couple of episodes where he has tripped and fallen.  Both of these episodes were on sidewalk.  His most recent fall occurred fairly early into a long run.  He is here today primarily for me to evaluate him to see if his previous left leg weakness may be playing a role.  He denies any pain.  He does have chronic numbness along the lateral aspect of his left foot.  He does not endorse any weakness at all.  Interim medical history reviewed Medications reviewed Allergies reviewed   Review of Systems    As above Objective:   Physical Exam  Well-developed, well-nourished.  No acute distress.  Awake alert and oriented x3.  Vital signs reviewed  Neurological exam: Patient's left calf measures 36 cm at mid calf.  Right calf measures 38 cm at mid calf.  Reflexes are brisk and equal at both the Achilles and patellar tendons bilaterally.  His strength is 5/5 bilaterally including with resisted left foot dorsiflexion and plantar flexion.  Patient is able to ambulate on heels and toes without any obvious weakness.  He does have decreased sensation along the lateral aspect of his left foot.  Gait is normal.      Assessment & Plan:   Left-sided L5-S1 disc herniation  I cannot find any objective evidence on physical exam today to suggest that he is getting lower extremity weakness contributing to his recent falls.  It does not sound like he gets weakness or fatigue in his leg with long runs.  Both of his falls could be attributed to an uneven sidewalk.  At this point I recommended that we simply take a watchful waiting approach and have educated Trevor [name]  about watching for signs of weakness or fatigue in the left leg or foot while running. I've also recommended trying to run on a more level surface if possible. If he does continue to have issues then we will need to reevaluate.  Follow-up for ongoing or recalcitrant issues.

## 2018-12-11 ENCOUNTER — Encounter: Payer: Self-pay | Admitting: Family Medicine

## 2018-12-19 ENCOUNTER — Other Ambulatory Visit: Payer: Self-pay | Admitting: Cardiovascular Disease
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
## 2019-03-04 ENCOUNTER — Other Ambulatory Visit: Payer: Self-pay | Admitting: Cardiovascular Disease

## 2019-03-04 MED ORDER — ROSUVASTATIN CALCIUM 5 MG PO TABS: 5.0000 mg | ORAL_TABLET | ORAL | 0 refills | Status: DC

## 2019-04-09 ENCOUNTER — Other Ambulatory Visit: Payer: Self-pay | Admitting: Cardiovascular Disease

## 2019-04-09 MED ORDER — ROSUVASTATIN CALCIUM 5 MG PO TABS: 5.0000 mg | ORAL_TABLET | ORAL | 0 refills | Status: DC

## 2019-04-29 ENCOUNTER — Other Ambulatory Visit: Payer: Self-pay | Admitting: Cardiovascular Disease

## 2019-04-29 MED ORDER — ROSUVASTATIN CALCIUM 5 MG PO TABS: 5.0000 mg | ORAL_TABLET | ORAL | 7 refills | Status: DC

## 2019-04-29 NOTE — Telephone Encounter (Signed)
Pt's medication was sent to pt's pharmacy as requested. Pt's LOV on 11/2017, Dr. Angelena Form stated that pt does not need to be seen for 2 years. Confirmation received.

## 2019-05-07 ENCOUNTER — Other Ambulatory Visit: Payer: Self-pay

## 2019-05-07 ENCOUNTER — Ambulatory Visit (INDEPENDENT_AMBULATORY_CARE_PROVIDER_SITE_OTHER): Payer: Medicare Other | Admitting: Sports Medicine

## 2019-05-07 ENCOUNTER — Ambulatory Visit: Payer: Self-pay

## 2019-05-07 ENCOUNTER — Encounter: Payer: Self-pay | Admitting: Sports Medicine

## 2019-05-07 VITALS — BP 126/60 | Ht 67.0 in | Wt 152.0 lb

## 2019-05-07 DIAGNOSIS — M222X2 Patellofemoral disorders, left knee: Secondary | ICD-10-CM | POA: Diagnosis not present

## 2019-05-07 DIAGNOSIS — M25562 Pain in left knee: Secondary | ICD-10-CM | POA: Diagnosis not present

## 2019-05-07 NOTE — Patient Instructions (Addendum)
For knee pain: Do quad strengthening exercises daily as well as hip exercises provided May take ibuprofen intermittently and ice after runs for pain relief   Community COVID testing sites   Saturday, June 6  [place] 10 a.m. - 2 p.m.  Drive up and walk ups are encouraged. Please bring a photo ID. Those with a primary care provider should call their provider and follow their medical guidance on whether they need a test. People coming for testing should wear a mask. A swab will be used to collect a sample and test results will be returned in approximately 72 hours.     "[facility] knows how critical wide-spread coronavirus testing is to slow the spread of this disease. Many of our community members do not have a regular doctor they see for normal health checkups. We want to remove as many barriers to testing as possible for the health and safety of our community," says Dr. Judeth Horn, medical director, [place] is free, though people with insurance should bring their card. Insurance coverage is not required. People living or working in places with lots of public interaction such as group homes, nursing homes, bus drivers and retail clerks are encouraged to be tested. People who have serious medical conditions such as diabetes, heart disease, obesity or other COVID-19 risk factors are also urged to be tested.    The testing is for both adults and children.  [facility] is providing FREE testing at specified locations throughout the county for [facility] residents. Please see the locations and times below.  More locations will be shared as they become available.   Monday, July 6, Tuesday, July 7 and Friday, July [facility] [address] [place], [place] [address] 10:00am-2:00pm  Monday, July 13, Wednesday, July  15 and Friday, July 17 [facility] [address] [place], [place] [address] 10:00am-2:00pm  For fast track appointments, please call [phone] [place] Menghini will be served, but the wait may be longer Indoor Testing Event Those who are planning to come to a testing site are asked to please wear a face covering

## 2019-05-07 NOTE — Progress Notes (Signed)
  Trevor [name] - 73 y.o. male MRN [id number]  Date of birth: 01/10/1946  SUBJECTIVE:  Including CC & ROS.  CC: left knee pain   Mr. Couper is a 73 yo male with history of mild patellofemoral DJD and L5-S1 disc herniation, right patellofemoral syndrome who is presenting with anterior left knee pain for the past 6 weeks. He reports that onset of pain first began towards the end of his 6 mile runs. Now it is starting towards the middle of his run a few miles in. He runs ~20 miles a week with four 4-6 mile runs. Prior to [place] pandemic, he was active in a running group and went to the gym twice a week. He has not been doing quad strengthening exercises as diligently since gyms have closed. He gets some benefits with epsom salts twice weekly and has been using tylenol with minimal effect. He has not been using ibuprofen as he has heard that may increase severity of COVID.He takes tumeric for arthritis and reports that its working very well. No acute injuries, no numbness or tingling in legs. No swelling, catching, or locking noted. He has had cramping in his left calf three times in the past two months.   ROS: No unexpected weight loss, fever, chills, swelling, instability, muscle pain, numbness/tingling, redness, otherwise see HPI  He has had post-nasal drip the past month or so and is interested in being tested for COVID.  PMHx - Updated and reviewed.  Contributory factors include: Negative PSHx - Updated and reviewed.  Contributory factors include:  Negative FHx - Updated and reviewed.  Contributory factors include:  Negative Social Hx - Updated and reviewed. Contributory factors include: Negative Medications - reviewed   DATA REVIEWED: none  PHYSICAL EXAM:  VS: BP:126/60  HR: bpm  TEMP: ( )  RESP:   HT:5\' 7"  (170.2 cm)   WT:152 lb (68.9 kg)  BMI:23.8 PHYSICAL EXAM: Gen: NAD, alert, cooperative with exam, well-appearing HEENT: clear conjunctiva,  CV:  no edema, capillary refill  brisk, normal rate Resp: non-labored Skin: no rashes, normal turgor  Neuro: no gross deficits.  Psych:  alert and oriented   Left knee:  Inspection: no effusion, ecchymosis, erythema. Right quadriceps notably smaller than left Palpation: No tenderness to palpation over medial or lateral joint lines, over anterior knee  ROM: Full range of motion through . No significant patellofemoral crepitus.  Special test: no pain with valguls or varus stress, negative anterior drawer sign, Negative McMurray's.  Neurovascularly intact distally.  ASSESSMENT & PLAN:  Right knee pain- likely patellofemoral pain given location anterior knee pain during running, notable quadriceps atrophy and reduced frequency of strength training. No joint effusion or pain with testing to indicate acute injury.  Plan: - provided quadricep and hip strengthening exercises, recommended doing daily - may continue to run with pain as his guide - may use ibuprofen intermittently for pain and ice PRN for pain relief  - return PRN  Patient seen and evaluated with the sports medicine fellow.  I agree with the above plan of care.  Liliane Channel likely has some patellofemoral osteoarthritis.  He definitely has some quadriceps atrophy and weakness.  Home exercise program consisting of quad strengthening and hip abductor strengthening as above.  He may continue with activity as tolerated.  If symptoms persist, I would consider imaging in the form of x-ray and MSK ultrasound.  Follow-up for ongoing or recalcitrant issues.

## 2019-06-02 ENCOUNTER — Encounter: Payer: Self-pay | Admitting: Family Medicine

## 2019-06-11 ENCOUNTER — Encounter

## 2019-06-11 ENCOUNTER — Ambulatory Visit (INDEPENDENT_AMBULATORY_CARE_PROVIDER_SITE_OTHER): Payer: Medicare Other | Admitting: Family Medicine

## 2019-06-11 DIAGNOSIS — R05 Cough: Secondary | ICD-10-CM

## 2019-06-11 DIAGNOSIS — R059 Cough, unspecified: Secondary | ICD-10-CM

## 2019-06-11 MED ORDER — PANTOPRAZOLE SODIUM 40 MG PO TBEC: 40.0000 mg | DELAYED_RELEASE_TABLET | Freq: Every day | ORAL | 3 refills | Status: DC

## 2019-06-11 NOTE — Progress Notes (Signed)
    Chief Complaint:  Trevor [name] is a 73 y.o. male who presents today for a virtual office visit with a chief complaint of cough.   Assessment/Plan:  Cough Postnasal drip versus reflux.  He is more concerned about reflux this point.  We will start Protonix 40 mg daily.  Given symptoms of been persistent for the past several months, will also check chest x-ray.  He will follow-up with me in a couple of weeks.  If no improvement would consider trial of treatment for postnasal drip.    Subjective:  HPI:  Cough Started 3-4 months ago. Initially thought it was due to pollen but symptoms have been persistent for the last few months. Wife also notes that he has been clearing out the attic and has been stirring up a lot of dust which could potentially also be contributing.  No fevers.  Has had quite a bit of postnasal drip.  Is able to: Runs for 3 to 4 miles without any sort of symptoms.  Does not have any sort of chest pain.  No shortness of breath.  Symptoms occur randomly.  No specific treatments tried.  Also has noticed increased reflux recently and has not taken anything for this.  He was on Prilosec in the past.  Had a poor reaction to Pepcid in the past.  Not currently taking anything for reflux.  ROS: Per HPI  PMH: He reports that he quit smoking about 40 years ago. His smoking use included cigarettes. He has a 13.00 pack-year smoking history. He has never used smokeless tobacco. He reports that he does not drink alcohol or use drugs.      Objective/Observations  Physical Exam: Gen: NAD, resting comfortably Pulm: Normal work of breathing Neuro: Grossly normal, moves all extremities Psych: Normal affect and thought content  Virtual Visit via Video   I connected with Trevor [name] on 06/11/19 at  4:20 PM EDT by a video enabled telemedicine application and verified that I am speaking with the correct person using two identifiers. I discussed the limitations of evaluation  and management by telemedicine and the availability of in person appointments. The patient expressed understanding and agreed to proceed.   Patient location: Home Provider location: [place] participating in the virtual visit: Myself and Patient     Algis Greenhouse. Jerline Pain, MD 06/11/2019 4:13 PM

## 2019-06-12 ENCOUNTER — Other Ambulatory Visit: Payer: Self-pay

## 2019-06-12 ENCOUNTER — Telehealth: Payer: Self-pay

## 2019-06-12 DIAGNOSIS — R059 Cough, unspecified: Secondary | ICD-10-CM

## 2019-06-12 DIAGNOSIS — R05 Cough: Secondary | ICD-10-CM

## 2019-06-12 NOTE — Telephone Encounter (Signed)
Copied from [place] [id number]. Topic: Appointment Scheduling - Scheduling Inquiry for Clinic >> Jun 11, 2019  4:59 PM Reyne Dumas L wrote: Reason for CRM:  Pt just had a virtual visit and was told to call back to have someone schedule an x-ray for him for tomorrow.

## 2019-06-12 NOTE — Telephone Encounter (Signed)
Patient has been scheduled and order has been placed.

## 2019-06-15 ENCOUNTER — Other Ambulatory Visit: Payer: Self-pay

## 2019-06-15 ENCOUNTER — Ambulatory Visit (INDEPENDENT_AMBULATORY_CARE_PROVIDER_SITE_OTHER): Payer: Medicare Other

## 2019-06-15 ENCOUNTER — Other Ambulatory Visit: Payer: Medicare Other

## 2019-06-15 DIAGNOSIS — R05 Cough: Secondary | ICD-10-CM

## 2019-06-15 DIAGNOSIS — R059 Cough, unspecified: Secondary | ICD-10-CM

## 2019-06-15 NOTE — Progress Notes (Signed)
Please inform patient of the following:  Xray is NORMAL. Would like for him to continue the acid blocker and let me know if symptoms are not improving.  Algis Greenhouse. Jerline Pain, MD 06/15/2019 1:27 PM

## 2019-07-07 ENCOUNTER — Ambulatory Visit (INDEPENDENT_AMBULATORY_CARE_PROVIDER_SITE_OTHER): Payer: Medicare Other | Admitting: Sports Medicine

## 2019-07-07 ENCOUNTER — Encounter: Payer: Self-pay | Admitting: Sports Medicine

## 2019-07-07 ENCOUNTER — Other Ambulatory Visit: Payer: Self-pay

## 2019-07-07 ENCOUNTER — Ambulatory Visit
Admission: RE | Admit: 2019-07-07 | Discharge: 2019-07-07 | Disposition: A | Discharge status: Home / Self Care | Payer: Medicare Other | Source: Ambulatory Visit | Attending: Sports Medicine | Admitting: Sports Medicine

## 2019-07-07 VITALS — BP 124/72 | Ht 67.0 in | Wt 142.0 lb

## 2019-07-07 DIAGNOSIS — M25562 Pain in left knee: Secondary | ICD-10-CM

## 2019-07-07 NOTE — Progress Notes (Addendum)
[facility] [address] [place], [place] [address] Phone: [phone] Fax: [phone]   Patient Name: Trevor [name] Date of Birth: June 17, 1946 Medical Record Number: [id number] Gender: male Date of Encounter: 07/07/2019  SUBJECTIVE:      Chief Complaint:  Left knee pain follow-up   HPI:  Trevor [name] is returning for a 74-month follow-up on his left knee.  He has been using the knee brace and doing home exercises with minimal improvement.  Tried running 3 miles last week, and the next day had significant pain without swelling on the lateral part of his knee.  Pain is aggravated with active knee flexion, describes his knee catching when trying to flex more than 90 degrees.  Alleviating factors include rest, Epsom salt baths, and ice.  He denies instability or weakness.  Denies radiating pain, numbness, swelling, ecchymosis, skin changes.     ROS:     See HPI.   PERTINENT  PMH / PSH / FH / SH:  Past Medical, Surgical, Social, and Family History Reviewed & Updated in the EMR.    OBJECTIVE:  BP 124/72   Ht 5\' 7"  (1.702 m)   Wt 142 lb (64.4 kg)   BMI 22.24 kg/m  Physical Exam:  Vital signs are reviewed.   GEN: Alert and oriented, NAD Pulm: Breathing unlabored PSY: normal mood, congruent affect  MSK: Left knee: Normal to inspection with no erythema or effusion or obvious bony abnormalities. Palpation normal with no warmth, joint line tenderness, patellar tenderness, or condyle tenderness. ROM extension and lower leg rotation. Decreased ROM in flexion, improved passively, but with pain Ligaments with solid consistent endpoints including ACL, PCL, LCL, MCL. Positive Mcmurray's and Thessaly tests. Non painful patellar compression. Patellar glide without crepitus. Patellar and quadriceps tendons unremarkable. Hamstring and quadriceps strength is 4/5 Neurovascularly intact.  Right knee: Normal to inspection with no erythema or effusion or  obvious bony abnormalities. Palpation normal with no warmth, joint line tenderness, patellar tenderness, or condyle tenderness. ROM full in flexion and extension and lower leg rotation. Ligaments with solid consistent endpoints including ACL, PCL, LCL, MCL. Negative Mcmurray's and Thessaly tests. Non painful patellar compression. Patellar glide without crepitus. Patellar and quadriceps tendons unremarkable. Hamstring and quadriceps strength is normal.  Neurovascularly intact.   ASSESSMENT & PLAN:   1. Left knee pain  Given that patient has failed conservative measures and new symptom of catching, we will repeat x-ray today to assess for possible loose body.  Viewed x-rays from 2018 imaging that show tricompartmental osteoarthritis.  Given the provocative testing, we ordered the MRI of the knee to assess for meniscus pathology.  Patient will follow-up after MRI exam.  Can continue to use elliptical.   Lanier Clam, DO, [facility] Sports Medicine Fellow  Patient seen and evaluated with the sports medicine fellow.  I agree with the above plan of care.  X-rays show a paucity of arthritic change.  No obvious loose body.  Patient has mechanical symptoms as well as an inability to completely flex the left knee.  He has not improved with conservative treatment over the past 2 months.  Proceed with MRI to rule out meniscal tear.  Phone follow-up when those results are available.  We will delineate further treatment based on those findings.  In the meantime, I have encouraged him to continue with his isometric quad strengthening exercises and to cross train on an elliptical instead of running.  Addendum: I spoke with Trevor [name] on the phone after reviewing  his x-rays.  He would like to wait on the MRI for now.  He is also questioning whether or not his gait may have changed as a result of his previous lumbar disc herniation.  I explained to him that that is certainly a possibility and I would be happy to have him  return for a gait analysis.  He is going to cross train for a couple of weeks and simply see how things go.  He will let me know how things progress.

## 2019-07-09 ENCOUNTER — Other Ambulatory Visit: Payer: Self-pay | Admitting: *Deleted

## 2019-07-09 MED ORDER — DIAZEPAM 5 MG PO TABS: ORAL_TABLET | ORAL | 0 refills | Status: DC

## 2019-07-12 ENCOUNTER — Other Ambulatory Visit: Payer: Self-pay

## 2019-07-12 ENCOUNTER — Ambulatory Visit
Admission: RE | Admit: 2019-07-12 | Discharge: 2019-07-12 | Disposition: A | Discharge status: Home / Self Care | Payer: Medicare Other | Source: Ambulatory Visit | Attending: Sports Medicine | Admitting: Sports Medicine

## 2019-07-12 DIAGNOSIS — M25562 Pain in left knee: Secondary | ICD-10-CM

## 2019-07-14 ENCOUNTER — Ambulatory Visit: Payer: Medicare Other | Admitting: Sports Medicine

## 2019-07-14 ENCOUNTER — Other Ambulatory Visit: Payer: Self-pay

## 2019-07-14 VITALS — BP 110/60 | Wt 154.0 lb

## 2019-07-14 DIAGNOSIS — M25562 Pain in left knee: Secondary | ICD-10-CM

## 2019-07-14 NOTE — Progress Notes (Signed)
   Subjective:    Patient ID: Trevor [name], male    DOB: 01/26/1946, 73 y.o.   MRN: [id number]  HPI   Trevor [name] comes in today to discuss MRI findings of his left knee.  He has a little bit of fraying of the lateral meniscus but otherwise the knee is rather unremarkable.  Does have a small joint effusion.  Incidental note of a thickened suprapatellar plica as well.  Trevor [name] has not been running for the past 15 days.  His symptoms are currently minimal.   Review of Systems   As above Objective:   Physical Exam  Well-developed, well-nourished.  No acute distress.  Awake alert and oriented x3.  Vital signs reviewed  Left knee: Full range of motion.  No effusion.  No tenderness to palpation along medial or lateral joint lines.  Negative Thessaly's.  Good quad strength.  Knee is stable to ligamentous exam.  Neurovascularly intact distally.  MRI of the left knee is as above      Assessment & Plan:   Left knee pain likely secondary to degenerative lateral meniscal fraying  Reassurance regarding his MRI.  I think he can resume running as tolerated.  I did recommend that he try some cross training in the pool.  If symptoms return then we could consider a cortisone injection.  Follow-up for ongoing or recalcitrant issues.

## 2019-08-03 ENCOUNTER — Other Ambulatory Visit: Payer: Medicare Other

## 2019-09-11 ENCOUNTER — Other Ambulatory Visit: Payer: Self-pay

## 2019-09-14 ENCOUNTER — Other Ambulatory Visit: Payer: Self-pay | Admitting: Sports Medicine

## 2019-09-14 ENCOUNTER — Ambulatory Visit (INDEPENDENT_AMBULATORY_CARE_PROVIDER_SITE_OTHER): Payer: Medicare Other | Admitting: Family Medicine

## 2019-09-14 ENCOUNTER — Other Ambulatory Visit: Payer: Self-pay

## 2019-09-14 ENCOUNTER — Encounter: Payer: Self-pay | Admitting: Family Medicine

## 2019-09-14 VITALS — BP 124/72 | HR 67 | Temp 97.3°F | Ht 67.0 in | Wt 158.5 lb

## 2019-09-14 DIAGNOSIS — Z125 Encounter for screening for malignant neoplasm of prostate: Secondary | ICD-10-CM

## 2019-09-14 DIAGNOSIS — M5126 Other intervertebral disc displacement, lumbar region: Secondary | ICD-10-CM

## 2019-09-14 DIAGNOSIS — Z0001 Encounter for general adult medical examination with abnormal findings: Secondary | ICD-10-CM

## 2019-09-14 DIAGNOSIS — K219 Gastro-esophageal reflux disease without esophagitis: Secondary | ICD-10-CM | POA: Diagnosis not present

## 2019-09-14 DIAGNOSIS — R739 Hyperglycemia, unspecified: Secondary | ICD-10-CM | POA: Diagnosis not present

## 2019-09-14 DIAGNOSIS — E785 Hyperlipidemia, unspecified: Secondary | ICD-10-CM

## 2019-09-14 DIAGNOSIS — I2510 Atherosclerotic heart disease of native coronary artery without angina pectoris: Secondary | ICD-10-CM

## 2019-09-14 LAB — CBC
HCT: 39 % (ref 39.0–52.0)
Hemoglobin: 13.2 g/dL (ref 13.0–17.0)
MCHC: 34 g/dL (ref 30.0–36.0)
MCV: 93.4 fl (ref 78.0–100.0)
Platelets: 229 10*3/uL (ref 150.0–400.0)
RBC: 4.17 Mil/uL — ABNORMAL LOW (ref 4.22–5.81)
RDW: 13.9 % (ref 11.5–15.5)
WBC: 4.8 10*3/uL (ref 4.0–10.5)

## 2019-09-14 LAB — COMPREHENSIVE METABOLIC PANEL
ALT: 18 U/L (ref 0–53)
AST: 26 U/L (ref 0–37)
Albumin: 4.4 g/dL (ref 3.5–5.2)
Alkaline Phosphatase: 45 U/L (ref 39–117)
BUN: 22 mg/dL (ref 6–23)
CO2: 28 mEq/L (ref 19–32)
Calcium: 9 mg/dL (ref 8.4–10.5)
Chloride: 105 mEq/L (ref 96–112)
Creatinine, Ser: 0.96 mg/dL (ref 0.40–1.50)
GFR: 77.07 mL/min (ref >60.00)
Glucose, Bld: 99 mg/dL (ref 70–99)
Potassium: 4.9 mEq/L (ref 3.5–5.1)
Sodium: 139 mEq/L (ref 135–145)
Total Bilirubin: 1.3 mg/dL — ABNORMAL HIGH (ref 0.2–1.2)
Total Protein: 6.6 g/dL (ref 6.0–8.3)

## 2019-09-14 LAB — HEMOGLOBIN A1C: Hgb A1c MFr Bld: 6 % (ref 4.6–6.5)

## 2019-09-14 LAB — PSA, MEDICARE: PSA: 1.07 ng/ml (ref 0.10–4.00)

## 2019-09-14 LAB — LIPID PANEL
Cholesterol: 173 mg/dL (ref 0–200)
HDL: 49.8 mg/dL (ref >39.00)
LDL Cholesterol: 112 mg/dL — ABNORMAL HIGH (ref 0–99)
NonHDL: 123.16
Total CHOL/HDL Ratio: 3
Triglycerides: 55 mg/dL (ref 0.0–149.0)
VLDL: 11 mg/dL (ref 0.0–40.0)

## 2019-09-14 LAB — TSH: TSH: 2.66 u[IU]/mL (ref 0.35–4.50)

## 2019-09-14 MED ORDER — PANTOPRAZOLE SODIUM 40 MG PO TBEC: 40.0000 mg | DELAYED_RELEASE_TABLET | Freq: Every day | ORAL | 3 refills | Status: DC

## 2019-09-14 NOTE — Patient Instructions (Signed)
It was very nice to see you today!  Keep up the good work!  We will check labs today.  Come back in 1 year for your next physical, or sooner if needed.   Take care, Dr Jerline Pain  Please try these tips to maintain a healthy lifestyle:   Eat at least 3 REAL meals and 1-2 snacks per day.  Aim for no more than 5 hours between eating.  If you eat breakfast, please do so within one hour of getting up.    Obtain twice as many fruits/vegetables as protein or carbohydrate foods for both lunch and dinner. (Half of each meal should be fruits/vegetables, one quarter protein, and one quarter starchy carbs)   Cut down on sweet beverages. This includes juice, soda, and sweet tea.    Exercise at least 150 minutes every week.    Preventive Care 10 Years and Older, Male Preventive care refers to lifestyle choices and visits with your health care provider that can promote health and wellness. This includes:  A yearly physical exam. This is also called an annual well check.  Regular dental and eye exams.  Immunizations.  Screening for certain conditions.  Healthy lifestyle choices, such as diet and exercise. What can I expect for my preventive care visit? Physical exam Your health care provider will check:  Height and weight. These may be used to calculate body mass index (BMI), which is a measurement that tells if you are at a healthy weight.  Heart rate and blood pressure.  Your skin for abnormal spots. Counseling Your health care provider may ask you questions about:  Alcohol, tobacco, and drug use.  Emotional well-being.  Home and relationship well-being.  Sexual activity.  Eating habits.  History of falls.  Memory and ability to understand (cognition).  Work and work Statistician. What immunizations do I need?  Influenza (flu) vaccine  This is recommended every year. Tetanus, diphtheria, and pertussis (Tdap) vaccine  You may need a Td booster every 10 years.  Varicella (chickenpox) vaccine  You may need this vaccine if you have not already been vaccinated. Zoster (shingles) vaccine  You may need this after age 83. Pneumococcal conjugate (PCV13) vaccine  One dose is recommended after age 33. Pneumococcal polysaccharide (PPSV23) vaccine  One dose is recommended after age 86. Measles, mumps, and rubella (MMR) vaccine  You may need at least one dose of MMR if you were born in 1957 or later. You may also need a second dose. Meningococcal conjugate (MenACWY) vaccine  You may need this if you have certain conditions. Hepatitis A vaccine  You may need this if you have certain conditions or if you travel or work in places where you may be exposed to hepatitis A. Hepatitis B vaccine  You may need this if you have certain conditions or if you travel or work in places where you may be exposed to hepatitis B. Haemophilus influenzae type b (Hib) vaccine  You may need this if you have certain conditions. You may receive vaccines as individual doses or as more than one vaccine together in one shot (combination vaccines). Talk with your health care provider about the risks and benefits of combination vaccines. What tests do I need? Blood tests  Lipid and cholesterol levels. These may be checked every 5 years, or more frequently depending on your overall health.  Hepatitis C test.  Hepatitis B test. Screening  Lung cancer screening. You may have this screening every year starting at age 32 if you  have a 30-pack-year history of smoking and currently smoke or have quit within the past 15 years.  Colorectal cancer screening. All adults should have this screening starting at age 7 and continuing until age 33. Your health care provider may recommend screening at age 31 if you are at increased risk. You will have tests every 1-10 years, depending on your results and the type of screening test.  Prostate cancer screening. Recommendations will vary  depending on your family history and other risks.  Diabetes screening. This is done by checking your blood sugar (glucose) after you have not eaten for a while (fasting). You may have this done every 1-3 years.  Abdominal aortic aneurysm (AAA) screening. You may need this if you are a current or former smoker.  Sexually transmitted disease (STD) testing. Follow these instructions at home: Eating and drinking  Eat a diet that includes fresh fruits and vegetables, whole grains, lean protein, and low-fat dairy products. Limit your intake of foods with high amounts of sugar, saturated fats, and salt.  Take vitamin and mineral supplements as recommended by your health care provider.  Do not drink alcohol if your health care provider tells you not to drink.  If you drink alcohol: ? Limit how much you have to 0-2 drinks a day. ? Be aware of how much alcohol is in your drink. In the [place] one drink equals one 12 oz bottle of beer (355 mL), one 5 oz glass of wine (148 mL), or one 1 oz glass of hard liquor (44 mL). Lifestyle  Take daily care of your teeth and gums.  Stay active. Exercise for at least 30 minutes on 5 or more days each week.  Do not use any products that contain nicotine or tobacco, such as cigarettes, e-cigarettes, and chewing tobacco. If you need help quitting, ask your health care provider.  If you are sexually active, practice safe sex. Use a condom or other form of protection to prevent STIs (sexually transmitted infections).  Talk with your health care provider about taking a low-dose aspirin or statin. What's next?  Visit your health care provider once a year for a well check visit.  Ask your health care provider how often you should have your eyes and teeth checked.  Stay up to date on all vaccines. This information is not intended to replace advice given to you by your health care provider. Make sure you discuss any questions you have with your health care  provider. Document Released: 11/11/2015 Document Revised: 10/09/2018 Document Reviewed: 10/09/2018 Elsevier Patient Education  2020 [employer].

## 2019-09-14 NOTE — Assessment & Plan Note (Signed)
Stable.  Continue Protonix 40 mg daily

## 2019-09-14 NOTE — Assessment & Plan Note (Signed)
Stable.  Continue statin and aspirin. 

## 2019-09-14 NOTE — Progress Notes (Signed)
Chief Complaint:  Trevor [name] is a 73 y.o. male who presents today for his annual comprehensive physical exam.    Assessment/Plan:  Gastroesophageal reflux disease without esophagitis Stable.  Continue Protonix 40 mg daily.  CAD (coronary artery disease) Stable.  Continue statin and aspirin.  Hyperlipidemia with target LDL less than 70 Continue Crestor 5 mg 3 times weekly.  Preventative Healthcare: Deferred flu vaccine - will discuss with wife.  Check CBC, C met, TSH, A1c, and lipid panel.  Check PSA.  Up-to-date on colon cancer screening-needs next in 2024.  Advised him to get shingles vaccine soon as well.  Patient Counseling(The following topics were reviewed and/or handout was given):  -Nutrition: Stressed importance of moderation in sodium/caffeine intake, saturated fat and cholesterol, caloric balance, sufficient intake of fresh fruits, vegetables, and fiber.  -Stressed the importance of regular exercise.   -Substance Abuse: Discussed cessation/primary prevention of tobacco, alcohol, or other drug use; driving or other dangerous activities under the influence; availability of treatment for abuse.   -Injury prevention: Discussed safety belts, safety helmets, smoke detector, smoking near bedding or upholstery.   -Sexuality: Discussed sexually transmitted diseases, partner selection, use of condoms, avoidance of unintended pregnancy and contraceptive alternatives.   -Dental health: Discussed importance of regular tooth brushing, flossing, and dental visits.  -Health maintenance and immunizations reviewed. Please refer to Health maintenance section.  Return to care in 1 year for next preventative visit.     Subjective:  HPI:  He has no acute complaints today.   His stable, chronic medical conditions are outlined below:   # Dyslipidemia / CAD - Follows with cardiology - On crestor 22m three times weekly and aspirin 81 mg daily. - ROS: No reported chest [name] or  shortness of breath  # GERD - On protonix 435mdaily and tolerating well - ROS: No reported weight loss or early satiety  # Osteoarthritis - Follows with sports medicine  Lifestyle Diet: No specific diets or eating plans.  Exercise: Runs three times per week.   Depression screen PHQ 2/9 09/14/2019  Decreased Interest 0  Down, Depressed, Hopeless 0  PHQ - 2 Score 0  Altered sleeping 0  Tired, decreased energy 0  Change in appetite 0  Feeling bad or failure about yourself  0  Trouble concentrating 0  Moving slowly or fidgety/restless 0  Suicidal thoughts 0  PHQ-9 Score 0    There are no preventive care reminders to display for this patient.   ROS: Per HPI, otherwise a complete review of systems was negative.   PMH:  The following were reviewed and entered/updated in epic: Past Medical History:  Diagnosis Date  . Acute pericarditis    a. admitted as a code STEMI but no sig CAD on cath.   . CAD (coronary artery disease)    30% stenosis mid RCA  . Colonic polyp    Dr. MeEarlean Shawlhyperplastic polyp  . Gilbert syndrome   . Hyperlipidemia    Patient Active Problem List   Diagnosis Date Noted  . Left leg [name] 02/17/2018  . Bilateral knee [name] 06/18/2017  . Deficiency anemia 09/03/2016  . Gastroesophageal reflux disease without esophagitis 09/03/2016  . Exercise-induced asthma 03/14/2016  . CAD (coronary artery disease)   . Gilbert syndrome 09/06/2014  . History of basal cell cancer 08/31/2013  . Hyperplastic colonic polyp 09/09/2008  . Hyperlipidemia with target LDL less than 70 09/01/2007  . BPH without obstruction/lower urinary tract symptoms 09/01/2007   Past Surgical History:  Procedure Laterality Date  . actinic keratoses     facial removed x 2, Dr Tonia Brooms  . basal cell cancer  2013 & 2015   Dr Crista Luria  . CARDIAC CATHETERIZATION N/A 10/30/2015   Procedure: Left Heart Cath and Coronary Angiography;  Surgeon: Burnell Blanks, MD;  Location: [facility];  Service: Cardiovascular;  Laterality: N/A;  . COLONOSCOPY  2014   negative  . COLONOSCOPY W/ POLYPECTOMY  2004   @ age 23, Dr Earlean Shawl. ? Hyperplastic polyp  . [place]   negative  . KNEE ARTHROSCOPY  1984   right    Family History  Problem Relation Age of Onset  . Stroke Father 78  . Alcohol abuse Father   . Asthma Brother   . Breast cancer Mother   . HIV Brother   . Prostate cancer Brother 60  . Breast cancer Maternal Aunt   . Breast cancer Maternal Grandmother   . Heart disease Neg Hx   . Diabetes Neg Hx     Medications- reviewed and updated Current Outpatient Medications  Medication Sig Dispense Refill  . aspirin 81 MG tablet Take 81 mg by mouth daily.      . Cholecalciferol (VITAMIN D) 2000 UNITS CAPS Take 2,000 Units by mouth daily.     . fish oil-omega-3 fatty acids 1000 MG capsule Take 1 g by mouth daily.     . Magnesium 250 MG TABS Take 250 mg by mouth daily.     . metroNIDAZOLE (METROCREAM) 0.75 % cream Apply 1 application topically daily.    . Multiple Vitamins-Minerals (MENS 50+ MULTI VITAMIN/MIN PO) Take 1 tablet daily by mouth.    . NON FORMULARY     . pantoprazole (PROTONIX) 40 MG tablet Take 1 tablet (40 mg total) by mouth daily. 90 tablet 3  . rosuvastatin (CRESTOR) 5 MG tablet Take 1 tablet (5 mg total) by mouth 3 (three) times a week. 15 tablet 7  . Turmeric (QC TUMERIC COMPLEX PO) Take by mouth.     No current facility-administered medications for this visit.     Allergies-reviewed and updated Allergies  Allergen Reactions  . Codeine Anaphylaxis    Mental status changes  . Pepcid [Famotidine] Shortness Of Breath  . Crestor [Rosuvastatin Calcium] Other (See Comments)    Severe fatigue  . Niacin Other (See Comments)    flushing    Social History   Socioeconomic History  . Marital status: Married    Spouse name: Not on file  . Number of children: Not on file  . Years of education: Not on file  . Highest education  level: Not on file  Occupational History  . Not on file  Social Needs  . Financial resource strain: Not on file  . Food insecurity    Worry: Not on file    Inability: Not on file  . Transportation needs    Medical: Not on file    Non-medical: Not on file  Tobacco Use  . Smoking status: Former Smoker    Packs/day: 1.00    Years: 13.00    Pack years: 13.00    Types: Cigarettes    Quit date: 10/29/1978    Years since quitting: 40.9  . Smokeless tobacco: Never Used  . Tobacco comment: smoked 1967-1980, up to 1 ppd  Substance and Sexual Activity  . Alcohol use: No    Alcohol/week: 0.0 standard drinks  . Drug use: No  . Sexual activity: Not  on file  Lifestyle  . Physical activity    Days per week: Not on file    Minutes per session: Not on file  . Stress: Not on file  Relationships  . Social Herbalist on phone: Not on file    Gets together: Not on file    Attends religious service: Not on file    Active member of club or organization: Not on file    Attends meetings of clubs or organizations: Not on file    Relationship status: Not on file  Other Topics Concern  . Not on file  Social History Narrative   Originally from [place]. Moved to [place] in 1951. He moved to [place] in 1984. Has worked doing Geneticist, molecular. Has also worked for [employer]. Previously has traveled to [place], [place], [place], [place], [place], [place], and [place]. Currently has a cockatiel. No mold exposure. Has a hot tub but doesn't use it regularly.         Objective:  Physical Exam: BP 124/72   Pulse 67   Temp (!) 97.3 F (36.3 C)   Ht 5' 7"  (1.702 m)   Wt 158 lb 8 oz (71.9 kg)   SpO2 96%   BMI 24.82 kg/m   Body mass index is 24.82 kg/m. Wt Readings from Last 3 Encounters:  09/14/19 158 lb 8 oz (71.9 kg)  07/14/19 154 lb (69.9 kg)  07/07/19 142 lb (64.4 kg)   Gen: NAD, resting comfortably HEENT: TMs normal bilaterally. OP clear. No thyromegaly noted.  CV:  RRR with no murmurs appreciated Pulm: NWOB, CTAB with no crackles, wheezes, or rhonchi GI: Normal bowel sounds present. Soft, Nontender, Nondistended. MSK: no edema, cyanosis, or clubbing noted Skin: warm, dry Neuro: CN2-12 grossly intact. Strength 5/5 in upper and lower extremities. Reflexes symmetric and intact bilaterally.  Psych: Normal affect and thought content     Trevor Schlosser M. Jerline Pain, MD 09/14/2019 9:58 AM

## 2019-09-14 NOTE — Assessment & Plan Note (Signed)
Continue Crestor 5 mg 3 times weekly. 

## 2019-09-15 ENCOUNTER — Encounter: Payer: Self-pay | Admitting: Family Medicine

## 2019-09-15 ENCOUNTER — Other Ambulatory Visit: Payer: Self-pay

## 2019-09-15 ENCOUNTER — Encounter: Payer: Medicare Other | Admitting: Adult Health

## 2019-09-15 MED ORDER — ZOSTER VAC RECOMB ADJUVANTED 50 MCG/0.5ML IM SUSR: 0.5000 mL | Freq: Once | INTRAMUSCULAR | 0 refills | Status: AC

## 2019-09-15 NOTE — Progress Notes (Signed)
Please inform patient of the following:  His labs are all STABLE compared to last year. Do not need to make any changes to his treatment plan at this time. Would like for him to continue the good work with his diet and exercise and we can recheck in a year or so.  Trevor [name]. Jerline Pain, MD 09/15/2019 8:24 AM

## 2019-09-16 NOTE — Telephone Encounter (Signed)
See note  Copied from [place] [phone]. Topic: General - Inquiry >> Sep 16, 2019  9:18 AM Mathis Bud wrote: Reason for CRM:  Patient is requesting a call back regarding his labs results Call back [phone]

## 2019-09-16 NOTE — Telephone Encounter (Signed)
See note  Copied from [place] [id number]. Topic: Quick Communication - Office Called Patient (Clinic Use ONLY) >> Sep 16, 2019 11:39 AM Lennox Solders wrote: Reason for CRM: pt is returning [facility] call

## 2019-09-27 ENCOUNTER — Other Ambulatory Visit: Payer: Self-pay | Admitting: Family Medicine

## 2019-09-28 ENCOUNTER — Ambulatory Visit (INDEPENDENT_AMBULATORY_CARE_PROVIDER_SITE_OTHER): Payer: Medicare Other

## 2019-09-28 ENCOUNTER — Other Ambulatory Visit: Payer: Self-pay

## 2019-09-28 DIAGNOSIS — Z0000 Encounter for general adult medical examination without abnormal findings: Secondary | ICD-10-CM | POA: Diagnosis not present

## 2019-09-28 NOTE — Patient Instructions (Signed)
Trevor [name] , Thank you for taking time to come for your Medicare Wellness Visit. I appreciate your ongoing commitment to your health goals. Please review the following plan we discussed and let me know if I can assist you in the future.   Screening recommendations/referrals: Colorectal Screening: up to date; last 2014 with Dr. Earlean Shawl  Vision and Dental Exams: Recommended annual ophthalmology exams for early detection of glaucoma and other disorders of the eye Recommended annual dental exams for proper oral hygiene  Vaccinations: Influenza vaccine:  recommended this fall either at PCP office or through your local pharmacy  Pneumococcal vaccine: up to date; last 06/13/17  Tdap vaccine: recommended; Please call your insurance company to determine your out of pocket expense. You may receive this vaccine at your local pharmacy or Health Dept. Shingles vaccine: Please complete series of 2 Shingrix injections   Advanced directives: Please bring a copy of your POA (Power of Attorney) and/or Living Will to your next appointment.  Goals: Recommend to drink at least 6-8 8oz glasses of water per day and consume a balanced diet rich in fresh fruits and vegetables.   Next appointment: Please schedule your Annual Wellness Visit with your Nurse Health Advisor in one year.  Preventive Care 11 Years and Older, Male Preventive care refers to lifestyle choices and visits with your health care provider that can promote health and wellness. What does preventive care include?  A yearly physical exam. This is also called an annual well check.  Dental exams once or twice a year.  Routine eye exams. Ask your health care provider how often you should have your eyes checked.  Personal lifestyle choices, including:  Daily care of your teeth and gums.  Regular physical activity.  Eating a healthy diet.  Avoiding tobacco and drug use.  Limiting alcohol use.  Practicing safe sex.  Taking low doses of  aspirin every day if recommended by your health care provider..  Taking vitamin and mineral supplements as recommended by your health care provider. What happens during an annual well check? The services and screenings done by your health care provider during your annual well check will depend on your age, overall health, lifestyle risk factors, and family history of disease. Counseling  Your health care provider may ask you questions about your:  Alcohol use.  Tobacco use.  Drug use.  Emotional well-being.  Home and relationship well-being.  Sexual activity.  Eating habits.  History of falls.  Memory and ability to understand (cognition).  Work and work Statistician. Screening  You may have the following tests or measurements:  Height, weight, and BMI.  Blood pressure.  Lipid and cholesterol levels. These may be checked every 5 years, or more frequently if you are over 52 years old.  Skin check.  Lung cancer screening. You may have this screening every year starting at age 26 if you have a 30-pack-year history of smoking and currently smoke or have quit within the past 15 years.  Fecal occult blood test (FOBT) of the stool. You may have this test every year starting at age 41.  Flexible sigmoidoscopy or colonoscopy. You may have a sigmoidoscopy every 5 years or a colonoscopy every 10 years starting at age 68.  Prostate cancer screening. Recommendations will vary depending on your family history and other risks.  Hepatitis C blood test.  Hepatitis B blood test.  Sexually transmitted disease (STD) testing.  Diabetes screening. This is done by checking your blood sugar (glucose) after you have  not eaten for a while (fasting). You may have this done every 1-3 years.  Abdominal aortic aneurysm (AAA) screening. You may need this if you are a current or former smoker.  Osteoporosis. You may be screened starting at age 50 if you are at high risk. Talk with your health  care provider about your test results, treatment options, and if necessary, the need for more tests. Vaccines  Your health care provider may recommend certain vaccines, such as:  Influenza vaccine. This is recommended every year.  Tetanus, diphtheria, and acellular pertussis (Tdap, Td) vaccine. You may need a Td booster every 10 years.  Zoster vaccine. You may need this after age 76.  Pneumococcal 13-valent conjugate (PCV13) vaccine. One dose is recommended after age 63.  Pneumococcal polysaccharide (PPSV23) vaccine. One dose is recommended after age 34. Talk to your health care provider about which screenings and vaccines you need and how often you need them. This information is not intended to replace advice given to you by your health care provider. Make sure you discuss any questions you have with your health care provider. Document Released: 11/11/2015 Document Revised: 07/04/2016 Document Reviewed: 08/16/2015 Elsevier Interactive Patient Education  2017 [place] Prevention in the Home Falls can cause injuries. They can happen to people of all ages. There are many things you can do to make your home safe and to help prevent falls. What can I do on the outside of my home?  Regularly fix the edges of walkways and driveways and fix any cracks.  Remove anything that might make you trip as you walk through a door, such as a raised step or threshold.  Trim any bushes or trees on the path to your home.  Use bright outdoor lighting.  Clear any walking paths of anything that might make someone trip, such as rocks or tools.  Regularly check to see if handrails are loose or broken. Make sure that both sides of any steps have handrails.  Any raised decks and porches should have guardrails on the edges.  Have any leaves, snow, or ice cleared regularly.  Use sand or salt on walking paths during winter.  Clean up any spills in your garage right away. This includes oil or  grease spills. What can I do in the bathroom?  Use night lights.  Install grab bars by the toilet and in the tub and shower. Do not use towel bars as grab bars.  Use non-skid mats or decals in the tub or shower.  If you need to sit down in the shower, use a plastic, non-slip stool.  Keep the floor dry. Clean up any water that spills on the floor as soon as it happens.  Remove soap buildup in the tub or shower regularly.  Attach bath mats securely with double-sided non-slip rug tape.  Do not have throw rugs and other things on the floor that can make you trip. What can I do in the bedroom?  Use night lights.  Make sure that you have a light by your bed that is easy to reach.  Do not use any sheets or blankets that are too big for your bed. They should not hang down onto the floor.  Have a firm chair that has side arms. You can use this for support while you get dressed.  Do not have throw rugs and other things on the floor that can make you trip. What can I do in the kitchen?  Clean up any  spills right away.  Avoid walking on wet floors.  Keep items that you use a lot in easy-to-reach places.  If you need to reach something above you, use a strong step stool that has a grab bar.  Keep electrical cords out of the way.  Do not use floor polish or wax that makes floors slippery. If you must use wax, use non-skid floor wax.  Do not have throw rugs and other things on the floor that can make you trip. What can I do with my stairs?  Do not leave any items on the stairs.  Make sure that there are handrails on both sides of the stairs and use them. Fix handrails that are broken or loose. Make sure that handrails are as long as the stairways.  Check any carpeting to make sure that it is firmly attached to the stairs. Fix any carpet that is loose or worn.  Avoid having throw rugs at the top or bottom of the stairs. If you do have throw rugs, attach them to the floor with  carpet tape.  Make sure that you have a light switch at the top of the stairs and the bottom of the stairs. If you do not have them, ask someone to add them for you. What else can I do to help prevent falls?  Wear shoes that:  Do not have high heels.  Have rubber bottoms.  Are comfortable and fit you well.  Are closed at the toe. Do not wear sandals.  If you use a stepladder:  Make sure that it is fully opened. Do not climb a closed stepladder.  Make sure that both sides of the stepladder are locked into place.  Ask someone to hold it for you, if possible.  Clearly mark and make sure that you can see:  Any grab bars or handrails.  First and last steps.  Where the edge of each step is.  Use tools that help you move around (mobility aids) if they are needed. These include:  Canes.  Walkers.  Scooters.  Crutches.  Turn on the lights when you go into a dark area. Replace any light bulbs as soon as they burn out.  Set up your furniture so you have a clear path. Avoid moving your furniture around.  If any of your floors are uneven, fix them.  If there are any pets around you, be aware of where they are.  Review your medicines with your doctor. Some medicines can make you feel dizzy. This can increase your chance of falling. Ask your doctor what other things that you can do to help prevent falls. This information is not intended to replace advice given to you by your health care provider. Make sure you discuss any questions you have with your health care provider. Document Released: 08/11/2009 Document Revised: 03/22/2016 Document Reviewed: 11/19/2014 Elsevier Interactive Patient Education  2017 [employer].

## 2019-09-28 NOTE — Progress Notes (Signed)
This visit is being conducted via phone call due to the COVID-19 pandemic. This patient has given me verbal consent via phone to conduct this visit, patient states they are participating from their home address. Some vital signs may be absent or patient reported.   Patient identification: identified by name, DOB, and current address.  Location provider: [facility], Office Persons participating in the virtual visit: Kandis Fantasia LPN and Dr. Jacquiline Doe   Subjective:   Trevor [name] is a 73 y.o. male who presents for an Initial Medicare Annual Wellness Visit.  Review of Systems   Cardiac Risk Factors include: advanced age (>14men, >63 women);male gender;dyslipidemia   Objective:    There were no vitals filed for this visit. There is no height or weight on file to calculate BMI.  Advanced Directives 09/28/2019 09/22/2018 09/11/2017 07/03/2017 09/26/2015 09/05/2015 11/02/2014  Does Patient Have a Medical Advance Directive? Yes Yes Yes No No No No  Type of Advance Directive Living will;Healthcare Power of Attorney - - - - - -  Does patient want to make changes to medical advance directive? No - Patient declined - - - - - -  Copy of Healthcare Power of Attorney in Chart? No - copy requested - - - - - -  Would patient like information on creating a medical advance directive? - - - - No - patient declined information No - patient declined information No - patient declined information    Current Medications (verified) Outpatient Encounter Medications as of 09/28/2019  Medication Sig  . aspirin 81 MG tablet Take 81 mg by mouth daily.    . Cholecalciferol (VITAMIN D) 2000 UNITS CAPS Take 2,000 Units by mouth daily.   . fish oil-omega-3 fatty acids 1000 MG capsule Take 1 g by mouth daily.   . Magnesium 250 MG TABS Take 250 mg by mouth daily.   . metroNIDAZOLE (METROCREAM) 0.75 % cream Apply 1 application topically daily.  . Multiple Vitamins-Minerals (MENS 50+ MULTI VITAMIN/MIN PO)  Take 1 tablet daily by mouth.  . NON FORMULARY   . pantoprazole (PROTONIX) 40 MG tablet TAKE 1 TABLET(40 MG) BY MOUTH DAILY  . rosuvastatin (CRESTOR) 5 MG tablet Take 1 tablet (5 mg total) by mouth 3 (three) times a week.  . Turmeric (QC TUMERIC COMPLEX PO) Take by mouth.  . [DISCONTINUED] pantoprazole (PROTONIX) 40 MG tablet Take 1 tablet (40 mg total) by mouth daily.   No facility-administered encounter medications on file as of 09/28/2019.     Allergies (verified) Codeine, Pepcid [famotidine], Crestor [rosuvastatin calcium], and Niacin   History: Past Medical History:  Diagnosis Date  . Acute pericarditis    a. admitted as a code STEMI but no sig CAD on cath.   . CAD (coronary artery disease)    30% stenosis mid RCA  . Colonic polyp    Dr. Kinnie Scales; hyperplastic polyp  . Gilbert syndrome   . Hyperlipidemia    Past Surgical History:  Procedure Laterality Date  . actinic keratoses     facial removed x 2, Dr Danella Deis  . basal cell cancer  2013 & 2015   Dr Campbell Stall  . CARDIAC CATHETERIZATION N/A 10/30/2015   Procedure: Left Heart Cath and Coronary Angiography;  Surgeon: Kathleene Hazel, MD;  Location: [facility] INVASIVE CV LAB;  Service: Cardiovascular;  Laterality: N/A;  . COLONOSCOPY  2014   negative  . COLONOSCOPY W/ POLYPECTOMY  2004   @ age 33, Dr Kinnie Scales. ? Hyperplastic polyp  .  FLEXIBLE SIGMOIDOSCOPY  1999   negative  . KNEE ARTHROSCOPY  1984   right   Family History  Problem Relation Age of Onset  . Stroke Father 261  . Alcohol abuse Father   . Asthma Brother   . Breast cancer Mother   . HIV Brother   . Prostate cancer Brother [id number]  . Breast cancer Maternal Aunt   . Breast cancer Maternal Grandmother   . Heart disease Neg Hx   . Diabetes Neg Hx    Social History   Socioeconomic History  . Marital status: Married    Spouse name: Not on file  . Number of children: Not on file  . Years of education: Not on file  . Highest education level: Not on file   Occupational History  . Not on file  Social Needs  . Financial resource strain: Not on file  . Food insecurity    Worry: Not on file    Inability: Not on file  . Transportation needs    Medical: Not on file    Non-medical: Not on file  Tobacco Use  . Smoking status: Former Smoker    Packs/day: 1.00    Years: 13.00    Pack years: 13.00    Types: Cigarettes    Quit date: 10/29/1978    Years since quitting: 40.9  . Smokeless tobacco: Never Used  . Tobacco comment: smoked 1967-1980, up to 1 ppd  Substance and Sexual Activity  . Alcohol use: No    Alcohol/week: 0.0 standard drinks  . Drug use: No  . Sexual activity: Not on file  Lifestyle  . Physical activity    Days per week: Not on file    Minutes per session: Not on file  . Stress: Not on file  Relationships  . Social Musicianconnections    Talks on phone: Not on file    Gets together: Not on file    Attends religious service: Not on file    Active member of club or organization: Not on file    Attends meetings of clubs or organizations: Not on file    Relationship status: Not on file  Other Topics Concern  . Not on file  Social History Narrative   Originally from [place]. Moved to [place] in 1951. He moved to [place] in 1984. 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 Previously has traveled to [place], [place], [place], [place], [place], [place] and [place]. Normally has a cockatiel but not currently No mold exposure. Has a hot tub but doesn't use it regularly. No children    Tobacco Counseling Counseling given: Not Answered Comment: smoked 1967-1980, up to 1 ppd   Clinical Intake:  Pre-visit preparation completed: Yes  Pain : No/denies pain  Diabetes: No  How often do you need to have someone help you when you read instructions, pamphlets, or other written materials from your doctor or pharmacy?: 1 - Never  Interpreter Needed?: No  Information entered by :: Kandis Fantasiaourtney Isha Seefeld LPN   Activities of Daily Living In your present state of health, do you have any difficulty performing the following activities: 09/28/2019  Hearing? N  Vision? N  Difficulty concentrating or making decisions? N  Walking or climbing stairs? N  Dressing or bathing? N  Doing errands, shopping? N  Preparing Food and eating ? N  Using the Toilet? N  In the past six months, have you accidently leaked urine? N  Do you have problems with loss  of bowel control? N  Managing your Medications? N  Managing your Finances? N  Housekeeping or managing your Housekeeping? N  Some recent data might be hidden     Immunizations and Health Maintenance Immunization History  Administered Date(s) Administered  . Pneumococcal Conjugate-13 10/21/2015  . Pneumococcal Polysaccharide-23 06/13/2017  . Td 09/09/2008  . Zoster 09/17/2014  . Zoster Recombinat (Shingrix) 09/21/2019   Health Maintenance Due  Topic Date Due  . Samul Dada  09/09/2018    Patient Care Team: Vivi Barrack, MD as PCP - General (Family Medicine) Burnell Blanks, MD as PCP - Cardiology (Cardiology) Phylliss Bob, MD as Consulting Physician (Orthopedic Surgery)  Indicate any recent Medical Services you may have received from other than [facility] providers in the past year (date may be approximate).    Assessment:   This is a routine wellness examination for Takahiro.  Hearing/Vision screen No exam data present  Dietary issues and exercise activities discussed: Current Exercise Habits: Home exercise routine, Type of exercise: Other - see comments;strength training/weights(running), Time (Minutes): 60, Frequency (Times/Week): 5, Weekly Exercise (Minutes/Week): 300, Intensity: Moderate  Goals    . Exercise 150 min/wk Moderate Activity     Keep running and enjoy your exercise routine     . patient      Look for avenues of growth in other areas; teaching etc       Depression Screen PHQ 2/9 Scores 09/14/2019 09/22/2018  09/11/2017 07/03/2017  PHQ - 2 Score 0 0 0 -  PHQ- 9 Score 0 - - -  Exception Documentation - - - Other- indicate reason in comment box    Fall Risk Fall Risk  09/28/2019 09/14/2019 09/22/2018 09/11/2017 07/03/2017  Falls in the past year? 0 0 0 No No  Injury with Fall? 0 - - - -  Risk for fall due to : - - - - Other (Comment)  Follow up Falls evaluation completed;Falls prevention discussed;Education provided - - - -    Is the patient's home free of loose throw rugs in walkways, pet beds, electrical cords, etc?   yes      Grab bars in the bathroom? yes      Handrails on the stairs?   yes      Adequate lighting?   yes  Cognitive Function: no cognitive concerns at this time  Cognitive Testing  Alert? Yes         Normal Appearance? n/a Oriented to person? Yes           Place? Yes  Time? Yes  Recall of three objects? Yes  Can perform simple calculations? Yes  Displays appropriate judgment? Yes  Can read the correct time from a watch face? Yes   MMSE - Mini Mental State Exam 09/22/2018 09/11/2017  Not completed: (No Data) (No Data)        Screening Tests Health Maintenance  Topic Date Due  . TETANUS/TDAP  09/09/2018  . INFLUENZA VACCINE  01/27/2020 (Originally 05/30/2019)  . COLONOSCOPY  08/30/2023 (Originally 08/29/2018)  . Hepatitis C Screening  Completed  . PNA vac Low Risk Adult  Completed    Qualifies for Shingles Vaccine? In process of completing vaccine   Cancer Screenings: Lung: Low Dose CT Chest recommended if Age 45-80 years, 30 pack-year currently smoking OR have quit w/in 15years. Patient does not qualify. Colorectal: colonoscopy 2014 with Dr. Earlean Shawl; repeat in 10 years     Plan:  I have personally reviewed and addressed the Medicare Annual Wellness questionnaire and  have noted the following in the patient's chart:  A. Medical and social history B. Use of alcohol, tobacco or illicit drugs  C. Current medications and supplements D. Functional ability and status  E.  Nutritional status F.  Physical activity G. Advance directives H. List of other physicians I.  Hospitalizations, surgeries, and ER visits in previous 12 months J.  Vitals K. Screenings such as hearing and vision if needed, cognitive and depression L. Referrals, records requested, and appointments- none   In addition, I have reviewed and discussed with patient certain preventive protocols, quality metrics, and best practice recommendations. A written personalized care plan for preventive services as well as general preventive health recommendations were provided to patient.   Signed,  Kandis Fantasia, LPN  Nurse Health Advisor   Nurse Notes: no additional

## 2019-09-29 NOTE — Progress Notes (Signed)
I have personally reviewed the Medicare Annual Wellness Visit and agree with the assessment and plan.  Caleb M. Parker, MD 09/29/2019 7:50 AM   

## 2019-10-12 ENCOUNTER — Telehealth: Payer: Self-pay | Admitting: Cardiovascular Disease

## 2019-10-12 NOTE — Telephone Encounter (Signed)
Patient calling because he is due for a 2 year f/u with Dr. Angelena Form in February. He is aware that Dr. Angelena Form does not have anything available at this time. He is asking to speak with [name] in regards to doing a virtual visit. I asked if he was willing to see a PA and he states he would rather see Dr. Angelena Form. Told him I would give [name] the message.

## 2019-10-16 ENCOUNTER — Ambulatory Visit (INDEPENDENT_AMBULATORY_CARE_PROVIDER_SITE_OTHER): Payer: Medicare Other | Admitting: Sports Medicine

## 2019-10-16 ENCOUNTER — Encounter: Payer: Self-pay | Admitting: Sports Medicine

## 2019-10-16 ENCOUNTER — Other Ambulatory Visit: Payer: Self-pay

## 2019-10-16 ENCOUNTER — Ambulatory Visit
Admission: RE | Admit: 2019-10-16 | Discharge: 2019-10-16 | Disposition: A | Discharge status: Home / Self Care | Payer: Medicare Other | Source: Ambulatory Visit | Attending: Sports Medicine | Admitting: Sports Medicine

## 2019-10-16 VITALS — BP 138/70 | Ht 67.0 in | Wt 154.0 lb

## 2019-10-16 DIAGNOSIS — M222X2 Patellofemoral disorders, left knee: Secondary | ICD-10-CM | POA: Diagnosis not present

## 2019-10-16 DIAGNOSIS — M5126 Other intervertebral disc displacement, lumbar region: Secondary | ICD-10-CM

## 2019-10-16 MED ORDER — IOPAMIDOL (ISOVUE-M 200) INJECTION 41%: 1.0000 mL | Freq: Once | INTRAMUSCULAR | Status: DC

## 2019-10-16 MED ORDER — METHYLPREDNISOLONE ACETATE 40 MG/ML IJ SUSP
40.0000 mg | Freq: Once | INTRAMUSCULAR | Status: AC
  Administered 2019-10-16: 40 mg via INTRA_ARTICULAR

## 2019-10-16 MED ORDER — METHYLPREDNISOLONE ACETATE 40 MG/ML INJ SUSP (RADIOLOG: 120.0000 mg | Freq: Once | INTRAMUSCULAR | Status: DC

## 2019-10-16 NOTE — Progress Notes (Signed)
[facility] [address] [place], [place] [address] Phone: [phone] Fax: [phone]   Patient Name: Trevor [name] Date of Birth: 1946-04-06 Medical Record Number: [id number] Gender: male Date of Encounter: 10/16/2019  CC: Left Knee pain  HPI: Pt is here for follow-up of left knee pain. Pain started this summer. Aggravating factors include getting in and out of a car. Alleviating factors include rest. No radiating symptoms. Has diligently done multiple months of a home exercise program. Has had a MRI this year. He is able to run pain-free, but will still get pain with mild locking on the outside of his knee. He is interested in a injection into his knee today.   Past Medical History:  Diagnosis Date  . Acute pericarditis    a. admitted as a code STEMI but no sig CAD on cath.   . CAD (coronary artery disease)    30% stenosis mid RCA  . Colonic polyp    Dr. Kinnie Scales; hyperplastic polyp  . Gilbert syndrome   . Hyperlipidemia     Current Outpatient Medications on File Prior to Visit  Medication Sig Dispense Refill  . aspirin 81 MG tablet Take 81 mg by mouth daily.      . Cholecalciferol (VITAMIN D) 2000 UNITS CAPS Take 2,000 Units by mouth daily.     . fish oil-omega-3 fatty acids 1000 MG capsule Take 1 g by mouth daily.     . Magnesium 250 MG TABS Take 250 mg by mouth daily.     . metroNIDAZOLE (METROCREAM) 0.75 % cream Apply 1 application topically daily.    . Multiple Vitamins-Minerals (MENS 50+ MULTI VITAMIN/MIN PO) Take 1 tablet daily by mouth.    . NON FORMULARY     . pantoprazole (PROTONIX) 40 MG tablet TAKE 1 TABLET(40 MG) BY MOUTH DAILY 90 tablet 3  . rosuvastatin (CRESTOR) 5 MG tablet Take 1 tablet (5 mg total) by mouth 3 (three) times a week. 15 tablet 7  . Turmeric (QC TUMERIC COMPLEX PO) Take by mouth.     Current Facility-Administered Medications on File Prior to Visit  Medication Dose Route Frequency Provider Last  Rate Last Admin  . iopamidol (ISOVUE-M) 41 % intrathecal injection 1 mL  1 mL Epidural Once Reino Bellis R, DO      . methylPREDNISolone acetate (DEPO-MEDROL) injection (RADIOLOGY ONLY) 120 mg  120 mg Epidural Once Ralene Cork, DO        Past Surgical History:  Procedure Laterality Date  . actinic keratoses     facial removed x 2, Dr Danella Deis  . basal cell cancer  2013 & 2015   Dr Campbell Stall  . CARDIAC CATHETERIZATION N/A 10/30/2015   Procedure: Left Heart Cath and Coronary Angiography;  Surgeon: Kathleene Hazel, MD;  Location: [facility] INVASIVE CV LAB;  Service: Cardiovascular;  Laterality: N/A;  . COLONOSCOPY  2014   negative  . COLONOSCOPY W/ POLYPECTOMY  2004   @ age 29, Dr Kinnie Scales. ? Hyperplastic polyp  . FLEXIBLE SIGMOIDOSCOPY  1999   negative  . KNEE ARTHROSCOPY  1984   right    Allergies  Allergen Reactions  . Codeine Anaphylaxis    Mental status changes  . Crestor [Rosuvastatin Calcium] Other (See Comments)    Severe fatigue  . Niacin Other (See Comments)    flushing    Social History   Socioeconomic History  . Marital status: Married    Spouse name: Not on file  . Number  of children: Not on file  . Years of education: Not on file  . Highest education level: Not on file  Occupational History  . Not on file  Tobacco Use  . Smoking status: Former Smoker    Packs/day: 1.00    Years: 13.00    Pack years: 13.00    Types: Cigarettes    Quit date: 10/29/1978    Years since quitting: 40.9  . Smokeless tobacco: Never Used  . Tobacco comment: smoked 1967-1980, up to 1 ppd  Substance and Sexual Activity  . Alcohol use: No    Alcohol/week: 0.0 standard drinks  . Drug use: No  . Sexual activity: Not on file  Other Topics Concern  . Not on file  Social History Narrative   Originally from [place]. Moved to [place] in 1951. He moved to [place] in 1984. 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 Previously has traveled to  [place], [place], [place], [place], [place], [place] and [place]. Normally has a cockatiel but not currently No mold exposure. Has a hot tub but doesn't use it regularly. No children    Social Determinants of Health   Financial Resource Strain:   . Difficulty of Paying Living Expenses: Not on file  Food Insecurity:   . Worried About Programme researcher, broadcasting/film/videounning Out of Food in the Last Year: Not on file  . Ran Out of Food in the Last Year: Not on file  Transportation Needs:   . Lack of Transportation (Medical): Not on file  . Lack of Transportation (Non-Medical): Not on file  Physical Activity:   . Days of Exercise per Week: Not on file  . Minutes of Exercise per Session: Not on file  Stress:   . Feeling of Stress : Not on file  Social Connections:   . Frequency of Communication with Friends and Family: Not on file  . Frequency of Social Gatherings with Friends and Family: Not on file  . Attends Religious Services: Not on file  . Active Member of Clubs or Organizations: Not on file  . Attends BankerClub or Organization Meetings: Not on file  . Marital Status: Not on file  Intimate Partner Violence:   . Fear of Current or Ex-Partner: Not on file  . Emotionally Abused: Not on file  . Physically Abused: Not on file  . Sexually Abused: Not on file    Family History  Problem Relation Age of Onset  . Stroke Father 2261  . Alcohol abuse Father   . Asthma Brother   . Breast cancer Mother   . HIV Brother   . Prostate cancer Brother [id number]  . Breast cancer Maternal Aunt   . Breast cancer Maternal Grandmother   . Heart disease Neg Hx   . Diabetes Neg Hx     BP 138/70   Ht 5\' 7"  (1.702 m)   Wt 154 lb (69.9 kg)   BMI 24.12 kg/m   ROS:  See HPI CONST: no F/C, no malaise, no fatigue MSK: See above NEURO: no numbness/tingling, no weakness SKIN: no rash, no lesions HEME: no bleeding, no bruising, no erythema  Objective: GEN: Alert and oriented, NAD Pulm: Breathing unlabored PSY: normal mood, congruent  affect  Left knee: Normal to inspection with no erythema or effusion or obvious bony abnormalities. Mild lateral joint line TTP Pain beyond 100 degrees of knee flexion Ligaments with solid consistent endpoints including ACL, PCL, LCL, MCL. Positive Thessaly Non painful patellar compression. Patellar glide without crepitus. Patellar  and quadriceps tendons unremarkable. Hamstring and quadriceps strength is normal.  Neurovascularly intact.  Procedure  After informed written consent timeout was performed, patient was seated on exam table. Left knee was prepped with alcohol swab and utilizing anteromedial approach, patient's left knee was injected intraarticularly with 3:1 bupivicaine: depomedrol. Patient tolerated the procedure well without immediate complications.   Assessment and Plan:  1. Chronic left knee pain  Successful injection as above. Explained to the patient that it is still important to continue home exercise program as more of a preventative measure. I expect him to do well over the coming months. He will follow-up in 4-6 weeks if symptoms persist.   Lanier Clam, DO, ATC Sports Medicine Fellow

## 2019-10-16 NOTE — Telephone Encounter (Signed)
Left message for patient to call back. He can be scheduled on 12/24/19 at 2:20 pm with Dr. Angelena Form.

## 2019-11-13 NOTE — Telephone Encounter (Signed)
Left detailed message to call back if he would like to move up the April appointment to Feb 25. Also adv on VM that I am sending a mychart message to him, and he can also just reply to the message as to whether he wants to change the appointment From April to Feb 25.  I will not change appointment until hearing back from him.

## 2019-11-22 ENCOUNTER — Encounter: Payer: Self-pay | Admitting: Family Medicine

## 2019-12-10 ENCOUNTER — Encounter: Payer: Self-pay | Admitting: Family Medicine

## 2020-01-28 NOTE — Progress Notes (Signed)
Chief Complaint  Patient presents with   Follow-up    CAD    History of Present Illness: 74 yo male with history of HLD, CAD, pericarditis here today for follow up. He was admitted to [facility] in January 2017 with chest pain and acute ST changes. Emergent cardiac cath with mild disease mid RCA but no flow limiting lesions. Echo 10/31/15 with normal LV systolic function, moderate LVH, trivial AI. He was treated with an NSAID for possible acute pericarditis. He has not tolerated high dose statins but has tolerated low dose Crestor recently.   He is here today for follow up. The patient denies any chest pain, dyspnea, palpitations, lower extremity edema, orthopnea, PND, dizziness, near syncope or syncope.    Primary Care Physician: Ardith Dark, MD   Past Medical History:  Diagnosis Date   Acute pericarditis    a. admitted as a code STEMI but no sig CAD on cath.    CAD (coronary artery disease)    30% stenosis mid RCA   Colonic polyp    Dr. Kinnie Scales; hyperplastic polyp   [name]    Hyperlipidemia     Past Surgical History:  Procedure Laterality Date   actinic keratoses     facial removed x 2, Dr Danella Deis   basal cell cancer  2013 & 2015   Dr Campbell Stall   CARDIAC CATHETERIZATION N/A 10/30/2015   Procedure: Left Heart Cath and Coronary Angiography;  Surgeon: Kathleene Hazel, MD;  Location: [facility] INVASIVE CV LAB;  Service: Cardiovascular;  Laterality: N/A;   COLONOSCOPY  2014   negative   COLONOSCOPY W/ POLYPECTOMY  2004   @ age 4, Dr Kinnie Scales. ? Hyperplastic polyp   FLEXIBLE SIGMOIDOSCOPY  1999   negative   KNEE ARTHROSCOPY  1984   right    Current Outpatient Medications  Medication Sig Dispense Refill   Ascorbic Acid (VITA-C PO) Take 1 tablet by mouth daily.     aspirin 81 MG tablet Take 81 mg by mouth daily.       Cholecalciferol (VITAMIN D) 2000 UNITS CAPS Take 2,000 Units by mouth daily.      fish oil-omega-3 fatty acids 1000 MG capsule Take 1  g by mouth daily.      Magnesium 250 MG TABS Take 250 mg by mouth daily.      metroNIDAZOLE (METROCREAM) 0.75 % cream Apply 1 application topically daily.     Multiple Vitamins-Minerals (MENS 50+ MULTI VITAMIN/MIN PO) Take 1 tablet daily by mouth.     NON FORMULARY      pantoprazole (PROTONIX) 40 MG tablet TAKE 1 TABLET(40 MG) BY MOUTH DAILY 90 tablet 3   rosuvastatin (CRESTOR) 5 MG tablet Take 1 tablet (5 mg total) by mouth 3 (three) times a week. 15 tablet 7   Turmeric (QC TUMERIC COMPLEX PO) Take by mouth.     Zinc 25 MG TABS Take 1 tablet by mouth daily.     No current facility-administered medications for this visit.    Allergies  Allergen Reactions   Codeine Anaphylaxis    Mental status changes   Crestor [Rosuvastatin Calcium] Other (See Comments)    Severe fatigue   Niacin Other (See Comments)    flushing    Social History   Socioeconomic History   Marital status: Married    Spouse name: Not on file   Number of children: Not on file   Years of education: Not on file   Highest education level: Not  on file  Occupational History   Not on file  Tobacco Use   Smoking status: Former Smoker    Packs/day: 1.00    Years: 13.00    Pack years: 13.00    Types: Cigarettes    Quit date: 10/29/1978    Years since quitting: 41.2   Smokeless tobacco: Never Used   Tobacco comment: smoked 1967-1980, up to 1 ppd  Substance and Sexual Activity   Alcohol use: No    Alcohol/week: 0.0 standard drinks   Drug use: No   Sexual activity: Not on file  Other Topics Concern   Not on file  Social History Narrative   Originally from [place]. Moved to [place] in [place]. He moved to [place] in 1984. 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 Previously has traveled to [place], [place], [place], [place], [place], and [place]. Normally has a cockatiel but not currently No mold exposure. Has a hot tub but doesn't use it regularly.  No children    Social Determinants of Corporate investment banker Strain:    Difficulty of Paying Living Expenses:   Food Insecurity:    Worried About Programme researcher, broadcasting/film/video in the Last Year:    Barista in the Last Year:   Transportation Needs:    Freight forwarder (Medical):    Lack of Transportation (Non-Medical):   Physical Activity:    Days of Exercise per Week:    Minutes of Exercise per Session:   Stress:    Feeling of Stress :   Social Connections:    Frequency of Communication with Friends and Family:    Frequency of Social Gatherings with Friends and Family:    Attends Religious Services:    Active Member of Clubs or Organizations:    Attends Engineer, structural:    Marital Status:   Intimate Partner Violence:    Fear of Current or Ex-Partner:    Emotionally Abused:    Physically Abused:    Sexually Abused:     Family History  Problem Relation Age of Onset   Stroke Father 14   Alcohol abuse Father    Asthma Brother    Breast cancer Mother    HIV Brother    Prostate cancer Brother 14   Breast cancer Maternal Aunt    Breast cancer Maternal Grandmother    Heart disease Neg Hx    Diabetes Neg Hx     Review of Systems:  As stated in the HPI and otherwise negative.   BP 124/74    Pulse 61    Ht 5\' 7"  (1.702 m)    Wt 154 lb (69.9 kg)    SpO2 97%    BMI 24.12 kg/m   Physical Examination: General: Well developed, well nourished, NAD  HEENT: OP clear, mucus membranes moist  SKIN: warm, dry. No rashes. Neuro: No focal deficits  Musculoskeletal: Muscle strength 5/5 all ext  Psychiatric: Mood and affect normal  Neck: No JVD, no carotid bruits, no thyromegaly, no lymphadenopathy.  Lungs:Clear bilaterally, no wheezes, rhonci, crackles Cardiovascular: Regular rate and rhythm. No murmurs, gallops or rubs. Abdomen:Soft. Bowel sounds present. Non-tender.  Extremities: No lower extremity edema. Pulses are 2 + in the  bilateral DP/PT.  Cardiac cath 10/30/15: Coronary Findings    Dominance: Right   Left Anterior Descending  . Vessel is large. Vessel is angiographically normal.    First Diagonal Branch   The vessel is moderate  in size and is angiographically normal.    Second Diagonal Branch   The vessel is small in size.     Left Circumflex  . Vessel is moderate in size. Vessel is angiographically normal.    First Obtuse Marginal Branch   The vessel is moderate in size and is angiographically normal.     Right Coronary Artery  . Vessel is large.    Mid RCA lesion, 30% stenosed. Discrete.    Right Posterior Descending Artery   The vessel is moderate in size.      Wall Motion                 Coronary Diagrams    Diagnostic Diagram         Echo 10/31/15: Left ventricle: The cavity size was normal. Wall thickness was increased in a pattern of moderate LVH. Systolic function was normal. The estimated ejection fraction was in the range of 60% to 65%. GLPSS -19% - normal. Wall motion was normal; there were no regional wall motion abnormalities. Doppler parameters are consistent with abnormal left ventricular relaxation (grade 1 diastolic dysfunction). The E/e&' ratio is <8, suggesting normal LV filling pressure. - Aortic valve: Trileaflet. Sclerosis without stenosis. There was trivial regurgitation. - Mitral valve: Mildly thickened leaflets . - Left atrium: The atrium was normal in size. - Right atrium: The atrium was mildly dilated. - Inferior vena cava: The vessel was normal in size. The respirophasic diameter changes were in the normal range (>= 50%), consistent with normal central venous pressure.  Impressions:  - LVEF 60-65%, moderate LVH, normal wall motion, normal GLPSS, diastolic dysfunction by TDI with normal LV filling pressure, trivial AI, mild RAE.  EKG:  EKG is ordered today. The ekg ordered today demonstrates Sinus  rhythm  Recent Labs: 09/14/2019: ALT 18; BUN 22; Creatinine, Ser 0.96; Hemoglobin 13.2; Platelets 229.0; Potassium 4.9; Sodium 139; TSH 2.66   Lipid Panel    Component Value Date/Time   CHOL 173 09/14/2019 1001   CHOL 164 03/26/2018 0919   TRIG 55.0 09/14/2019 1001   HDL 49.80 09/14/2019 1001   HDL 45 03/26/2018 0919   CHOLHDL 3 09/14/2019 1001   VLDL 11.0 09/14/2019 1001   LDLCALC 112 (H) 09/14/2019 1001   LDLCALC 101 (H) 03/26/2018 0919   LDLDIRECT 159.2 08/31/2013 0946     Wt Readings from Last 3 Encounters:  01/29/20 154 lb (69.9 kg)  10/16/19 154 lb (69.9 kg)  09/14/19 158 lb 8 oz (71.9 kg)     Other studies Reviewed: Additional studies/ records that were reviewed today include: . Review of the above records demonstrates:   Assessment and Plan:   1. CAD without angina: He has mild disease in the mid RCA by cath in 2017. No chest pain. Continue ASA and statin.     2. Hyperlipidemia: Lipids followed in primary care. LDL not at goal. He does not tolerate higher doses of statins. He has been taking Crestor three days per week. Continue statin. I will refer to the lipid clinic to discuss Repatha/Praluent.   Current medicines are reviewed at length with the patient today.  The patient does not have concerns regarding medicines.  The following changes have been made:  no change  Labs/ tests ordered today include:   Orders Placed This Encounter  Procedures   AMB Referral to Advanced Lipid Disorders Clinic   EKG 12-Lead    Disposition:   FU with me  in 12  months  Signed, Cristal Deer  Angelena Form, MD 01/29/2020 10:09 AM    [facility] [address], [place], [place]  [address] Phone: [phone]; Fax: [phone]

## 2020-01-29 ENCOUNTER — Ambulatory Visit: Payer: Medicare Other | Admitting: Cardiovascular Disease

## 2020-01-29 ENCOUNTER — Encounter: Payer: Self-pay | Admitting: Cardiovascular Disease

## 2020-01-29 ENCOUNTER — Other Ambulatory Visit: Payer: Self-pay

## 2020-01-29 VITALS — BP 124/74 | HR 61 | Ht 67.0 in | Wt 154.0 lb

## 2020-01-29 DIAGNOSIS — I2510 Atherosclerotic heart disease of native coronary artery without angina pectoris: Secondary | ICD-10-CM

## 2020-01-29 DIAGNOSIS — E7800 Pure hypercholesterolemia, unspecified: Secondary | ICD-10-CM

## 2020-01-29 NOTE — Patient Instructions (Addendum)
Medication Instructions:  Your physician recommends that you continue on your current medications as directed. Please refer to the Current Medication list given to you today.  *If you need a refill on your cardiac medications before your next appointment, please call your pharmacy*   Lab Work: None Ordered If you have labs (blood work) drawn today and your tests are completely normal, you will receive your results only by: Marland Kitchen MyChart Message (if you have MyChart) OR . A paper copy in the mail If you have any lab test that is abnormal or we need to change your treatment, we will call you to review the results.   Testing/Procedures: None Ordered   Follow-Up: At [facility], you and your health needs are our priority.  As part of our continuing mission to provide you with exceptional heart care, we have created designated Provider Care Teams.  These Care Teams include your primary Cardiologist (physician) and Advanced Practice Providers (APPs -  Physician Assistants and Nurse Practitioners) who all work together to provide you with the care you need, when you need it.  We recommend signing up for the patient portal called "MyChart".  Sign up information is provided on this After Visit Summary.  MyChart is used to connect with patients for Virtual Visits (Telemedicine).  Patients are able to view lab/test results, encounter notes, upcoming appointments, etc.  Non-urgent messages can be sent to your provider as well.   To learn more about what you can do with MyChart, go to [email or web address].    Your next appointment:   1 year(s)  The format for your next appointment:   In Person  Provider:   You may see Verne Carrow, MD or one of the following Advanced Practice Providers on your designated Care Team:    Ronie Spies, PA-C  Jacolyn Reedy, PA-C    Other Instructions You have been referred to Lipid Clinic for management of your high cholesterol Monday April 12

## 2020-02-08 ENCOUNTER — Other Ambulatory Visit: Payer: Self-pay

## 2020-02-08 ENCOUNTER — Ambulatory Visit (INDEPENDENT_AMBULATORY_CARE_PROVIDER_SITE_OTHER): Payer: Medicare Other | Admitting: Pharmacist

## 2020-02-08 DIAGNOSIS — E785 Hyperlipidemia, unspecified: Secondary | ICD-10-CM | POA: Diagnosis not present

## 2020-02-08 NOTE — Progress Notes (Signed)
Patient ID: Trevor [name]                 DOB: 02/07/1946                    MRN: [id number]     HPI: Trevor [name] is a 74 y.o. male patient referred to lipid clinic by Dr. Angelena Form. PMH is significant for  HLD, CAD and pericarditis. He has not tolerated high dose statins but has tolerated low dose Crestor recently.   Patient presents today to lipid clinic to discus medication options. Patient states that he does not like to take medications. He states he does not react well to them. Currently on rosuvastatin 5mg  three times a week. States he feels like he is having some memory issues all the sudden.  He is very active and just ran a 1/2 marathon 3 days ago. Patient hoping he could stop rosuvastatin and start Repatha.  Repatha $37/month or $74/90 days  Current Medications: rosuvastatin 5mg  three times a week Intolerances: rousuvastatin 5mg  and 10mg  daily, pitavastatin 1mg  daily (insomnia, jittery) Risk Factors: CAD LDL goal: <70  Diet: breakfast: oatmeal, almond milk, blueberries, cereal Lunch: grilled chicken sandwich, sandwich with coldcuts, corelife Dinner: lean cuisines, protein/fruit smootie  Exercise: run 3 days a week (15-20 miles/week)- ran 1/2 marathon 3 days ago, 2 days week cross training (weights/stretching)  Family History:  Family History  Problem Relation Age of Onset  . Stroke Father 28  . Alcohol abuse Father   . Asthma Brother   . Breast cancer Mother   . HIV Brother   . Prostate cancer Brother 41  . Breast cancer Maternal Aunt   . Breast cancer Maternal Grandmother   . Heart disease Neg Hx   . Diabetes Neg Hx      Social History: former smoker, no etoh  Labs:09/14/2019 TC 173, TG 55, HDL 49, LDL 112  Past Medical History:  Diagnosis Date  . Acute pericarditis    a. admitted as a code STEMI but no sig CAD on cath.   . CAD (coronary artery disease)    30% stenosis mid RCA  . Colonic polyp    Dr. Earlean Shawl; hyperplastic polyp  . Gilbert  syndrome   . Hyperlipidemia     Current Outpatient Medications on File Prior to Visit  Medication Sig Dispense Refill  . Ascorbic Acid (VITA-C PO) Take 1 tablet by mouth daily.    Marland Kitchen aspirin 81 MG tablet Take 81 mg by mouth daily.      . Cholecalciferol (VITAMIN D) 2000 UNITS CAPS Take 2,000 Units by mouth daily.     . fish oil-omega-3 fatty acids 1000 MG capsule Take 1 g by mouth daily.     . Magnesium 250 MG TABS Take 250 mg by mouth daily.     . metroNIDAZOLE (METROCREAM) 0.75 % cream Apply 1 application topically daily.    . Multiple Vitamins-Minerals (MENS 50+ MULTI VITAMIN/MIN PO) Take 1 tablet daily by mouth.    . NON FORMULARY     . pantoprazole (PROTONIX) 40 MG tablet TAKE 1 TABLET(40 MG) BY MOUTH DAILY 90 tablet 3  . rosuvastatin (CRESTOR) 5 MG tablet Take 1 tablet (5 mg total) by mouth 3 (three) times a week. 15 tablet 7  . Turmeric (QC TUMERIC COMPLEX PO) Take by mouth.    . Zinc 25 MG TABS Take 1 tablet by mouth daily.     No current facility-administered medications on file prior to  visit.    Allergies  Allergen Reactions  . Codeine Anaphylaxis    Mental status changes  . Crestor [Rosuvastatin Calcium] Other (See Comments)    Severe fatigue  . Niacin Other (See Comments)    flushing    Assessment/Plan:  1. Hyperlipidemia - LDL is above goal of <70. Discussed the importance of using PCSK9i with a statin, but if he was not tolerating statin could use monotherapy. Reviewed side effects and injection technique of Repatha. Patient requested more information on Repatha to think about what he would like to do. I provided him with the manufacture website to find out more information. He understands that it is preferred to continue with statin. I will call patient in 1 week to see what he has decided.   Thank you,  Olene Floss, Pharm.D, BCPS, CPP [facility]  [address], [place], [place] [address]  Phone: [phone]; Fax: [phone]

## 2020-02-08 NOTE — Patient Instructions (Addendum)
It was a pleasure to meet you today!  Please continue your exercise and current diet.  We recommend that you continue your current medication rosuvastatin 5mg  three time a week and ADD Repatha 140mg  injection every 2 weeks.  You can go to [email or web address] for more information.  I will call you in 1 week to see what you have decided.  Please feel free to call at [phone] with any questions or concerns.

## 2020-02-11 ENCOUNTER — Other Ambulatory Visit: Payer: Self-pay

## 2020-02-11 MED ORDER — ROSUVASTATIN CALCIUM 5 MG PO TABS: 5.0000 mg | ORAL_TABLET | ORAL | 3 refills | Status: DC

## 2020-02-11 NOTE — Telephone Encounter (Signed)
Pt's medication was sent to pt's pharmacy as requested. Confirmation received.  °

## 2020-04-11 ENCOUNTER — Encounter: Payer: Self-pay | Admitting: Family Medicine

## 2020-04-13 ENCOUNTER — Ambulatory Visit: Payer: Medicare Other | Attending: Internal Medicine

## 2020-04-13 DIAGNOSIS — Z20822 Contact with and (suspected) exposure to covid-19: Secondary | ICD-10-CM

## 2020-04-14 LAB — NOVEL CORONAVIRUS, NAA: SARS-CoV-2, NAA: NOT DETECTED

## 2020-04-14 LAB — SARS-COV-2, NAA 2 DAY TAT

## 2020-06-02 ENCOUNTER — Telehealth: Payer: Self-pay | Admitting: Family Medicine

## 2020-06-02 ENCOUNTER — Telehealth: Payer: Self-pay

## 2020-06-02 ENCOUNTER — Telehealth: Payer: Medicare Other | Admitting: Family Medicine

## 2020-06-02 ENCOUNTER — Encounter: Payer: Self-pay | Admitting: Family Medicine

## 2020-06-02 NOTE — Telephone Encounter (Signed)
Patient no showed both virtual appointments due to technical problems, Trevor [name] is wanting to know if he can do an in office visit. Did have a fever of 100.1, and cough to which he said the cough has calmed down and the fever has broke.

## 2020-06-02 NOTE — Telephone Encounter (Signed)
Due to symptoms patient can not be seen in office. Please call patient to schedule virtual visit

## 2020-06-02 NOTE — Telephone Encounter (Signed)
Please advise 

## 2020-06-02 NOTE — Telephone Encounter (Signed)
Pt called stating no one showed up for his virtual visit at 10:20. He walked me through the steps he took and they sounded correct. I told him that his chart says that he did not show. Pt wanted to schedule another visit and will try again to do a virtual at 11:40

## 2020-06-02 NOTE — Progress Notes (Signed)
Noted  

## 2020-06-02 NOTE — Telephone Encounter (Signed)
Patient called in stating they did the covid antigen test from [facility] and he sadi it came back positive.. Wanted advice on what he should do.

## 2020-06-03 ENCOUNTER — Ambulatory Visit (INDEPENDENT_AMBULATORY_CARE_PROVIDER_SITE_OTHER): Payer: Medicare Other | Admitting: Family Medicine

## 2020-06-03 ENCOUNTER — Other Ambulatory Visit: Payer: Self-pay

## 2020-06-03 DIAGNOSIS — U071 COVID-19: Secondary | ICD-10-CM | POA: Diagnosis not present

## 2020-06-03 NOTE — Telephone Encounter (Signed)
Not really sure what they mean exactly. Did he have a positive antibody test or did they do a nasal swab. If he is having mild symptoms we can try to set him up with the antibody infusion clinic since he is at higher risk. If he is having severe symptoms (chest pain, shortness of breath, etc) he needs to go to the ED.  Katina Degree. [name], MD 06/03/2020 8:07 AM

## 2020-06-03 NOTE — Progress Notes (Signed)
   Trevor [name] is a 74 y.o. male who presents today for a telephone visit.  Assessment/Plan:  New/Acute Problems: COVID19 Patient with positive test.  Although symptoms are currently mild, he is at high risk for severe Covid due to his age and other comorbidities.  In addition to standard supportive care including good oral hydration and over-the-counter analgesics as needed, I think he would be a good candidate for monoclonal antibody infusion as well.  Will place referral to have this set up.  Discussed reasons to return to care or seek emergent care.    Subjective:  HPI:  Patient symptoms started 3 days ago.  Cough, fatigue, and fevers.  Took home Covid test which was positive.  Has had quite a bit of fatigue for the past day.  No reported shortness of breath.  No obvious sick contacts.  Patient is not sure if he got it from.      Objective/Observations   NAD, speaking in full sentences  Telephone Visit   I connected with Trevor [name] on 06/03/20 at  1:00 PM EDT via telephone and verified that I am speaking with the correct person using two identifiers. I discussed the limitations of evaluation and management by telemedicine and the availability of in person appointments. The patient expressed understanding and agreed to proceed.   Patient location: Home Provider location: [place] [employer] [facility] Persons participating in the virtual visit: Myself and Patient      Katina Degree. [name], MD 06/03/2020 1:22 PM

## 2020-06-03 NOTE — Progress Notes (Signed)
Noted  

## 2020-06-03 NOTE — Telephone Encounter (Signed)
Patient has a appt scheduled °

## 2020-06-06 ENCOUNTER — Telehealth: Payer: Self-pay | Admitting: Family Medicine

## 2020-06-06 NOTE — Telephone Encounter (Signed)
Nurse Assessment Nurse: Yetta Barre, RN, Miranda Date/Time (Eastern Time): 06/05/2020 11:13:47 AM Confirm and document reason for call. If symptomatic, describe symptoms. ---Caller states he tested positive for COVID on Wednesday. His wife is supposed to go today to get tested. He is wanting to know if it is ok for him to drive her, they have no one else to take her. Has the patient had close contact with a person known or suspected to have the novel coronavirus illness OR traveled / lives in area with major community spread (including international travel) in the last 14 days from the onset of symptoms? * If Asymptomatic, screen for exposure and travel within the last 14 days. ---No Does the patient have any new or worsening symptoms? ---No Please document clinical information provided and list any resource used. ---Told caller the he needs to have the patient contact her PCP for further directions Disp. Time Trevor [name] Time) Disposition Final User 06/05/2020 11:23:58 AM Clinical Call Yes Yetta Barre, RN, Tamera Punt

## 2020-06-06 NOTE — Telephone Encounter (Signed)
PT is returning call to office to Dr [name] about his antibodies and to see what info he found out.

## 2020-06-07 ENCOUNTER — Telehealth: Payer: Self-pay | Admitting: Family Medicine

## 2020-06-07 NOTE — Telephone Encounter (Signed)
Called the Infusion Center Hotline at [phone]. LVM with the patient's Name, MRN #, phone number and our office number.

## 2020-06-07 NOTE — Telephone Encounter (Signed)
error 

## 2020-06-07 NOTE — Telephone Encounter (Signed)
Pt is returning call about some antibody injection that was mentioned to him from Dr. [name]. He request that he be called back, not be sent a message on mychart.

## 2020-06-07 NOTE — Telephone Encounter (Signed)
Patient is following up about this again, he knows Trevor [name] is out of town and would like some help due to his symptoms. patient is requesting a call back once it is taken care of or if we need to wait for dr.parker. Thank you

## 2020-06-08 ENCOUNTER — Other Ambulatory Visit: Payer: Self-pay | Admitting: Oncology

## 2020-06-08 ENCOUNTER — Telehealth: Payer: Self-pay | Admitting: Oncology

## 2020-06-08 DIAGNOSIS — U071 COVID-19: Secondary | ICD-10-CM

## 2020-06-08 NOTE — Telephone Encounter (Signed)
I connected by phone with  Trevor [name] on 06/08/2020 at 06/08/20 to discuss the potential use of an new treatment for mild to moderate COVID-19 viral infection in non-hospitalized patients.   This patient is a age/sex that meets the FDA criteria for Emergency Use Authorization of casirivimab\imdevimab.  Has a (+) direct SARS-CoV-2 viral test result 1. Has mild or moderate COVID-19  2. Is ? 74 years of age and weighs ? 40 kg 3. Is NOT hospitalized due to COVID-19 4. Is NOT requiring oxygen therapy or requiring an increase in baseline oxygen flow rate due to COVID-19 5. Is within 10 days of symptom onset 6. Has at least one of the high risk factor(s) for progression to severe COVID-19 and/or hospitalization as defined in EUA. ? Specific high risk criteria :Age   Symptom onset 06/03/2020.    I have spoken and communicated the following to the patient or parent/caregiver:   1. FDA has authorized the emergency use of casirivimab\imdevimab for the treatment of mild to moderate COVID-19 in adults and pediatric patients with positive results of direct SARS-CoV-2 viral testing who are 54 years of age and older weighing at least 40 kg, and who are at high risk for progressing to severe COVID-19 and/or hospitalization.   2. The significant known and potential risks and benefits of casirivimab\imdevimab, and the extent to which such potential risks and benefits are unknown.   3. Information on available alternative treatments and the risks and benefits of those alternatives, including clinical trials.   4. Patients treated with casirivimab\imdevimab should continue to self-isolate and use infection control measures (e.g., wear mask, isolate, social distance, avoid sharing personal items, clean and disinfect "high touch" surfaces, and frequent handwashing) according to CDC guidelines.    5. The patient or parent/caregiver has the option to accept or refuse casirivimab\imdevimab .   After reviewing this  information with the patient, The patient agreed to proceed with receiving casirivimab\imdevimab infusion and will be provided a copy of the Fact sheet prior to receiving the infusion.Mignon Pine, AGNP-C [phone] (Infusion Center Hotline)

## 2020-06-08 NOTE — Telephone Encounter (Signed)
Patient scheduled appt and was seen

## 2020-06-08 NOTE — Telephone Encounter (Signed)
Patient is requesting [place] to call the infusion hotline center again to try to get him scheduled ASAP. He states he's not doing well. Hotline center # [phone]

## 2020-06-08 NOTE — Telephone Encounter (Signed)
Information sent to Infusion team,patient notified.Informed to go to ER if symptoms worsen

## 2020-06-08 NOTE — Telephone Encounter (Signed)
Pt called again about antibody infusion. He is very concerned and worried, as he is very weak and not getting better. He would greatly appreciate someone to call him back and let him know what he needs to be doing in the meantime of this infusion is going to take awhile.

## 2020-06-09 ENCOUNTER — Ambulatory Visit (HOSPITAL_COMMUNITY)
Admission: RE | Admit: 2020-06-09 | Discharge: 2020-06-09 | Disposition: A | Discharge status: Home / Self Care | Payer: Medicare Other | Source: Ambulatory Visit | Attending: Pulmonary Disease | Admitting: Pulmonary Disease

## 2020-06-09 DIAGNOSIS — Z23 Encounter for immunization: Secondary | ICD-10-CM | POA: Insufficient documentation

## 2020-06-09 DIAGNOSIS — U071 COVID-19: Secondary | ICD-10-CM

## 2020-06-09 MED ORDER — SODIUM CHLORIDE 0.9 % IV SOLN: INTRAVENOUS | Status: DC | PRN

## 2020-06-09 MED ORDER — EPINEPHRINE 0.3 MG/0.3ML IJ SOAJ: 0.3000 mg | Freq: Once | INTRAMUSCULAR | Status: DC | PRN

## 2020-06-09 MED ORDER — ALBUTEROL SULFATE HFA 108 (90 BASE) MCG/ACT IN AERS: 2.0000 | INHALATION_SPRAY | Freq: Once | RESPIRATORY_TRACT | Status: DC | PRN

## 2020-06-09 MED ORDER — FAMOTIDINE IN NACL 20-0.9 MG/50ML-% IV SOLN: 20.0000 mg | Freq: Once | INTRAVENOUS | Status: DC | PRN

## 2020-06-09 MED ORDER — METHYLPREDNISOLONE SODIUM SUCC 125 MG IJ SOLR: 125.0000 mg | Freq: Once | INTRAMUSCULAR | Status: DC | PRN

## 2020-06-09 MED ORDER — DIPHENHYDRAMINE HCL 50 MG/ML IJ SOLN: 50.0000 mg | Freq: Once | INTRAMUSCULAR | Status: DC | PRN

## 2020-06-09 MED ORDER — SODIUM CHLORIDE 0.9 % IV SOLN
1200.0000 mg | Freq: Once | INTRAVENOUS | Status: AC
  Administered 2020-06-09: 1200 mg via INTRAVENOUS
  Filled 2020-06-09: qty 400

## 2020-06-09 NOTE — Discharge Instructions (Signed)

## 2020-06-10 NOTE — Progress Notes (Signed)
  Diagnosis: COVID-19  Physician: Dr. Patrick Wright  Procedure: Covid Infusion Clinic Med: casirivimab\imdevimab infusion - Provided patient with casirivimab\imdevimab fact sheet for patients, parents and caregivers prior to infusion.  Complications: No immediate complications noted.  Discharge: Discharged home   Trevor [name] 06/10/2020  

## 2020-07-11 ENCOUNTER — Encounter: Payer: Self-pay | Admitting: Family Medicine

## 2020-07-11 ENCOUNTER — Ambulatory Visit (INDEPENDENT_AMBULATORY_CARE_PROVIDER_SITE_OTHER): Payer: Medicare Other | Admitting: Family Medicine

## 2020-07-11 ENCOUNTER — Other Ambulatory Visit: Payer: Self-pay

## 2020-07-11 VITALS — BP 110/71 | HR 67 | Temp 97.2°F | Ht 67.0 in | Wt 147.2 lb

## 2020-07-11 DIAGNOSIS — U071 COVID-19: Secondary | ICD-10-CM | POA: Diagnosis not present

## 2020-07-11 DIAGNOSIS — J45990 Exercise induced bronchospasm: Secondary | ICD-10-CM | POA: Diagnosis not present

## 2020-07-11 MED ORDER — ALBUTEROL SULFATE HFA 108 (90 BASE) MCG/ACT IN AERS
2.0000 | INHALATION_SPRAY | Freq: Four times a day (QID) | RESPIRATORY_TRACT | 0 refills | Status: DC | PRN
Start: 2020-07-11 — End: 2020-12-12

## 2020-07-11 NOTE — Assessment & Plan Note (Signed)
Slightly worsened due to recent COVID-19 infection.  Start albuterol inhaler.  Advised to use as needed.  Will follow up in couple of months for his CPE.

## 2020-07-11 NOTE — Progress Notes (Signed)
   CALIPH BOROWIAK is a 74 y.o. male who presents today for an office visit.  Assessment/Plan:  New/Acute Problems: COVID 19 Seems to be recovering normally.  Still has some lingering effects including cough and fatigue.  He will be getting vaccine in a couple months which is 90 days after his antibody infusion.  Discussed typical course of illness.  Despite continued improvement over the next several weeks to months.  Chronic Problems Addressed Today: Exercise-induced asthma Slightly worsened due to recent COVID-19 infection.  Start albuterol inhaler.  Advised to use as needed.  Will follow up in couple of months for his CPE.      Subjective:  HPI:  Patient was seen here about a month ago for Covid infection.  He was started on antibody infusion and his symptoms improved significantly after receiving antibody infusion.  Still has some lingering effects associated with this recent Covid infection including cough, fatigue, and brain fog.  He has been able to run about 3 miles which is less than his typical 6 miles daily.       Objective:  Physical Exam: BP 110/71   Pulse 67   Temp (!) 97.2 F (36.2 C) (Temporal)   Ht 5\' 7"  (1.702 m)   Wt 147 lb 3.2 oz (66.8 kg)   SpO2 98%   BMI 23.05 kg/m   Gen: No acute distress, resting comfortably CV: Regular rate and rhythm with no murmurs appreciated Pulm: Normal work of breathing, clear to auscultation bilaterally with no crackles, wheezes, or rhonchi Neuro: Grossly normal, moves all extremities Psych: Normal affect and thought content      Charice Zuno M. , MD 07/11/2020 10:51 AM

## 2020-07-11 NOTE — Patient Instructions (Addendum)
It was very nice to see you today!  I am glad that you are feeling better. Please try the albuterol inhaler.  Let me know if your symptoms do not continue to improve.  Take care, Dr [name]  Please try these tips to maintain a healthy lifestyle:   Eat at least 3 REAL meals and 1-2 snacks per day.  Aim for no more than 5 hours between eating.  If you eat breakfast, please do so within one hour of getting up.    Each meal should contain half fruits/vegetables, one quarter protein, and one quarter carbs (no bigger than a computer mouse)   Cut down on sweet beverages. This includes juice, soda, and sweet tea.     Drink at least 1 glass of water with each meal and aim for at least 8 glasses per day   Exercise at least 150 minutes every week.   Patient here for follow-up.  Was last seen about a month ago.  That time he was dealing with COVID-19.

## 2020-08-04 ENCOUNTER — Ambulatory Visit: Payer: Medicare Other | Admitting: Sports Medicine

## 2020-08-11 ENCOUNTER — Ambulatory Visit (INDEPENDENT_AMBULATORY_CARE_PROVIDER_SITE_OTHER): Payer: Medicare Other | Admitting: Sports Medicine

## 2020-08-11 ENCOUNTER — Encounter: Payer: Self-pay | Admitting: Sports Medicine

## 2020-08-11 ENCOUNTER — Other Ambulatory Visit: Payer: Self-pay

## 2020-08-11 VITALS — BP 112/62 | Ht 67.0 in | Wt 147.0 lb

## 2020-08-11 DIAGNOSIS — M222X2 Patellofemoral disorders, left knee: Secondary | ICD-10-CM | POA: Diagnosis not present

## 2020-08-11 NOTE — Progress Notes (Signed)
   Subjective:    Patient ID: Trevor [name], male    DOB: 1946/07/16, 74 y.o.   MRN: [id number]  HPI chief complaint: Left knee pain  Trevor [name] comes in today to discuss returning left knee pain.  An MRI done last year showed some degenerative changes of his ACL and PCL but an otherwise healthy knee.  He received a cortisone injection in December and was doing well up until a couple of months ago.  He then began to experience some pain after running.  Now he is getting some discomfort during his runs.  He has noticed some mild swelling as well.  He also notes an inability to completely extend the knee.  No recent trauma.  His last half marathon was in March.  He runs 3 days a week, does strength training 2 days a week, and walks once a week.  No recent trauma.  He did recently have Covid and was fairly symptomatic with that so he had to take some time off from exercise and he wonders if that is contributing to his returning pain.  Interim medical history reviewed Medications reviewed Allergies reviewed    Review of Systems As above    Objective:   Physical Exam  Well-developed, well-nourished.  No acute distress  Left knee: Patient lacks about 3 degrees of extension.  Full flexion.  Trace effusion.  1+ patellofemoral crepitus.  Negative patellar compression test.  No tenderness to palpation along medial or lateral joint lines.  Negative McMurray's.  Knee is grossly stable to ligamentous exam.  Neurovascularly intact distally.      Assessment & Plan:   Returning left knee pain likely secondary to mild patellofemoral DJD  Patient was inquiring about a repeat injection.  At this point we have decided to wait.  He has a 5K race this weekend which will be a good test for him.  This is his first race in several months.  If he is unable to compete at the level that he would like or if he has pain afterwards, then he will return to the office next week for cortisone injection.  Otherwise, I  recommended that he resume his isometric quad exercises daily and wear a compression sleeve with exercise.  He may also use over-the-counter NSAIDs or Tylenol as needed for pain.  Trevor [name] is in agreement with this plan.  Follow-up for ongoing or recalcitrant issues.

## 2020-09-04 ENCOUNTER — Encounter: Payer: Self-pay | Admitting: Family Medicine

## 2020-09-19 ENCOUNTER — Other Ambulatory Visit: Payer: Self-pay | Admitting: Family Medicine

## 2020-09-20 ENCOUNTER — Other Ambulatory Visit: Payer: Self-pay

## 2020-09-20 ENCOUNTER — Ambulatory Visit (INDEPENDENT_AMBULATORY_CARE_PROVIDER_SITE_OTHER): Payer: Medicare Other | Admitting: Family Medicine

## 2020-09-20 ENCOUNTER — Encounter: Payer: Self-pay | Admitting: Family Medicine

## 2020-09-20 VITALS — BP 91/50 | HR 59 | Temp 98.0°F | Ht 67.0 in | Wt 149.0 lb

## 2020-09-20 DIAGNOSIS — J45990 Exercise induced bronchospasm: Secondary | ICD-10-CM | POA: Diagnosis not present

## 2020-09-20 DIAGNOSIS — Z0001 Encounter for general adult medical examination with abnormal findings: Secondary | ICD-10-CM

## 2020-09-20 DIAGNOSIS — R739 Hyperglycemia, unspecified: Secondary | ICD-10-CM

## 2020-09-20 DIAGNOSIS — Z125 Encounter for screening for malignant neoplasm of prostate: Secondary | ICD-10-CM

## 2020-09-20 DIAGNOSIS — E785 Hyperlipidemia, unspecified: Secondary | ICD-10-CM

## 2020-09-20 DIAGNOSIS — K219 Gastro-esophageal reflux disease without esophagitis: Secondary | ICD-10-CM

## 2020-09-20 LAB — CBC
HCT: 37.1 % — ABNORMAL LOW (ref 38.5–50.0)
Hemoglobin: 12.3 g/dL — ABNORMAL LOW (ref 13.2–17.1)
MCH: 31 pg (ref 27.0–33.0)
MCHC: 33.2 g/dL (ref 32.0–36.0)
MCV: 93.5 fL (ref 80.0–100.0)
MPV: 9.7 fL (ref 7.5–12.5)
Platelets: 287 10*3/uL (ref 140–400)
RBC: 3.97 10*6/uL — ABNORMAL LOW (ref 4.20–5.80)
RDW: 12.9 % (ref 11.0–15.0)
WBC: 6 10*3/uL (ref 3.8–10.8)

## 2020-09-20 LAB — PSA: PSA: 0.73 ng/mL (ref <=4.0)

## 2020-09-20 NOTE — Assessment & Plan Note (Signed)
Check A1c. 

## 2020-09-20 NOTE — Assessment & Plan Note (Signed)
Stable.  Continue Protonix 40 mg daily

## 2020-09-20 NOTE — Assessment & Plan Note (Signed)
Seems to be recovering slowly from recent Covid infection.  We will continue using albuterol as needed.  If does not continue to show improvement or symptoms worsen will need referral back to pulmonology.

## 2020-09-20 NOTE — Assessment & Plan Note (Signed)
Continue Crestor 5 mg 3 times weekly.  Check lipids today.

## 2020-09-20 NOTE — Patient Instructions (Signed)
It was very nice to see you today!  We will check blood work today.  Please keep up the good work.  Let me know if you do not continue to show improvement with your exercise endurance over the next several weeks to months.  I will see you back in year for your next physical.  Please come back to see me sooner if needed.  Take care, Dr Jerline Pain  Please try these tips to maintain a healthy lifestyle:   Eat at least 3 REAL meals and 1-2 snacks per day.  Aim for no more than 5 hours between eating.  If you eat breakfast, please do so within one hour of getting up.    Each meal should contain half fruits/vegetables, one quarter protein, and one quarter carbs (no bigger than a computer mouse)   Cut down on sweet beverages. This includes juice, soda, and sweet tea.     Drink at least 1 glass of water with each meal and aim for at least 8 glasses per day   Exercise at least 150 minutes every week.    Preventive Care 25 Years and Older, Male Preventive care refers to lifestyle choices and visits with your health care provider that can promote health and wellness. This includes:  A yearly physical exam. This is also called an annual well check.  Regular dental and eye exams.  Immunizations.  Screening for certain conditions.  Healthy lifestyle choices, such as diet and exercise. What can I expect for my preventive care visit? Physical exam Your health care provider will check:  Height and weight. These may be used to calculate body mass index (BMI), which is a measurement that tells if you are at a healthy weight.  Heart rate and blood pressure.  Your skin for abnormal spots. Counseling Your health care provider may ask you questions about:  Alcohol, tobacco, and drug use.  Emotional well-being.  Home and relationship well-being.  Sexual activity.  Eating habits.  History of falls.  Memory and ability to understand (cognition).  Work and work  Statistician. What immunizations do I need?  Influenza (flu) vaccine  This is recommended every year. Tetanus, diphtheria, and pertussis (Tdap) vaccine  You may need a Td booster every 10 years. Varicella (chickenpox) vaccine  You may need this vaccine if you have not already been vaccinated. Zoster (shingles) vaccine  You may need this after age 8. Pneumococcal conjugate (PCV13) vaccine  One dose is recommended after age 31. Pneumococcal polysaccharide (PPSV23) vaccine  One dose is recommended after age 16. Measles, mumps, and rubella (MMR) vaccine  You may need at least one dose of MMR if you were born in 1957 or later. You may also need a second dose. Meningococcal conjugate (MenACWY) vaccine  You may need this if you have certain conditions. Hepatitis A vaccine  You may need this if you have certain conditions or if you travel or work in places where you may be exposed to hepatitis A. Hepatitis B vaccine  You may need this if you have certain conditions or if you travel or work in places where you may be exposed to hepatitis B. Haemophilus influenzae type b (Hib) vaccine  You may need this if you have certain conditions. You may receive vaccines as individual doses or as more than one vaccine together in one shot (combination vaccines). Talk with your health care provider about the risks and benefits of combination vaccines. What tests do I need? Blood tests  Lipid and  cholesterol levels. These may be checked every 5 years, or more frequently depending on your overall health.  Hepatitis C test.  Hepatitis B test. Screening  Lung cancer screening. You may have this screening every year starting at age 64 if you have a 30-pack-year history of smoking and currently smoke or have quit within the past 15 years.  Colorectal cancer screening. All adults should have this screening starting at age 76 and continuing until age 42. Your health care provider may recommend  screening at age 46 if you are at increased risk. You will have tests every 1-10 years, depending on your results and the type of screening test.  Prostate cancer screening. Recommendations will vary depending on your family history and other risks.  Diabetes screening. This is done by checking your blood sugar (glucose) after you have not eaten for a while (fasting). You may have this done every 1-3 years.  Abdominal aortic aneurysm (AAA) screening. You may need this if you are a current or former smoker.  Sexually transmitted disease (STD) testing. Follow these instructions at home: Eating and drinking  Eat a diet that includes fresh fruits and vegetables, whole grains, lean protein, and low-fat dairy products. Limit your intake of foods with high amounts of sugar, saturated fats, and salt.  Take vitamin and mineral supplements as recommended by your health care provider.  Do not drink alcohol if your health care provider tells you not to drink.  If you drink alcohol: ? Limit how much you have to 0-2 drinks a day. ? Be aware of how much alcohol is in your drink. In the [place] one drink equals one 12 oz bottle of beer (355 mL), one 5 oz glass of wine (148 mL), or one 1 oz glass of hard liquor (44 mL). Lifestyle  Take daily care of your teeth and gums.  Stay active. Exercise for at least 30 minutes on 5 or more days each week.  Do not use any products that contain nicotine or tobacco, such as cigarettes, e-cigarettes, and chewing tobacco. If you need help quitting, ask your health care provider.  If you are sexually active, practice safe sex. Use a condom or other form of protection to prevent STIs (sexually transmitted infections).  Talk with your health care provider about taking a low-dose aspirin or statin. What's next?  Visit your health care provider once a year for a well check visit.  Ask your health care provider how often you should have your eyes and teeth  checked.  Stay up to date on all vaccines. This information is not intended to replace advice given to you by your health care provider. Make sure you discuss any questions you have with your health care provider. Document Revised: 10/09/2018 Document Reviewed: 10/09/2018 Elsevier Patient Education  2020 [employer].

## 2020-09-20 NOTE — Progress Notes (Signed)
Chief Complaint:  Trevor [name] is a 74 y.o. male who presents today for his annual comprehensive physical exam.    Assessment/Plan:  Chronic Problems Addressed Today: Gastroesophageal reflux disease without esophagitis Stable.  Continue Protonix 40 mg daily.  Exercise-induced asthma Seems to be recovering slowly from recent Covid infection.  We will continue using albuterol as needed.  If does not continue to show improvement or symptoms worsen will need referral back to pulmonology.  Hyperlipidemia with target LDL less than 70 Continue Crestor 5 mg 3 times weekly.  Check lipids today.  Hyperglycemia Check A1c.  Preventative Healthcare: Has received first dose of Covid vaccine.  Will be getting second vaccine soon.  Up-to-date on colon cancer screening.  Check CBC, CMET, TSH, lipid panel.  Patient Counseling(The following topics were reviewed and/or handout was given):  -Nutrition: Stressed importance of moderation in sodium/caffeine intake, saturated fat and cholesterol, caloric balance, sufficient intake of fresh fruits, vegetables, and fiber.  -Stressed the importance of regular exercise.   -Substance Abuse: Discussed cessation/primary prevention of tobacco, alcohol, or other drug use; driving or other dangerous activities under the influence; availability of treatment for abuse.   -Injury prevention: Discussed safety belts, safety helmets, smoke detector, smoking near bedding or upholstery.   -Sexuality: Discussed sexually transmitted diseases, partner selection, use of condoms, avoidance of unintended pregnancy and contraceptive alternatives.   -Dental health: Discussed importance of regular tooth brushing, flossing, and dental visits.  -Health maintenance and immunizations reviewed. Please refer to Health maintenance section.  Return to care in 1 year for next preventative visit.     Subjective:  HPI:  He has no acute complaints today.   Lifestyle Diet:  Balanced.  Exercise: Does a lot of running.   Depression screen [facility] 2/9 09/20/2020  Decreased Interest 0  Down, Depressed, Hopeless 0  PHQ - 2 Score 0  Altered sleeping -  Tired, decreased energy -  Change in appetite -  Feeling bad or failure about yourself  -  Trouble concentrating -  Moving slowly or fidgety/restless -  Suicidal thoughts -  PHQ-9 Score -    There are no preventive care reminders to display for this patient.   ROS: Per HPI, otherwise a complete review of systems was negative.   PMH:  The following were reviewed and entered/updated in epic: Past Medical History:  Diagnosis Date  . Acute pericarditis    a. admitted as a code STEMI but no sig CAD on cath.   . CAD (coronary artery disease)    30% stenosis mid RCA  . Colonic polyp    Dr. Kinnie Scales; hyperplastic polyp  . Gilbert syndrome   . Hyperlipidemia    Patient Active Problem List   Diagnosis Date Noted  . Hyperglycemia 09/20/2020  . Left leg pain 02/17/2018  . Bilateral knee pain 06/18/2017  . Deficiency anemia 09/03/2016  . Gastroesophageal reflux disease without esophagitis 09/03/2016  . Exercise-induced asthma 03/14/2016  . CAD (coronary artery disease)   . Gilbert syndrome 09/06/2014  . History of basal cell cancer 08/31/2013  . Hyperplastic colonic polyp 09/09/2008  . Hyperlipidemia with target LDL less than 70 09/01/2007  . BPH without obstruction/lower urinary tract symptoms 09/01/2007   Past Surgical History:  Procedure Laterality Date  . actinic keratoses     facial removed x 2, Dr Danella Deis  . basal cell cancer  2013 & 2015   Dr Campbell Stall  . CARDIAC CATHETERIZATION N/A 10/30/2015   Procedure: Left Heart Cath and  Coronary Angiography;  Surgeon: Kathleene Hazel, MD;  Location: [facility] INVASIVE CV LAB;  Service: Cardiovascular;  Laterality: N/A;  . COLONOSCOPY  2014   negative  . COLONOSCOPY W/ POLYPECTOMY  2004   @ age 68, Dr Kinnie Scales. ? Hyperplastic polyp  . FLEXIBLE SIGMOIDOSCOPY   1999   negative  . KNEE ARTHROSCOPY  1984   right    Family History  Problem Relation Age of Onset  . Stroke Father 24  . Alcohol abuse Father   . Asthma Brother   . Breast cancer Mother   . HIV Brother   . Prostate cancer Brother 71  . Breast cancer Maternal Aunt   . Breast cancer Maternal Grandmother   . Heart disease Neg Hx   . Diabetes Neg Hx     Medications- reviewed and updated Current Outpatient Medications  Medication Sig Dispense Refill  . albuterol (VENTOLIN HFA) 108 (90 Base) MCG/ACT inhaler Inhale 2 puffs into the lungs every 6 (six) hours as needed for wheezing or shortness of breath. 8 g 0  . Ascorbic Acid (VITA-C PO) Take 1 tablet by mouth daily.    Marland Kitchen aspirin 81 MG tablet Take 81 mg by mouth daily.      . Cholecalciferol (VITAMIN D) 2000 UNITS CAPS Take 2,000 Units by mouth daily.     . fish oil-omega-3 fatty acids 1000 MG capsule Take 1 g by mouth daily.     . Magnesium 250 MG TABS Take 250 mg by mouth daily.     . metroNIDAZOLE (METROCREAM) 0.75 % cream Apply 1 application topically daily.    . Multiple Vitamins-Minerals (MENS 50+ MULTI VITAMIN/MIN PO) Take 1 tablet daily by mouth.    . NON FORMULARY     . pantoprazole (PROTONIX) 40 MG tablet TAKE 1 TABLET(40 MG) BY MOUTH DAILY 30 tablet 3  . rosuvastatin (CRESTOR) 5 MG tablet Take 1 tablet (5 mg total) by mouth 3 (three) times a week. 45 tablet 3  . Turmeric (QC TUMERIC COMPLEX PO) Take by mouth.    . Zinc 25 MG TABS Take 1 tablet by mouth daily.     No current facility-administered medications for this visit.    Allergies-reviewed and updated Allergies  Allergen Reactions  . Codeine Anaphylaxis    Mental status changes  . Niacin Other (See Comments)    flushing    Social History   Socioeconomic History  . Marital status: Married    Spouse name: Not on file  . Number of children: Not on file  . Years of education: Not on file  . Highest education level: Not on file  Occupational History  .  Not on file  Tobacco Use  . Smoking status: Former Smoker    Packs/day: 1.00    Years: 13.00    Pack years: 13.00    Types: Cigarettes    Quit date: 10/29/1978    Years since quitting: 41.9  . Smokeless tobacco: Never Used  . Tobacco comment: smoked 1967-1980, up to 1 ppd  Vaping Use  . Vaping Use: Never used  Substance and Sexual Activity  . Alcohol use: No    Alcohol/week: 0.0 standard drinks  . Drug use: No  . Sexual activity: Not on file  Other Topics Concern  . Not on file  Social History Narrative   Originally from [place]. Moved to [place] in 1951. He moved to [place] in 1984. 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 Previously has  traveled to [place], [place], [place], [place], [place], and [place]. Normally has a cockatiel but not currently No mold exposure. Has a hot tub but doesn't use it regularly. No children    Social Determinants of Health   Financial Resource Strain:   . Difficulty of Paying Living Expenses: Not on file  Food Insecurity:   . Worried About Programme researcher, broadcasting/film/video in the Last Year: Not on file  . Ran Out of Food in the Last Year: Not on file  Transportation Needs:   . Lack of Transportation (Medical): Not on file  . Lack of Transportation (Non-Medical): Not on file  Physical Activity:   . Days of Exercise per Week: Not on file  . Minutes of Exercise per Session: Not on file  Stress:   . Feeling of Stress : Not on file  Social Connections:   . Frequency of Communication with Friends and Family: Not on file  . Frequency of Social Gatherings with Friends and Family: Not on file  . Attends Religious Services: Not on file  . Active Member of Clubs or Organizations: Not on file  . Attends Banker Meetings: Not on file  . Marital Status: Not on file        Objective:  Physical Exam: BP (!) 91/50   Pulse (!) 59   Temp 98 F (36.7 C) (Temporal)   Ht 5\' 7"  (1.702 m)   Wt 149 lb (67.6 kg)    SpO2 100%   BMI 23.34 kg/m   Body mass index is 23.34 kg/m. Wt Readings from Last 3 Encounters:  09/20/20 149 lb (67.6 kg)  08/11/20 147 lb (66.7 kg)  07/11/20 147 lb 3.2 oz (66.8 kg)   Gen: NAD, resting comfortably HEENT: TMs normal bilaterally. OP clear. No thyromegaly noted.  CV: RRR with no murmurs appreciated Pulm: NWOB, CTAB with no crackles, wheezes, or rhonchi GI: Normal bowel sounds present. Soft, Nontender, Nondistended. MSK: no edema, cyanosis, or clubbing noted Skin: warm, dry Neuro: CN2-12 grossly intact. Strength 5/5 in upper and lower extremities. Reflexes symmetric and intact bilaterally.  Psych: Normal affect and thought content     Kenzly Rogoff M. 07/13/20, MD 09/20/2020 10:10 AM

## 2020-09-21 ENCOUNTER — Other Ambulatory Visit: Payer: Self-pay

## 2020-09-21 DIAGNOSIS — D649 Anemia, unspecified: Secondary | ICD-10-CM

## 2020-09-21 LAB — COMPREHENSIVE METABOLIC PANEL
AG Ratio: 1.8 (calc) (ref 1.0–2.5)
ALT: 17 U/L (ref 9–46)
AST: 25 U/L (ref 10–35)
Albumin: 4.2 g/dL (ref 3.6–5.1)
Alkaline phosphatase (APISO): 49 U/L (ref 35–144)
BUN: 22 mg/dL (ref 7–25)
CO2: 30 mmol/L (ref 20–32)
Calcium: 9.3 mg/dL (ref 8.6–10.3)
Chloride: 102 mmol/L (ref 98–110)
Creat: 1 mg/dL (ref 0.70–1.18)
Globulin: 2.3 g/dL (calc) (ref 1.9–3.7)
Glucose, Bld: 88 mg/dL (ref 65–99)
Potassium: 5.1 mmol/L (ref 3.5–5.3)
Sodium: 138 mmol/L (ref 135–146)
Total Bilirubin: 1.1 mg/dL (ref 0.2–1.2)
Total Protein: 6.5 g/dL (ref 6.1–8.1)

## 2020-09-21 LAB — HEMOGLOBIN A1C
Hgb A1c MFr Bld: 5.6 % of total Hgb (ref <5.7)
Mean Plasma Glucose: 114 (calc)
eAG (mmol/L): 6.3 (calc)

## 2020-09-21 LAB — TSH: TSH: 3.32 mIU/L (ref 0.40–4.50)

## 2020-09-21 LAB — LIPID PANEL
Cholesterol: 180 mg/dL (ref <200)
HDL: 55 mg/dL (ref >=40)
LDL Cholesterol (Calc): 109 mg/dL (calc) — ABNORMAL HIGH
Non-HDL Cholesterol (Calc): 125 mg/dL (calc) (ref <130)
Total CHOL/HDL Ratio: 3.3 (calc) (ref <5.0)
Triglycerides: 73 mg/dL (ref <150)

## 2020-09-21 NOTE — Progress Notes (Signed)
Please inform patient of the following:  He had a slight drop in his blood counts but everything else is STABLE. Would like for him to come back in a couple of weeks to recheck CBC, iron panel with ferritin, and B12 level.   Do not need to make any other changes to his treatment plan at this time.   Katina Degree. [name], MD 09/21/2020 9:36 AM

## 2020-09-26 ENCOUNTER — Other Ambulatory Visit: Payer: Self-pay | Admitting: *Deleted

## 2020-10-06 ENCOUNTER — Encounter: Payer: Self-pay | Admitting: Family Medicine

## 2020-10-10 ENCOUNTER — Encounter: Payer: Self-pay | Admitting: Family Medicine

## 2020-10-10 NOTE — Telephone Encounter (Signed)
See note

## 2020-10-19 ENCOUNTER — Other Ambulatory Visit: Payer: Self-pay

## 2020-10-19 ENCOUNTER — Other Ambulatory Visit (INDEPENDENT_AMBULATORY_CARE_PROVIDER_SITE_OTHER): Payer: Medicare Other

## 2020-10-19 DIAGNOSIS — D649 Anemia, unspecified: Secondary | ICD-10-CM

## 2020-10-20 LAB — CBC WITH DIFFERENTIAL/PLATELET
Absolute Monocytes: 660 cells/uL (ref 200–950)
Basophils Absolute: 72 cells/uL (ref 0–200)
Basophils Relative: 1.2 %
Eosinophils Absolute: 150 cells/uL (ref 15–500)
Eosinophils Relative: 2.5 %
HCT: 37.3 % — ABNORMAL LOW (ref 38.5–50.0)
Hemoglobin: 12.6 g/dL — ABNORMAL LOW (ref 13.2–17.1)
Lymphs Abs: 2532 cells/uL (ref 850–3900)
MCH: 31.5 pg (ref 27.0–33.0)
MCHC: 33.8 g/dL (ref 32.0–36.0)
MCV: 93.3 fL (ref 80.0–100.0)
MPV: 9.4 fL (ref 7.5–12.5)
Monocytes Relative: 11 %
Neutro Abs: 2586 cells/uL (ref 1500–7800)
Neutrophils Relative %: 43.1 %
Platelets: 299 10*3/uL (ref 140–400)
RBC: 4 10*6/uL — ABNORMAL LOW (ref 4.20–5.80)
RDW: 12.4 % (ref 11.0–15.0)
Total Lymphocyte: 42.2 %
WBC: 6 10*3/uL (ref 3.8–10.8)

## 2020-10-20 LAB — FERRITIN: Ferritin: 45 ng/mL (ref 24–380)

## 2020-10-20 LAB — VITAMIN B12: Vitamin B-12: 461 pg/mL (ref 200–1100)

## 2020-10-20 LAB — IRON: Iron: 104 ug/dL (ref 50–180)

## 2020-10-20 NOTE — Progress Notes (Signed)
Please inform patient of the following:  Blood counts are stable. Do not need to do any further testing at this point but we can recheck in 6-12 months.  Trevor [name]. [name], MD 10/20/2020 9:46 AM

## 2020-10-30 ENCOUNTER — Other Ambulatory Visit: Payer: Self-pay | Admitting: Family Medicine

## 2020-10-31 ENCOUNTER — Other Ambulatory Visit: Payer: Medicare Other

## 2020-10-31 DIAGNOSIS — Z20822 Contact with and (suspected) exposure to covid-19: Secondary | ICD-10-CM

## 2020-11-01 LAB — SARS-COV-2, NAA 2 DAY TAT

## 2020-11-01 LAB — NOVEL CORONAVIRUS, NAA: SARS-CoV-2, NAA: NOT DETECTED

## 2020-12-08 ENCOUNTER — Other Ambulatory Visit: Payer: Medicare Other

## 2020-12-08 DIAGNOSIS — Z20822 Contact with and (suspected) exposure to covid-19: Secondary | ICD-10-CM

## 2020-12-09 LAB — NOVEL CORONAVIRUS, NAA: SARS-CoV-2, NAA: NOT DETECTED

## 2020-12-09 LAB — SARS-COV-2, NAA 2 DAY TAT

## 2020-12-12 ENCOUNTER — Other Ambulatory Visit: Payer: Self-pay

## 2020-12-12 ENCOUNTER — Ambulatory Visit (INDEPENDENT_AMBULATORY_CARE_PROVIDER_SITE_OTHER): Payer: Medicare Other

## 2020-12-12 VITALS — BP 100/72 | HR 68 | Temp 97.3°F | Resp 20 | Wt 155.6 lb

## 2020-12-12 DIAGNOSIS — Z0000 Encounter for general adult medical examination without abnormal findings: Secondary | ICD-10-CM

## 2020-12-12 NOTE — Progress Notes (Signed)
Subjective:   Trevor [name] is a 75 y.o. male who presents for Medicare Annual/Subsequent preventive examination.  Review of Systems     Cardiac Risk Factors include: advanced age (>89men, >77 women);dyslipidemia;male gender     Objective:    Today's Vitals   12/12/20 1304  BP: 100/72  Pulse: 68  Resp: 20  Temp: (!) 97.3 F (36.3 C)  SpO2: 98%  Weight: 155 lb 9.6 oz (70.6 kg)   Body mass index is 24.37 kg/m.  Advanced Directives 12/12/2020 09/28/2019 09/22/2018 09/11/2017 07/03/2017 09/26/2015 09/05/2015  Does Patient Have a Medical Advance Directive? Yes Yes Yes Yes No No No  Type of Diplomatic Services operational officer Living will;Healthcare Power of Attorney - - - - -  Does patient want to make changes to medical advance directive? - No - Patient declined - - - - -  Copy of Healthcare Power of Attorney in Chart? No - copy requested No - copy requested - - - - -  Would patient like information on creating a medical advance directive? - - - - - No - patient declined information No - patient declined information    Current Medications (verified) Outpatient Encounter Medications as of 12/12/2020  Medication Sig  . Ascorbic Acid (VITA-C PO) Take 1 tablet by mouth daily.  Marland Kitchen aspirin 81 MG tablet Take 81 mg by mouth daily.    . Cholecalciferol (VITAMIN D) 2000 UNITS CAPS Take 2,000 Units by mouth daily.   . fish oil-omega-3 fatty acids 1000 MG capsule Take 1 g by mouth daily.   . Magnesium 250 MG TABS Take 250 mg by mouth daily.   . metroNIDAZOLE (METROCREAM) 0.75 % cream Apply 1 application topically daily.  . Multiple Vitamins-Minerals (MENS 50+ MULTI VITAMIN/MIN PO) Take 1 tablet daily by mouth.  . pantoprazole (PROTONIX) 40 MG tablet TAKE 1 TABLET(40 MG) BY MOUTH DAILY  . rosuvastatin (CRESTOR) 5 MG tablet Take 1 tablet (5 mg total) by mouth 3 (three) times a week.  . Turmeric (QC TUMERIC COMPLEX PO) Take by mouth.  . Zinc 25 MG TABS Take 1 tablet by mouth  daily.  . NON FORMULARY   . [DISCONTINUED] albuterol (VENTOLIN HFA) 108 (90 Base) MCG/ACT inhaler Inhale 2 puffs into the lungs every 6 (six) hours as needed for wheezing or shortness of breath. (Patient not taking: Reported on 12/12/2020)   No facility-administered encounter medications on file as of 12/12/2020.    Allergies (verified) Codeine and Niacin   History: Past Medical History:  Diagnosis Date  . Acute pericarditis    a. admitted as a code STEMI but no sig CAD on cath.   . CAD (coronary artery disease)    30% stenosis mid RCA  . Colonic polyp    Dr. Kinnie Scales; hyperplastic polyp  . Gilbert syndrome   . Hyperlipidemia    Past Surgical History:  Procedure Laterality Date  . actinic keratoses     facial removed x 2, Dr Danella Deis  . basal cell cancer  2013 & 2015   Dr Campbell Stall  . CARDIAC CATHETERIZATION N/A 10/30/2015   Procedure: Left Heart Cath and Coronary Angiography;  Surgeon: Kathleene Hazel, MD;  Location: [facility] INVASIVE CV LAB;  Service: Cardiovascular;  Laterality: N/A;  . COLONOSCOPY  2014   negative  . COLONOSCOPY W/ POLYPECTOMY  2004   @ age 33, Dr Kinnie Scales. ? Hyperplastic polyp  . FLEXIBLE SIGMOIDOSCOPY  1999   negative  . KNEE ARTHROSCOPY  1984  right   Family History  Problem Relation Age of Onset  . Stroke Father [id number]  . Alcohol abuse Father   . Asthma Brother   . Breast cancer Mother   . HIV Brother   . Prostate cancer Brother [id number]  . Breast cancer Maternal Aunt   . Breast cancer Maternal Grandmother   . Heart disease Neg Hx   . Diabetes Neg Hx    Social History   Socioeconomic History  . Marital status: Married    Spouse name: Not on file  . Number of children: Not on file  . Years of education: Not on file  . Highest education level: Not on file  Occupational History  . Occupation: retired   Tobacco Use  . Smoking status: Former Smoker    Packs/day: 1.00    Years: 13.00    Pack years: 13.00    Types: Cigarettes    Quit date:  10/29/1978    Years since quitting: 42.1  . Smokeless tobacco: Never Used  . Tobacco comment: smoked 1967-1980, up to 1 ppd  Vaping Use  . Vaping Use: Never used  Substance and Sexual Activity  . Alcohol use: No    Alcohol/week: 0.0 standard drinks  . Drug use: No  . Sexual activity: Not on file  Other Topics Concern  . Not on file  Social History Narrative   Originally from [place]. [place]oved to [place] in [place]. He moved to [place] in 1984. 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 Previously has traveled to [place], [place], [place], [place], [place], [place], and [place]. Normally has a cockatiel but not currently No mold exposure. Has a hot tub but doesn't use it regularly. No children    Social Determinants of Health   Financial Resource Strain: Low Risk   . Difficulty of Paying Living Expenses: Not hard at all  Food Insecurity: No Food Insecurity  . Worried About Programme researcher, broadcasting/film/videounning Out of Food in the Last Year: Never true  . Ran Out of Food in the Last Year: Never true  Transportation Needs: No Transportation Needs  . Lack of Transportation (Medical): No  . Lack of Transportation (Non-Medical): No  Physical Activity: Sufficiently Active  . Days of Exercise per Week: 5 days  . Minutes of Exercise per Session: 90 min  Stress: No Stress Concern Present  . Feeling of Stress : Not at all  Social Connections: Socially Integrated  . Frequency of Communication with Friends and Family: More than three times a week  . Frequency of Social Gatherings with Friends and Family: Once a week  . Attends Religious Services: More than 4 times per year  . Active Member of Clubs or Organizations: Yes  . Attends BankerClub or Organization Meetings: 1 to 4 times per year  . Marital Status: Married    Tobacco Counseling Counseling given: Not Answered Comment: smoked 1967-1980, up to 1 ppd   Clinical Intake:  Pre-visit preparation completed: Yes  Pain : No/denies pain      BMI - recorded: 24.37 Nutritional Status: BMI of 19-24  Normal Nutritional Risks: None Diabetes: No  How often do you need to have someone help you when you read instructions, pamphlets, or other written materials from your doctor or pharmacy?: 1 - Never  Diabetic?No  Interpreter Needed?: No  Information entered by :: Lanier Ensignina Deniesha Stenglein, LPN   Activities of Daily Living In your present state of health, do you have any difficulty performing the following activities: 12/12/2020  Hearing? N  Vision? N  Difficulty concentrating or making decisions? N  Walking or climbing stairs? N  Dressing or bathing? N  Doing errands, shopping? N  Preparing Food and eating ? N  Using the Toilet? N  In the past six months, have you accidently leaked urine? N  Do you have problems with loss of bowel control? N  Managing your Medications? N  Managing your Finances? N  Housekeeping or managing your Housekeeping? N  Some recent data might be hidden    Patient Care Team: Ardith Dark, MD as PCP - General (Family Medicine) Kathleene Hazel, MD as PCP - Cardiology (Cardiology) Estill Bamberg, MD as Consulting Physician (Orthopedic Surgery)  Indicate any recent Medical Services you may have received from other than [facility] providers in the past year (date may be approximate).     Assessment:   This is a routine wellness examination for Aryn.  Hearing/Vision screen  Hearing Screening   125Hz  250Hz  500Hz  1000Hz  2000Hz  3000Hz  4000Hz  6000Hz  8000Hz   Right ear:           Left ear:           Comments: Pt denies any hearing issues   Vision Screening Comments: Pt follows up annually with  [facility] [address]   Dietary issues and exercise activities discussed: Current Exercise Habits: Home exercise routine, Type of exercise: walking, Time (Minutes): > 60, Frequency (Times/Week): 5, Weekly Exercise (Minutes/Week): 0  Goals    . Exercise 150 min/wk Moderate Activity     Keep running and  enjoy your exercise routine     . patient      Look for avenues of growth in other areas; teaching etc     . Patient Stated     KEEP UP WITH RUNNING AND DO MARATHONS       Depression Screen PHQ 2/9 Scores 12/12/2020 09/20/2020 09/14/2019 09/22/2018 09/11/2017 07/03/2017 09/05/2016  PHQ - 2 Score 0 0 0 0 0 - 0  PHQ- 9 Score - - 0 - - - -  Exception Documentation - - - - - Other- indicate reason in comment box -    Fall Risk Fall Risk  12/12/2020 09/28/2019 09/14/2019 09/22/2018 09/11/2017  Falls in the past year? 0 0 0 0 No  Number falls in past yr: 0 - - - -  Injury with Fall? 0 0 - - -  Risk for fall due to : Impaired vision - - - -  Follow up Falls prevention discussed Falls evaluation completed;Falls prevention discussed;Education provided - - -    FALL RISK PREVENTION PERTAINING TO THE HOME:  Any stairs in or around the home? Yes  If so, are there any without handrails? No  Home free of loose throw rugs in walkways, pet beds, electrical cords, etc? Yes  Adequate lighting in your home to reduce risk of falls? Yes   ASSISTIVE DEVICES UTILIZED TO PREVENT FALLS:  Life alert? No  Use of a cane, walker or w/c? No  Grab bars in the bathroom? No  Shower chair or bench in shower? No  Elevated toilet seat or a handicapped toilet? No   TIMED UP AND GO:  Was the test performed? Yes .  Length of time to ambulate 10 feet: 10 sec.   Gait steady and fast without use of assistive device  Cognitive Function: Declines 6CIT  MMSE - Mini Mental State Exam 09/22/2018 09/11/2017  Not completed: (No Data) (No Data)  Immunizations Immunization History  Administered Date(s) Administered  . Moderna Sars-Covid-2 Vaccination 09/08/2020, 10/06/2020  . Pneumococcal Conjugate-13 10/21/2015  . Pneumococcal Polysaccharide-23 06/13/2017  . Td 09/09/2008  . Zoster 09/17/2014  . Zoster Recombinat (Shingrix) 09/21/2019    TDAP status: Due, Education has been provided regarding the  importance of this vaccine. Advised may receive this vaccine at local pharmacy or Health Dept. Aware to provide a copy of the vaccination record if obtained from local pharmacy or Health Dept. Verbalized acceptance and understanding.  Flu Vaccine status: Due, Education has been provided regarding the importance of this vaccine. Advised may receive this vaccine at local pharmacy or Health Dept. Aware to provide a copy of the vaccination record if obtained from local pharmacy or Health Dept. Verbalized acceptance and understanding.  Pneumococcal vaccine status: Up to date  Covid-19 vaccine status: Completed vaccines    Qualifies for Shingles Vaccine? Yes   Zostavax completed Yes   Shingrix Completed?: Yes states he received will call back with dates   Screening Tests Health Maintenance  Topic Date Due  . COVID-19 Vaccine (3 - Moderna risk 4-dose series) 11/03/2020  . INFLUENZA VACCINE  01/26/2021 (Originally 05/29/2020)  . TETANUS/TDAP  07/11/2021 (Originally 09/09/2018)  . COLONOSCOPY (Pts 45-87yrs Insurance coverage will need to be confirmed)  08/30/2023  . Hepatitis C Screening  Completed  . PNA vac Low Risk Adult  Completed    Health Maintenance  Health Maintenance Due  Topic Date Due  . COVID-19 Vaccine (3 - Moderna risk 4-dose series) 11/03/2020    Colorectal cancer screening: Type of screening: Colonoscopy. Completed 08/29/13. Repeat every 10 years  Additional Screening:  Hepatitis C Screening: Completed 09/03/16  Vision Screening: Recommended annual ophthalmology exams for early detection of glaucoma and other disorders of the eye. Is the patient up to date with their annual eye exam?  Yes  Who is the provider or what is the name of the office in which the patient attends annual eye exams? Pt follows up annually  If pt is not established with a provider, would they like to be referred to a provider to establish care? No .   Dental Screening: Recommended annual dental exams  for proper oral hygiene  Community Resource Referral / Chronic Care Management: CRR required this visit?  No   CCM required this visit?  No      Plan:     I have personally reviewed and noted the following in the patient's chart:   . Medical and social history . Use of alcohol, tobacco or illicit drugs  . Current medications and supplements . Functional ability and status . Nutritional status . Physical activity . Advanced directives . List of other physicians . Hospitalizations, surgeries, and ER visits in previous 12 months . Vitals . Screenings to include cognitive, depression, and falls . Referrals and appointments  In addition, I have reviewed and discussed with patient certain preventive protocols, quality metrics, and best practice recommendations. A written personalized care plan for preventive services as well as general preventive health recommendations were provided to patient.     [name], LPN   7/74/1287   Nurse Notes: None

## 2020-12-12 NOTE — Patient Instructions (Addendum)
Trevor [name] , Thank you for taking time to come for your Medicare Wellness Visit. I appreciate your ongoing commitment to your health goals. Please review the following plan we discussed and let me know if I can assist you in the future.   Screening recommendations/referrals: Colonoscopy: Done 08/29/13 Recommended yearly ophthalmology/optometry visit for glaucoma screening and checkup Recommended yearly dental visit for hygiene and checkup  Vaccinations: Influenza vaccine: Due and postponed until later date  Pneumococcal vaccine: Up to date Tdap vaccine: Due and discussed Shingles vaccine: 1st dose 09/21/19   Covid-19: Completed 11/11 & 10/06/20  Advanced directives: Please bring a copy of your health care power of attorney and living will to the office at your convenience.   Conditions/risks identified: Maintain running  regimen  Next appointment: Follow up in one year for your annual wellness visit.   Preventive Care 81 Years and Older, Male Preventive care refers to lifestyle choices and visits with your health care provider that can promote health and wellness. What does preventive care include?  A yearly physical exam. This is also called an annual well check.  Dental exams once or twice a year.  Routine eye exams. Ask your health care provider how often you should have your eyes checked.  Personal lifestyle choices, including:  Daily care of your teeth and gums.  Regular physical activity.  Eating a healthy diet.  Avoiding tobacco and drug use.  Limiting alcohol use.  Practicing safe sex.  Taking low doses of aspirin every day.  Taking vitamin and mineral supplements as recommended by your health care provider. What happens during an annual well check? The services and screenings done by your health care provider during your annual well check will depend on your age, overall health, lifestyle risk factors, and family history of disease. Counseling  Your health  care provider may ask you questions about your:  Alcohol use.  Tobacco use.  Drug use.  Emotional well-being.  Home and relationship well-being.  Sexual activity.  Eating habits.  History of falls.  Memory and ability to understand (cognition).  Work and work Astronomer. Screening  You may have the following tests or measurements:  Height, weight, and BMI.  Blood pressure.  Lipid and cholesterol levels. These may be checked every 5 years, or more frequently if you are over 63 years old.  Skin check.  Lung cancer screening. You may have this screening every year starting at age 84 if you have a 30-pack-year history of smoking and currently smoke or have quit within the past 15 years.  Fecal occult blood test (FOBT) of the stool. You may have this test every year starting at age 99.  Flexible sigmoidoscopy or colonoscopy. You may have a sigmoidoscopy every 5 years or a colonoscopy every 10 years starting at age 27.  Prostate cancer screening. Recommendations will vary depending on your family history and other risks.  Hepatitis C blood test.  Hepatitis B blood test.  Sexually transmitted disease (STD) testing.  Diabetes screening. This is done by checking your blood sugar (glucose) after you have not eaten for a while (fasting). You may have this done every 1-3 years.  Abdominal aortic aneurysm (AAA) screening. You may need this if you are a current or former smoker.  Osteoporosis. You may be screened starting at age 78 if you are at high risk. Talk with your health care provider about your test results, treatment options, and if necessary, the need for more tests. Vaccines  Your health care  provider may recommend certain vaccines, such as:  Influenza vaccine. This is recommended every year.  Tetanus, diphtheria, and acellular pertussis (Tdap, Td) vaccine. You may need a Td booster every 10 years.  Zoster vaccine. You may need this after age  8.  Pneumococcal 13-valent conjugate (PCV13) vaccine. One dose is recommended after age 63.  Pneumococcal polysaccharide (PPSV23) vaccine. One dose is recommended after age 31. Talk to your health care provider about which screenings and vaccines you need and how often you need them. This information is not intended to replace advice given to you by your health care provider. Make sure you discuss any questions you have with your health care provider. Document Released: 11/11/2015 Document Revised: 07/04/2016 Document Reviewed: 08/16/2015 Elsevier Interactive Patient Education  2017 [employer] Prevention in the Home Falls can cause injuries. They can happen to people of all ages. There are many things you can do to make your home safe and to help prevent falls. What can I do on the outside of my home?  Regularly fix the edges of walkways and driveways and fix any cracks.  Remove anything that might make you trip as you walk through a door, such as a raised step or threshold.  Trim any bushes or trees on the path to your home.  Use bright outdoor lighting.  Clear any walking paths of anything that might make someone trip, such as rocks or tools.  Regularly check to see if handrails are loose or broken. Make sure that both sides of any steps have handrails.  Any raised decks and porches should have guardrails on the edges.  Have any leaves, snow, or ice cleared regularly.  Use sand or salt on walking paths during winter.  Clean up any spills in your garage right away. This includes oil or grease spills. What can I do in the bathroom?  Use night lights.  Install grab bars by the toilet and in the tub and shower. Do not use towel bars as grab bars.  Use non-skid mats or decals in the tub or shower.  If you need to sit down in the shower, use a plastic, non-slip stool.  Keep the floor dry. Clean up any water that spills on the floor as soon as it happens.  Remove  soap buildup in the tub or shower regularly.  Attach bath mats securely with double-sided non-slip rug tape.  Do not have throw rugs and other things on the floor that can make you trip. What can I do in the bedroom?  Use night lights.  Make sure that you have a light by your bed that is easy to reach.  Do not use any sheets or blankets that are too big for your bed. They should not hang down onto the floor.  Have a firm chair that has side arms. You can use this for support while you get dressed.  Do not have throw rugs and other things on the floor that can make you trip. What can I do in the kitchen?  Clean up any spills right away.  Avoid walking on wet floors.  Keep items that you use a lot in easy-to-reach places.  If you need to reach something above you, use a strong step stool that has a grab bar.  Keep electrical cords out of the way.  Do not use floor polish or wax that makes floors slippery. If you must use wax, use non-skid floor wax.  Do not have throw  rugs and other things on the floor that can make you trip. What can I do with my stairs?  Do not leave any items on the stairs.  Make sure that there are handrails on both sides of the stairs and use them. Fix handrails that are broken or loose. Make sure that handrails are as long as the stairways.  Check any carpeting to make sure that it is firmly attached to the stairs. Fix any carpet that is loose or worn.  Avoid having throw rugs at the top or bottom of the stairs. If you do have throw rugs, attach them to the floor with carpet tape.  Make sure that you have a light switch at the top of the stairs and the bottom of the stairs. If you do not have them, ask someone to add them for you. What else can I do to help prevent falls?  Wear shoes that:  Do not have high heels.  Have rubber bottoms.  Are comfortable and fit you well.  Are closed at the toe. Do not wear sandals.  If you use a  stepladder:  Make sure that it is fully opened. Do not climb a closed stepladder.  Make sure that both sides of the stepladder are locked into place.  Ask someone to hold it for you, if possible.  Clearly mark and make sure that you can see:  Any grab bars or handrails.  First and last steps.  Where the edge of each step is.  Use tools that help you move around (mobility aids) if they are needed. These include:  Canes.  Walkers.  Scooters.  Crutches.  Turn on the lights when you go into a dark area. Replace any light bulbs as soon as they burn out.  Set up your furniture so you have a clear path. Avoid moving your furniture around.  If any of your floors are uneven, fix them.  If there are any pets around you, be aware of where they are.  Review your medicines with your doctor. Some medicines can make you feel dizzy. This can increase your chance of falling. Ask your doctor what other things that you can do to help prevent falls. This information is not intended to replace advice given to you by your health care provider. Make sure you discuss any questions you have with your health care provider. Document Released: 08/11/2009 Document Revised: 03/22/2016 Document Reviewed: 11/19/2014 Elsevier Interactive Patient Education  2017 [employer].

## 2020-12-13 ENCOUNTER — Encounter: Payer: Self-pay | Admitting: Family Medicine

## 2020-12-14 NOTE — Telephone Encounter (Signed)
Pt aware vaccine up to date  Need TDAP and Covid buster  Patient verbalized understanding

## 2021-03-20 ENCOUNTER — Ambulatory Visit: Payer: Medicare Other | Admitting: Cardiovascular Disease

## 2021-03-20 ENCOUNTER — Other Ambulatory Visit: Payer: Self-pay

## 2021-03-20 ENCOUNTER — Encounter: Payer: Self-pay | Admitting: Cardiovascular Disease

## 2021-03-20 ENCOUNTER — Other Ambulatory Visit: Payer: Self-pay | Admitting: Cardiovascular Disease

## 2021-03-20 VITALS — BP 122/70 | HR 56 | Ht 67.0 in | Wt 154.8 lb

## 2021-03-20 DIAGNOSIS — I2510 Atherosclerotic heart disease of native coronary artery without angina pectoris: Secondary | ICD-10-CM | POA: Diagnosis not present

## 2021-03-20 DIAGNOSIS — E7800 Pure hypercholesterolemia, unspecified: Secondary | ICD-10-CM | POA: Diagnosis not present

## 2021-03-20 NOTE — Patient Instructions (Signed)
Medication Instructions:  No changes *If you need a refill on your cardiac medications before your next appointment, please call your pharmacy*   Lab Work: none If you have labs (blood work) drawn today and your tests are completely normal, you will receive your results only by: Marland Kitchen MyChart Message (if you have MyChart) OR . A paper copy in the mail If you have any lab test that is abnormal or we need to change your treatment, we will call you to review the results.   Testing/Procedures: Your physician has requested that you have an echocardiogram. Echocardiography is a painless test that uses sound waves to create images of your heart. It provides your doctor with information about the size and shape of your heart and how well your heart's chambers and valves are working. This procedure takes approximately one hour. There are no restrictions for this procedure.     Follow-Up: At [facility], you and your health needs are our priority.  As part of our continuing mission to provide you with exceptional heart care, we have created designated Provider Care Teams.  These Care Teams include your primary Cardiologist (physician) and Advanced Practice Providers (APPs -  Physician Assistants and Nurse Practitioners) who all work together to provide you with the care you need, when you need it.   Your next appointment:   2 year(s)  The format for your next appointment:   In Person  Provider:   You may see Verne Carrow, MD or one of the following Advanced Practice Providers on your designated Care Team:    Ronie Spies, PA-C  Jacolyn Reedy, PA-C

## 2021-03-20 NOTE — Progress Notes (Signed)
Chief Complaint  Patient presents with  . Follow-up    CAD    History of Present Illness: 75 yo male with history of HLD, mild CAD and pericarditis here today for follow up. He was admitted to [facility] in January 2017 with chest pain and acute ST changes. Emergent cardiac cath with mild disease mid RCA but no flow limiting lesions. Echo 10/31/15 with normal LV systolic function, moderate LVH, trivial AI. He was treated with an NSAID for possible acute pericarditis. He has not tolerated high dose statins but has tolerated low dose Crestor recently.   He is here today for follow up. The patient denies any chest pain, dyspnea, palpitations, lower extremity edema, orthopnea, PND, dizziness, near syncope or syncope. He runs up to 15 miles per week.   Primary Care Physician: Ardith Dark, MD  Past Medical History:  Diagnosis Date  . Acute pericarditis    a. admitted as a code STEMI but no sig CAD on cath.   . CAD (coronary artery disease)    30% stenosis mid RCA  . Colonic polyp    Dr. Kinnie Scales; hyperplastic polyp  . Gilbert syndrome   . Hyperlipidemia     Past Surgical History:  Procedure Laterality Date  . actinic keratoses     facial removed x 2, Dr Danella Deis  . basal cell cancer  2013 & 2015   Dr Campbell Stall  . CARDIAC CATHETERIZATION N/A 10/30/2015   Procedure: Left Heart Cath and Coronary Angiography;  Surgeon: Kathleene Hazel, MD;  Location: [facility] INVASIVE CV LAB;  Service: Cardiovascular;  Laterality: N/A;  . COLONOSCOPY  2014   negative  . COLONOSCOPY W/ POLYPECTOMY  2004   @ age 84, Dr Kinnie Scales. ? Hyperplastic polyp  . FLEXIBLE SIGMOIDOSCOPY  1999   negative  . KNEE ARTHROSCOPY  1984   right    Current Outpatient Medications  Medication Sig Dispense Refill  . Ascorbic Acid (VITA-C PO) Take 1 tablet by mouth daily.    Marland Kitchen aspirin 81 MG tablet Take 81 mg by mouth daily.      . Cholecalciferol (VITAMIN D) 2000 UNITS CAPS Take 2,000 Units by mouth daily.     . fish  oil-omega-3 fatty acids 1000 MG capsule Take 1 g by mouth daily.     . Magnesium 250 MG TABS Take 250 mg by mouth daily.     . metroNIDAZOLE (METROCREAM) 0.75 % cream Apply 1 application topically as needed.    . Multiple Vitamins-Minerals (MENS 50+ MULTI VITAMIN/MIN PO) Take 1 tablet daily by mouth.    . NON FORMULARY     . pantoprazole (PROTONIX) 40 MG tablet TAKE 1 TABLET(40 MG) BY MOUTH DAILY 90 tablet 1  . rosuvastatin (CRESTOR) 5 MG tablet Take 1 tablet (5 mg total) by mouth 3 (three) times a week. 45 tablet 3  . Turmeric (QC TUMERIC COMPLEX PO) Take by mouth.    . Zinc 25 MG TABS Take 1 tablet by mouth daily.     No current facility-administered medications for this visit.    Allergies  Allergen Reactions  . Codeine Anaphylaxis    Mental status changes  . Niacin Other (See Comments)    flushing    Social History   Socioeconomic History  . Marital status: Married    Spouse name: Not on file  . Number of children: Not on file  . Years of education: Not on file  . Highest education level: Not on file  Occupational  History  . Occupation: retired   Tobacco Use  . Smoking status: Former Smoker    Packs/day: 1.00    Years: 13.00    Pack years: 13.00    Types: Cigarettes    Quit date: 10/29/1978    Years since quitting: 42.4  . Smokeless tobacco: Never Used  . Tobacco comment: smoked 1967-1980, up to 1 ppd  Vaping Use  . Vaping Use: Never used  Substance and Sexual Activity  . Alcohol use: No    Alcohol/week: 0.0 standard drinks  . Drug use: No  . Sexual activity: Not on file  Other Topics Concern  . Not on file  Social History Narrative   Originally from [place]. Moved to [place] in 1951. He moved to [place] in 1984. 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 Previously has traveled to [place], [place], [place], [place], [place], [place] and [place]. Normally has a cockatiel but not currently No mold exposure. Has a hot tub but  doesn't use it regularly. No children    Social Determinants of Health   Financial Resource Strain: Low Risk   . Difficulty of Paying Living Expenses: Not hard at all  Food Insecurity: No Food Insecurity  . Worried About Programme researcher, broadcasting/film/videounning Out of Food in the Last Year: Never true  . Ran Out of Food in the Last Year: Never true  Transportation Needs: No Transportation Needs  . Lack of Transportation (Medical): No  . Lack of Transportation (Non-Medical): No  Physical Activity: Sufficiently Active  . Days of Exercise per Week: 5 days  . Minutes of Exercise per Session: 90 min  Stress: No Stress Concern Present  . Feeling of Stress : Not at all  Social Connections: Socially Integrated  . Frequency of Communication with Friends and Family: More than three times a week  . Frequency of Social Gatherings with Friends and Family: Once a week  . Attends Religious Services: More than 4 times per year  . Active Member of Clubs or Organizations: Yes  . Attends BankerClub or Organization Meetings: 1 to 4 times per year  . Marital Status: Married  Catering managerntimate Partner Violence: Not At Risk  . Fear of Current or Ex-Partner: No  . Emotionally Abused: No  . Physically Abused: No  . Sexually Abused: No    Family History  Problem Relation Age of Onset  . Stroke Father 8061  . Alcohol abuse Father   . Asthma Brother   . Breast cancer Mother   . HIV Brother   . Prostate cancer Brother [id number]  . Breast cancer Maternal Aunt   . Breast cancer Maternal Grandmother   . Heart disease Neg Hx   . Diabetes Neg Hx     Review of Systems:  As stated in the HPI and otherwise negative.   BP 122/70   Pulse (!) 56   Ht 5\' 7"  (1.702 m)   Wt 154 lb 12.8 oz (70.2 kg)   SpO2 98%   BMI 24.25 kg/m   Physical Examination: General: Well developed, well nourished, NAD  HEENT: OP clear, mucus membranes moist  SKIN: warm, dry. No rashes. Neuro: No focal deficits  Musculoskeletal: Muscle strength 5/5 all ext  Psychiatric: Mood and  affect normal  Neck: No JVD, no carotid bruits, no thyromegaly, no lymphadenopathy.  Lungs:Clear bilaterally, no wheezes, rhonci, crackles Cardiovascular: Regular rate and rhythm. No murmurs, gallops or rubs. Abdomen:Soft. Bowel sounds present. Non-tender.  Extremities: No lower extremity edema. Pulses are 2 +  in the bilateral DP/PT.  Cardiac cath 10/30/15: Coronary Findings    Dominance: Right   Left Anterior Descending  . Vessel is large. Vessel is angiographically normal.   . First Diagonal Branch   The vessel is moderate in size and is angiographically normal.   . Second Diagonal Branch   The vessel is small in size.     Left Circumflex  . Vessel is moderate in size. Vessel is angiographically normal.   . First Obtuse Marginal Branch   The vessel is moderate in size and is angiographically normal.     Right Coronary Artery  . Vessel is large.   . Mid RCA lesion, 30% stenosed. Discrete.   . Right Posterior Descending Artery   The vessel is moderate in size.      Wall Motion                 Coronary Diagrams    Diagnostic Diagram         Echo 10/31/15: Left ventricle: The cavity size was normal. Wall thickness was increased in a pattern of moderate LVH. Systolic function was normal. The estimated ejection fraction was in the range of 60% to 65%. GLPSS -19% - normal. Wall motion was normal; there were no regional wall motion abnormalities. Doppler parameters are consistent with abnormal left ventricular relaxation (grade 1 diastolic dysfunction). The E/e&' ratio is <8, suggesting normal LV filling pressure. - Aortic valve: Trileaflet. Sclerosis without stenosis. There was trivial regurgitation. - Mitral valve: Mildly thickened leaflets . - Left atrium: The atrium was normal in size. - Right atrium: The atrium was mildly dilated. - Inferior vena cava: The vessel was normal in size. The respirophasic diameter changes were in the normal  range (>= 50%), consistent with normal central venous pressure.  Impressions:  - LVEF 60-65%, moderate LVH, normal wall motion, normal GLPSS, diastolic dysfunction by TDI with normal LV filling pressure, trivial AI, mild RAE.  EKG:  EKG is ordered today. The ekg ordered today demonstrates Sinus bradycardia  Recent Labs: 09/20/2020: ALT 17; BUN 22; Creat 1.00; Potassium 5.1; Sodium 138; TSH 3.32 10/19/2020: Hemoglobin 12.6; Platelets 299   Lipid Panel    Component Value Date/Time   CHOL 180 09/20/2020 1011   CHOL 164 03/26/2018 0919   TRIG 73 09/20/2020 1011   HDL 55 09/20/2020 1011   HDL 45 03/26/2018 0919   CHOLHDL 3.3 09/20/2020 1011   VLDL 11.0 09/14/2019 1001   LDLCALC 109 (H) 09/20/2020 1011   LDLDIRECT 159.2 08/31/2013 0946     Wt Readings from Last 3 Encounters:  03/20/21 154 lb 12.8 oz (70.2 kg)  12/12/20 155 lb 9.6 oz (70.6 kg)  09/20/20 149 lb (67.6 kg)     Other studies Reviewed: Additional studies/ records that were reviewed today include: . Review of the above records demonstrates:   Assessment and Plan:   1. CAD without angina: He has mild disease in the mid RCA by cath in 2017. He has no chest pain. Continue ASA and statin. Repeat echo now to assess LVEF.   2. Hyperlipidemia: Lipids followed in primary care. LDL not at goal. He does not tolerate higher doses of statins. He has been taking Crestor. He was seen in the lipid clinic in our office in April 2021 and Repatha discussed but he chose not to start this.    Current medicines are reviewed at length with the patient today.  The patient does not have concerns regarding medicines.  The following changes  have been made:  no change  Labs/ tests ordered today include:   Orders Placed This Encounter  Procedures  . EKG 12-Lead  . ECHOCARDIOGRAM COMPLETE    Disposition:   FU with me  in 12  months  Signed, Verne Carrow, MD 03/20/2021 12:12 PM    [facility]  [facility] [address] [place], [place], [place]  [address] Phone: [phone]; Fax: [phone]

## 2021-04-18 ENCOUNTER — Ambulatory Visit (HOSPITAL_COMMUNITY): Payer: Medicare Other | Attending: Cardiovascular Disease

## 2021-04-18 ENCOUNTER — Other Ambulatory Visit: Payer: Self-pay

## 2021-04-18 DIAGNOSIS — I2510 Atherosclerotic heart disease of native coronary artery without angina pectoris: Secondary | ICD-10-CM | POA: Diagnosis present

## 2021-04-18 LAB — ECHOCARDIOGRAM COMPLETE
Area-P 1/2: 1.66 cm2
S' Lateral: 3.4 cm

## 2021-05-11 ENCOUNTER — Other Ambulatory Visit: Payer: Self-pay | Admitting: Family Medicine

## 2021-05-30 ENCOUNTER — Other Ambulatory Visit: Payer: Self-pay

## 2021-05-30 ENCOUNTER — Ambulatory Visit: Payer: Medicare Other | Admitting: Sports Medicine

## 2021-05-30 VITALS — BP 122/78 | Ht 67.0 in | Wt 152.0 lb

## 2021-05-30 DIAGNOSIS — M25562 Pain in left knee: Secondary | ICD-10-CM | POA: Diagnosis not present

## 2021-05-30 NOTE — Progress Notes (Signed)
   Subjective:    Patient ID: Trevor [name], male    DOB: [birth year], 75 y.o.   MRN: [id number]  HPI chief complaint: Left knee pain  Trevor [name] comes in today complaining of left knee pain that began a few days ago without any injury.  He has had issues with this knee in the past.  An MRI of his left knee done in March 2020 showed no significant generative changes.  However he did have a small joint effusion.  Subsequent cortisone injection in December of that same year resulted in good pain relief.  For the most part, he has been doing well since then.  He did have another flareup a few months later which resolved with conservative treatment.  He continues to run.  He has recently introduced more speed work into his weekly workouts.  His pain is minimal and present primarily after running.  He localizes his pain to the anterior knee.  It is somewhat achy at night.  Interim medical history reviewed Medications reviewed Allergies reviewed    Review of Systems As above    Objective:   Physical Exam  Well-developed, well-nourished.  No acute distress  Left knee: Full range of motion.  No obvious effusion.  No tenderness to palpation along the patellar or quadriceps tendons.  No joint line tenderness.  Negative McMurray's.  Knee is grossly stable to ligamentous exam.  Limited MSK ultrasound of the left knee shows slight amount of edema in the distal patellar tendon but a small joint effusion as well.      Assessment & Plan:   Left knee pain likely secondary to small intra-articular effusion  Clinically I think his symptoms are originating from the small effusion seen on the ultrasound.  Previous MRI did not show much in the way of degenerative changes.  His symptoms are currently tolerable so we will treat with compression when exercising and he will resume his home exercise program 3-4 times a week.  I did recommend that he avoid running tomorrow but may resume running the following day  if his symptoms allow.  If symptoms do not improve then he will return to the office for a repeat cortisone injection.  Follow-up for ongoing or recalcitrant issues.

## 2021-09-05 ENCOUNTER — Ambulatory Visit: Payer: Medicare Other | Admitting: Sports Medicine

## 2021-09-27 ENCOUNTER — Other Ambulatory Visit: Payer: Self-pay

## 2021-09-27 ENCOUNTER — Ambulatory Visit (INDEPENDENT_AMBULATORY_CARE_PROVIDER_SITE_OTHER): Payer: Medicare Other | Admitting: Family Medicine

## 2021-09-27 ENCOUNTER — Encounter: Payer: Self-pay | Admitting: Family Medicine

## 2021-09-27 VITALS — BP 122/70 | HR 62 | Temp 98.0°F | Ht 67.0 in | Wt 149.0 lb

## 2021-09-27 DIAGNOSIS — Z0001 Encounter for general adult medical examination with abnormal findings: Secondary | ICD-10-CM | POA: Diagnosis not present

## 2021-09-27 DIAGNOSIS — R739 Hyperglycemia, unspecified: Secondary | ICD-10-CM

## 2021-09-27 DIAGNOSIS — E785 Hyperlipidemia, unspecified: Secondary | ICD-10-CM

## 2021-09-27 DIAGNOSIS — N400 Enlarged prostate without lower urinary tract symptoms: Secondary | ICD-10-CM | POA: Diagnosis not present

## 2021-09-27 LAB — CBC
HCT: 37 % — ABNORMAL LOW (ref 39.0–52.0)
Hemoglobin: 12.4 g/dL — ABNORMAL LOW (ref 13.0–17.0)
MCHC: 33.5 g/dL (ref 30.0–36.0)
MCV: 93.3 fl (ref 78.0–100.0)
Platelets: 225 10*3/uL (ref 150.0–400.0)
RBC: 3.97 Mil/uL — ABNORMAL LOW (ref 4.22–5.81)
RDW: 13.5 % (ref 11.5–15.5)
WBC: 5.7 10*3/uL (ref 4.0–10.5)

## 2021-09-27 LAB — LIPID PANEL
Cholesterol: 174 mg/dL (ref 0–200)
HDL: 53.7 mg/dL (ref >39.00)
LDL Cholesterol: 105 mg/dL — ABNORMAL HIGH (ref 0–99)
NonHDL: 120.16
Total CHOL/HDL Ratio: 3
Triglycerides: 75 mg/dL (ref 0.0–149.0)
VLDL: 15 mg/dL (ref 0.0–40.0)

## 2021-09-27 LAB — HEMOGLOBIN A1C: Hgb A1c MFr Bld: 6.1 % (ref 4.6–6.5)

## 2021-09-27 LAB — COMPREHENSIVE METABOLIC PANEL
ALT: 15 U/L (ref 0–53)
AST: 25 U/L (ref 0–37)
Albumin: 4.5 g/dL (ref 3.5–5.2)
Alkaline Phosphatase: 46 U/L (ref 39–117)
BUN: 21 mg/dL (ref 6–23)
CO2: 28 mEq/L (ref 19–32)
Calcium: 9.2 mg/dL (ref 8.4–10.5)
Chloride: 102 mEq/L (ref 96–112)
Creatinine, Ser: 0.99 mg/dL (ref 0.40–1.50)
GFR: 75.48 mL/min (ref >60.00)
Glucose, Bld: 87 mg/dL (ref 70–99)
Potassium: 4.6 mEq/L (ref 3.5–5.1)
Sodium: 138 mEq/L (ref 135–145)
Total Bilirubin: 1.7 mg/dL — ABNORMAL HIGH (ref 0.2–1.2)
Total Protein: 6.8 g/dL (ref 6.0–8.3)

## 2021-09-27 LAB — PSA: PSA: 1.15 ng/mL (ref 0.10–4.00)

## 2021-09-27 LAB — TSH: TSH: 2.05 u[IU]/mL (ref 0.35–5.50)

## 2021-09-27 NOTE — Assessment & Plan Note (Signed)
Stable.  Check PSA. 

## 2021-09-27 NOTE — Progress Notes (Signed)
Chief Complaint:  Trevor [name] is a 75 y.o. male who presents today for his annual comprehensive physical exam.    Assessment/Plan:  Chronic Problems Addressed Today: BPH without obstruction/lower urinary tract symptoms Stable. Check PSA.  Hyperlipidemia with target LDL less than 70 Check lipids.  On Crestor 5 mg 3 times weekly.  Cannot tolerate higher doses.  Hyperglycemia Check A1c.  Preventative Healthcare: Discussed flu vaccine.  He has up-to-date on shingles and pneumonia vaccine.  Up-to-date on colon cancer screening.  Check labs today.  Patient Counseling(The following topics were reviewed and/or handout was given):  -Nutrition: Stressed importance of moderation in sodium/caffeine intake, saturated fat and cholesterol, caloric balance, sufficient intake of fresh fruits, vegetables, and fiber.  -Stressed the importance of regular exercise.   -Substance Abuse: Discussed cessation/primary prevention of tobacco, alcohol, or other drug use; driving or other dangerous activities under the influence; availability of treatment for abuse.   -Injury prevention: Discussed safety belts, safety helmets, smoke detector, smoking near bedding or upholstery.   -Sexuality: Discussed sexually transmitted diseases, partner selection, use of condoms, avoidance of unintended pregnancy and contraceptive alternatives.   -Dental health: Discussed importance of regular tooth brushing, flossing, and dental visits.  -Health maintenance and immunizations reviewed. Please refer to Health maintenance section.  Return to care in 1 year for next preventative visit.     Subjective:  HPI:  He has no acute complaints today. See A/p for status of chronic conditions.  Lifestyle Diet: Balanced. Plenty of fruits and vegetables.  Exercise: Likes to run.   Depression screen [facility] 2/9 12/12/2020  Decreased Interest 0  Down, Depressed, Hopeless 0  PHQ - 2 Score 0  Altered sleeping -  Tired, decreased  energy -  Change in appetite -  Feeling bad or failure about yourself  -  Trouble concentrating -  Moving slowly or fidgety/restless -  Suicidal thoughts -  PHQ-9 Score -    Health Maintenance Due  Topic Date Due   TETANUS/TDAP  09/09/2018   COVID-19 Vaccine (3 - Moderna risk series) 11/03/2020     ROS: Per HPI, otherwise a complete review of systems was negative.   PMH:  The following were reviewed and entered/updated in epic: Past Medical History:  Diagnosis Date   Acute pericarditis    a. admitted as a code STEMI but no sig CAD on cath.    CAD (coronary artery disease)    30% stenosis mid RCA   Colonic polyp    Dr. Kinnie Scales; hyperplastic polyp   [name]    Hyperlipidemia    Patient Active Problem List   Diagnosis Date Noted   Hyperglycemia 09/20/2020   Gastroesophageal reflux disease without esophagitis 09/03/2016   Exercise-induced asthma 03/14/2016   CAD (coronary artery disease)    [name] 09/06/2014   History of basal cell cancer 08/31/2013   Hyperplastic colonic polyp 09/09/2008   Hyperlipidemia with target LDL less than 70 09/01/2007   BPH without obstruction/lower urinary tract symptoms 09/01/2007   Past Surgical History:  Procedure Laterality Date   actinic keratoses     facial removed x 2, Dr Danella Deis   basal cell cancer  2013 & 2015   Dr Campbell Stall   CARDIAC CATHETERIZATION N/A 10/30/2015   Procedure: Left Heart Cath and Coronary Angiography;  Surgeon: Kathleene Hazel, MD;  Location: [facility] INVASIVE CV LAB;  Service: Cardiovascular;  Laterality: N/A;   COLONOSCOPY  2014   negative   COLONOSCOPY W/ POLYPECTOMY  2004   @  age 49, Dr Kinnie Scales. ? Hyperplastic polyp   FLEXIBLE SIGMOIDOSCOPY  1999   negative   KNEE ARTHROSCOPY  1984   right    Family History  Problem Relation Age of Onset   Stroke Father 14   Alcohol abuse Father    Asthma Brother    Breast cancer Mother    HIV Brother    Prostate cancer Brother 31   Breast  cancer Maternal Aunt    Breast cancer Maternal Grandmother    Heart disease Neg Hx    Diabetes Neg Hx     Medications- reviewed and updated Current Outpatient Medications  Medication Sig Dispense Refill   Ascorbic Acid (VITA-C PO) Take 1 tablet by mouth daily.     aspirin 81 MG tablet Take 81 mg by mouth daily.       Cholecalciferol (VITAMIN D) 2000 UNITS CAPS Take 2,000 Units by mouth daily.      fish oil-omega-3 fatty acids 1000 MG capsule Take 1 g by mouth daily.      Magnesium 250 MG TABS Take 250 mg by mouth daily.      metroNIDAZOLE (METROCREAM) 0.75 % cream Apply 1 application topically as needed.     Multiple Vitamins-Minerals (MENS 50+ MULTI VITAMIN/MIN PO) Take 1 tablet daily by mouth.     NON FORMULARY      pantoprazole (PROTONIX) 40 MG tablet TAKE 1 TABLET(40 MG) BY MOUTH DAILY 90 tablet 1   rosuvastatin (CRESTOR) 5 MG tablet TAKE 1 TABLET BY MOUTH THREE TIMES A WEEK 45 tablet 3   Turmeric (QC TUMERIC COMPLEX PO) Take by mouth.     Zinc 25 MG TABS Take 1 tablet by mouth daily.     No current facility-administered medications for this visit.    Allergies-reviewed and updated Allergies  Allergen Reactions   Codeine Anaphylaxis    Mental status changes   Niacin Other (See Comments)    flushing    Social History   Socioeconomic History   Marital status: Married    Spouse name: Not on file   Number of children: Not on file   Years of education: Not on file   Highest education level: Not on file  Occupational History   Occupation: retired   Tobacco Use   Smoking status: Former    Packs/day: 1.00    Years: 13.00    Pack years: 13.00    Types: Cigarettes    Quit date: 10/29/1978    Years since quitting: 42.9   Smokeless tobacco: Never   Tobacco comments:    smoked 1967-1980, up to 1 ppd  Vaping Use   Vaping Use: Never used  Substance and Sexual Activity   Alcohol use: No    Alcohol/week: 0.0 standard drinks   Drug use: No   Sexual activity: Not on file   Other Topics Concern   Not on file  Social History Narrative   Originally from [place]. Moved to [place] in 1951. He moved to [place] in 1984. 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 Previously has traveled to [place], [place], [place], [place], [place], and [place]. Normally has a cockatiel but not currently No mold exposure. Has a hot tub but doesn't use it regularly. No children    Social Determinants of Health   Financial Resource Strain: Low Risk    Difficulty of Paying Living Expenses: Not hard at all  Food Insecurity: No Food Insecurity   Worried About Running Out of  Food in the Last Year: Never true   Ran Out of Food in the Last Year: Never true  Transportation Needs: No Transportation Needs   Lack of Transportation (Medical): No   Lack of Transportation (Non-Medical): No  Physical Activity: Sufficiently Active   Days of Exercise per Week: 5 days   Minutes of Exercise per Session: 90 min  Stress: No Stress Concern Present   Feeling of Stress : Not at all  Social Connections: Socially Integrated   Frequency of Communication with Friends and Family: More than three times a week   Frequency of Social Gatherings with Friends and Family: Once a week   Attends Religious Services: More than 4 times per year   Active Member of [employer] or Organizations: Yes   Attends Banker Meetings: 1 to 4 times per year   Marital Status: Married        Objective:  Physical Exam: BP 122/70   Pulse 62   Temp 98 F (36.7 C) (Temporal)   Ht 5\' 7"  (1.702 m)   Wt 149 lb (67.6 kg)   SpO2 96%   BMI 23.34 kg/m   Body mass index is 23.34 kg/m. Wt Readings from Last 3 Encounters:  09/27/21 149 lb (67.6 kg)  05/30/21 152 lb (68.9 kg)  03/20/21 154 lb 12.8 oz (70.2 kg)   Gen: NAD, resting comfortably HEENT: TMs normal bilaterally. OP clear. No thyromegaly noted.  CV: RRR with no murmurs appreciated Pulm: NWOB, CTAB with no crackles,  wheezes, or rhonchi GI: Normal bowel sounds present. Soft, Nontender, Nondistended. MSK: no edema, cyanosis, or clubbing noted Skin: warm, dry Neuro: CN2-12 grossly intact. Strength 5/5 in upper and lower extremities. Reflexes symmetric and intact bilaterally.  Psych: Normal affect and thought content     Alta Goding M. 03/22/21, MD 09/27/2021 10:23 AM

## 2021-09-27 NOTE — Assessment & Plan Note (Signed)
Check A1c. 

## 2021-09-27 NOTE — Assessment & Plan Note (Signed)
Check lipids.  On Crestor 5 mg 3 times weekly.  Cannot tolerate higher doses.

## 2021-09-27 NOTE — Patient Instructions (Signed)
It was very nice to see you today!  We will check blood work today.  Please continue working on diet and exercise.  I will see back in year for your next physical.  Come back sooner if needed.  Take care, Dr [name]  PLEASE NOTE:  If you had any lab tests please let us know if you have not heard back within a few days. You may see your results on mychart before we have a chance to review them but we will give you a call once they are reviewed by [place]. If we ordered any referrals today, please let us know if you have not heard from their office within the next week.   Please try these tips to maintain a healthy lifestyle:  Eat at least 3 REAL meals and 1-2 snacks per day.  Aim for no more than 5 hours between eating.  If you eat breakfast, please do so within one hour of getting up.   Each meal should contain half fruits/vegetables, one quarter protein, and one quarter carbs (no bigger than a computer mouse)  Cut down on sweet beverages. This includes juice, soda, and sweet tea.   Drink at least 1 glass of water with each meal and aim for at least 8 glasses per day  Exercise at least 150 minutes every week.    Preventive Care 66 Years and Older, Male Preventive care refers to lifestyle choices and visits with your health care provider that can promote health and wellness. Preventive care visits are also called wellness exams. What can I expect for my preventive care visit? Counseling During your preventive care visit, your health care provider may ask about your: Medical history, including: Past medical problems. Family medical history. History of falls. Current health, including: Emotional well-being. Home life and relationship well-being. Sexual activity. Memory and ability to understand (cognition). Lifestyle, including: Alcohol, nicotine or tobacco, and drug use. Access to firearms. Diet, exercise, and sleep habits. Work and work Astronomer. Sunscreen use. Safety issues  such as seatbelt and bike helmet use. Physical exam Your health care provider will check your: Height and weight. These may be used to calculate your BMI (body mass index). BMI is a measurement that tells if you are at a healthy weight. Waist circumference. This measures the distance around your waistline. This measurement also tells if you are at a healthy weight and may help predict your risk of certain diseases, such as type 2 diabetes and high blood pressure. Heart rate and blood pressure. Body temperature. Skin for abnormal spots. What immunizations do I need? Vaccines are usually given at various ages, according to a schedule. Your health care provider will recommend vaccines for you based on your age, medical history, and lifestyle or other factors, such as travel or where you work. What tests do I need? Screening Your health care provider may recommend screening tests for certain conditions. This may include: Lipid and cholesterol levels. Diabetes screening. This is done by checking your blood sugar (glucose) after you have not eaten for a while (fasting). Hepatitis C test. Hepatitis B test. HIV (human immunodeficiency virus) test. STI (sexually transmitted infection) testing, if you are at risk. Lung cancer screening. Colorectal cancer screening. Prostate cancer screening. Abdominal aortic aneurysm (AAA) screening. You may need this if you are a current or former smoker. Talk with your health care provider about your test results, treatment options, and if necessary, the need for more tests. Follow these instructions at home: Eating and drinking  Eat a diet that includes fresh fruits and vegetables, whole grains, lean protein, and low-fat dairy products. Limit your intake of foods with high amounts of sugar, saturated fats, and salt. Take vitamin and mineral supplements as recommended by your health care provider. Do not drink alcohol if your health care provider tells you not  to drink. If you drink alcohol: Limit how much you have to 0-2 drinks a day. Know how much alcohol is in your drink. In the [place] one drink equals one 12 oz bottle of beer (355 mL), one 5 oz glass of wine (148 mL), or one 1 oz glass of hard liquor (44 mL). Lifestyle Brush your teeth every morning and night with fluoride toothpaste. Floss one time each day. Exercise for at least 30 minutes 5 or more days each week. Do not use any products that contain nicotine or tobacco. These products include cigarettes, chewing tobacco, and vaping devices, such as e-cigarettes. If you need help quitting, ask your health care provider. Do not use drugs. If you are sexually active, practice safe sex. Use a condom or other form of protection to prevent STIs. Take aspirin only as told by your health care provider. Make sure that you understand how much to take and what form to take. Work with your health care provider to find out whether it is safe and beneficial for you to take aspirin daily. Ask your health care provider if you need to take a cholesterol-lowering medicine (statin). Find healthy ways to manage stress, such as: Meditation, yoga, or listening to music. Journaling. Talking to a trusted person. Spending time with friends and family. Safety Always wear your seat belt while driving or riding in a vehicle. Do not drive: If you have been drinking alcohol. Do not ride with someone who has been drinking. When you are tired or distracted. While texting. If you have been using any mind-altering substances or drugs. Wear a helmet and other protective equipment during sports activities. If you have firearms in your house, make sure you follow all gun safety procedures. Minimize exposure to UV radiation to reduce your risk of skin cancer. What's next? Visit your health care provider once a year for an annual wellness visit. Ask your health care provider how often you should have your eyes and teeth  checked. Stay up to date on all vaccines. This information is not intended to replace advice given to you by your health care provider. Make sure you discuss any questions you have with your health care provider. Document Revised: 04/12/2021 Document Reviewed: 04/12/2021 Elsevier Patient Education  [place].

## 2021-09-28 NOTE — Progress Notes (Signed)
Please inform patient of the following:  His cholesterol and blood sugar are borderline but stable. Everything else is stable and we can recheck in a year.  Katina Degree. [name], MD 09/28/2021 2:38 PM

## 2021-11-07 ENCOUNTER — Other Ambulatory Visit: Payer: Self-pay | Admitting: Family Medicine

## 2021-11-16 ENCOUNTER — Ambulatory Visit: Payer: Medicare Other | Admitting: Sports Medicine

## 2021-11-16 VITALS — BP 136/44 | Ht 67.0 in | Wt 142.0 lb

## 2021-11-16 DIAGNOSIS — M25562 Pain in left knee: Secondary | ICD-10-CM | POA: Diagnosis not present

## 2021-11-16 NOTE — Progress Notes (Signed)
° °  Subjective:    Patient ID: Trevor [name], male    DOB: 11-19-1945, 76 y.o.   MRN: [id number]  HPI chief complaint: Left knee pain  Trevor [name] presents today with continuing intermittent left knee pain.  Has been seen previously in the office with a similar complaint.  An MRI of his knee in 2020 showed no significant degenerative changes but an ultrasound done in 2022 did show a small joint effusion.  He continues to complain of anterior knee pain that is worse after long runs.  He did a 10 mile run this past weekend and had pain afterwards.  No pain while running.  He took some Tylenol and his pain had resolved by the following day.  He then did a couple of shorter runs this past week with minimal pain.  He has not noticed any swelling.  His main concern is that he may be doing damage to his knee that may prevent him from being able to continue running.  He is planning on running his next half marathon in April.  He denies mechanical symptoms.  Interim medical history reviewed Medications reviewed Allergies reviewed    Review of Systems As above    Objective:   Physical Exam  Well-developed, well-nourished.  No acute distress  Left knee:  There is a 2 to 3 degree extension lag but this is bilateral.  Good flexion.  No effusion.  1+ patellofemoral crepitus and pain with Clark's testing.  No tenderness along medial or lateral joint lines.  Negative McMurray's.  Knee is grossly stable to ligamentous exam.      Assessment & Plan:   Left knee pain likely secondary to mild patellofemoral DJD  Trevor [name] was asking about the possibility of a repeat cortisone injection.  A single injection done in the past was very helpful.  However, today he is relatively pain-free.  We will plan on scheduling a follow-up visit early next week and he will do a long run this weekend and see how his knee reacts.  If he has pain as severe as last weekend then we will plan on doing a cortisone injection at that  visit.  However, if his pain is minimal that he may elect to defer a repeat cortisone injection to a later date.

## 2021-11-21 ENCOUNTER — Ambulatory Visit: Payer: Medicare Other | Admitting: Sports Medicine

## 2021-11-21 VITALS — BP 108/70 | Ht 67.0 in | Wt 150.0 lb

## 2021-11-21 DIAGNOSIS — M25562 Pain in left knee: Secondary | ICD-10-CM | POA: Diagnosis not present

## 2021-11-21 MED ORDER — METHYLPREDNISOLONE ACETATE 40 MG/ML IJ SUSP
40.0000 mg | Freq: Once | INTRAMUSCULAR | Status: AC
  Administered 2021-11-21: 12:00:00 40 mg via INTRA_ARTICULAR

## 2021-11-21 NOTE — Addendum Note (Signed)
Addended by: Rutha Bouchard E on: 11/21/2021 12:09 PM   Modules accepted: Orders

## 2021-11-21 NOTE — Progress Notes (Signed)
Patient ID: Trevor [name], male   DOB: [birth year], 76 y.o.   MRN: [id number]  [name] presents today for a cortisone injection into his left knee.  Please see the office note from November 16, 2021 for details regarding history and physical exam findings.  Cortisone injection was administered as below.  He tolerated this without difficulty.  He will avoid running for the next 48 hours but then may increase activity thereafter.  If pain persists despite today's injection then consider updated imaging including x-rays and MRI.  Otherwise, follow-up as needed.  Consent obtained and verified. Time-out conducted. Noted no overlying erythema, induration, or other signs of local infection. Skin prepped in a sterile fashion. Topical analgesic spray: Ethyl chloride. Joint: Left knee Needle: 25-gauge 1.5 inch Completed without difficulty. Meds: 3 cc 1% Xylocaine, 1 cc (40 mg) Depo-Medrol  Advised to call if fevers/chills, erythema, induration, drainage, or persistent bleeding.

## 2021-11-27 ENCOUNTER — Encounter: Payer: Self-pay | Admitting: Sports Medicine

## 2021-12-21 ENCOUNTER — Other Ambulatory Visit: Payer: Self-pay

## 2021-12-21 ENCOUNTER — Ambulatory Visit (INDEPENDENT_AMBULATORY_CARE_PROVIDER_SITE_OTHER): Payer: Medicare Other

## 2021-12-21 DIAGNOSIS — Z0000 Encounter for general adult medical examination without abnormal findings: Secondary | ICD-10-CM

## 2021-12-21 NOTE — Progress Notes (Signed)
Virtual Visit via Telephone Note  I connected with  JOHNOTHAN [name] on 12/21/21 at 11:00 AM EST by telephone and verified that I am speaking with the correct person using two identifiers.  Medicare Annual Wellness visit completed telephonically due to Covid-19 pandemic.   Persons participating in this call: This Health Coach and this patient.   Location: Patient: Home Provider: Office    I discussed the limitations, risks, security and privacy concerns of performing an evaluation and management service by telephone and the availability of in person appointments. The patient expressed understanding and agreed to proceed.  Unable to perform video visit due to video visit attempted and failed and/or patient does not have video capability.   Some vital signs may be absent or patient reported.   [name], LPN   Subjective:   Trevor [name] is a 76 y.o. male who presents for Medicare Annual/Subsequent preventive examination.  Review of Systems     Cardiac Risk Factors include: advanced age (>15men, >73 women)     Objective:    There were no vitals filed for this visit. There is no height or weight on file to calculate BMI.  Advanced Directives 12/21/2021 12/12/2020 09/28/2019 09/22/2018 09/11/2017 07/03/2017 09/26/2015  Does Patient Have a Medical Advance Directive? Yes Yes Yes Yes Yes No No  Type of Advertising copywriter Living will;Healthcare Power of Attorney - - - -  Does patient want to make changes to medical advance directive? - - No - Patient declined - - - -  Copy of Healthcare Power of Attorney in Chart? No - copy requested No - copy requested No - copy requested - - - -  Would patient like information on creating a medical advance directive? - - - - - - No - patient declined information    Current Medications (verified) Outpatient Encounter Medications as of 12/21/2021  Medication Sig   Ascorbic Acid  (VITA-C PO) Take 1 tablet by mouth daily.   aspirin 81 MG tablet Take 81 mg by mouth daily.     Cholecalciferol (VITAMIN D) 2000 UNITS CAPS Take 2,000 Units by mouth daily.    fish oil-omega-3 fatty acids 1000 MG capsule Take 1 g by mouth daily.    Magnesium 250 MG TABS Take 250 mg by mouth daily.    metroNIDAZOLE (METROCREAM) 0.75 % cream Apply 1 application topically as needed.   Multiple Vitamins-Minerals (MENS 50+ MULTI VITAMIN/MIN PO) Take 1 tablet daily by mouth.   pantoprazole (PROTONIX) 40 MG tablet TAKE 1 TABLET(40 MG) BY MOUTH DAILY   rosuvastatin (CRESTOR) 5 MG tablet TAKE 1 TABLET BY MOUTH THREE TIMES A WEEK   Turmeric (QC TUMERIC COMPLEX PO) Take by mouth.   Zinc 25 MG TABS Take 1 tablet by mouth daily.   NON FORMULARY    No facility-administered encounter medications on file as of 12/21/2021.    Allergies (verified) Codeine and Niacin   History: Past Medical History:  Diagnosis Date   Acute pericarditis    a. admitted as a code STEMI but no sig CAD on cath.    CAD (coronary artery disease)    30% stenosis mid RCA   Colonic polyp    Dr. Kinnie Scales; hyperplastic polyp   [name]    Hyperlipidemia    Past Surgical History:  Procedure Laterality Date   actinic keratoses     facial removed x 2, Dr Danella Deis   basal cell cancer  2013 &  2015   Dr Campbell Stall   CARDIAC CATHETERIZATION N/A 10/30/2015   Procedure: Left Heart Cath and Coronary Angiography;  Surgeon: Kathleene Hazel, MD;  Location: [facility] INVASIVE CV LAB;  Service: Cardiovascular;  Laterality: N/A;   COLONOSCOPY  2014   negative   COLONOSCOPY W/ POLYPECTOMY  2004   @ age 8, Dr Kinnie Scales. ? Hyperplastic polyp   FLEXIBLE SIGMOIDOSCOPY  1999   negative   KNEE ARTHROSCOPY  1984   right   Family History  Problem Relation Age of Onset   Stroke Father 63   Alcohol abuse Father    Asthma Brother    Breast cancer Mother    HIV Brother    Prostate cancer Brother 68   Breast cancer Maternal Aunt     Breast cancer Maternal Grandmother    Heart disease Neg Hx    Diabetes Neg Hx    Social History   Socioeconomic History   Marital status: Married    Spouse name: Not on file   Number of children: Not on file   Years of education: Not on file   Highest education level: Not on file  Occupational History   Occupation: retired   Tobacco Use   Smoking status: Former    Packs/day: 1.00    Years: 13.00    Pack years: 13.00    Types: Cigarettes    Quit date: 10/29/1978    Years since quitting: 43.1   Smokeless tobacco: Never   Tobacco comments:    smoked 1967-1980, up to 1 ppd  Vaping Use   Vaping Use: Never used  Substance and Sexual Activity   Alcohol use: No    Alcohol/week: 0.0 standard drinks   Drug use: No   Sexual activity: Not on file  Other Topics Concern   Not on file  Social History Narrative   Originally from [place]. Moved to [place] in 1951. He moved to [place] in 1984. 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 Previously has traveled to [place], [place], [place], [place], [place], and [place]. Normally has a cockatiel but not currently No mold exposure. Has a hot tub but doesn't use it regularly. No children    Social Determinants of Corporate investment banker Strain: Low Risk    Difficulty of Paying Living Expenses: Not hard at all  Food Insecurity: No Food Insecurity   Worried About Programme researcher, broadcasting/film/video in the Last Year: Never true   Ran Out of Food in the Last Year: Never true  Transportation Needs: No Transportation Needs   Lack of Transportation (Medical): No   Lack of Transportation (Non-Medical): No  Physical Activity: Sufficiently Active   Days of Exercise per Week: 5 days   Minutes of Exercise per Session: 90 min  Stress: No Stress Concern Present   Feeling of Stress : Not at all  Social Connections: Socially Integrated   Frequency of Communication with Friends and Family: Three times a week   Frequency  of Social Gatherings with Friends and Family: More than three times a week   Attends Religious Services: More than 4 times per year   Active Member of Clubs or Organizations: Yes   Attends Banker Meetings: 1 to 4 times per year   Marital Status: Married    Tobacco Counseling Counseling given: Not Answered Tobacco comments: smoked 1967-1980, up to 1 ppd   Clinical Intake:  Pre-visit preparation completed: Yes  Pain : No/denies pain  Nutritional Risks: None Diabetes: No  How often do you need to have someone help you when you read instructions, pamphlets, or other written materials from your doctor or pharmacy?: 1 - Never  Diabetic?No  Interpreter Needed?: No  Information entered by :: Lanier Ensignina Marabelle Cushman, LPN   Activities of Daily Living In your present state of health, do you have any difficulty performing the following activities: 12/21/2021  Hearing? N  Vision? N  Difficulty concentrating or making decisions? N  Walking or climbing stairs? N  Dressing or bathing? N  Doing errands, shopping? N  Preparing Food and eating ? N  Using the Toilet? N  In the past six months, have you accidently leaked urine? N  Do you have problems with loss of bowel control? N  Managing your Medications? N  Managing your Finances? N  Housekeeping or managing your Housekeeping? N  Some recent data might be hidden    Patient Care Team: Ardith DarkParker, Caleb M, MD as PCP - General (Family Medicine) Kathleene HazelMcAlhany, Christopher D, MD as PCP - Cardiology (Cardiology) Estill Bambergumonski, Mark, MD as Consulting Physician (Orthopedic Surgery)  Indicate any recent Medical Services you may have received from other than [facility] providers in the past year (date may be approximate).     Assessment:   This is a routine wellness examination for [name].  Hearing/Vision screen Hearing Screening - Comments:: Pt denies any hearing issues  Vision Screening - Comments:: Pt follows up with provider [address] for annual eye exams   Dietary issues and exercise activities discussed: Current Exercise Habits: Home exercise routine, Type of exercise: Other - see comments   Goals Addressed             This Visit's Progress    Patient Stated       Continue staying active        Depression Screen PHQ 2/9 Scores 12/21/2021 12/12/2020 09/20/2020 09/14/2019 09/22/2018 09/11/2017 07/03/2017  PHQ - 2 Score 0 0 0 0 0 0 -  PHQ- 9 Score - - - 0 - - -  Exception Documentation - - - - - - Other- indicate reason in comment box    Fall Risk Fall Risk  12/21/2021 12/12/2020 09/28/2019 09/14/2019 09/22/2018  Falls in the past year? 0 0 0 0 0  Number falls in past yr: 0 0 - - -  Injury with Fall? 0 0 0 - -  Risk for fall due to : Impaired vision Impaired vision - - -  Follow up Falls prevention discussed Falls prevention discussed Falls evaluation completed;Falls prevention discussed;Education provided - -    FALL RISK PREVENTION PERTAINING TO THE HOME:  Any stairs in or around the home? Yes  If so, are there any without handrails? No  Home free of loose throw rugs in walkways, pet beds, electrical cords, etc? Yes  Adequate lighting in your home to reduce risk of falls? Yes   ASSISTIVE DEVICES UTILIZED TO PREVENT FALLS:  Life alert? No  Use of a cane, walker or w/c? No  Grab bars in the bathroom? No  Shower chair or bench in shower? Yes  Elevated toilet seat or a handicapped toilet? No   TIMED UP AND GO:  Was the test performed? No .  Cognitive Function: MMSE - Mini Mental State Exam 09/22/2018 09/11/2017  Not completed: (No Data) (No Data)        Immunizations Immunization History  Administered Date(s) Administered   Moderna Sars-Covid-2 Vaccination 09/08/2020, 10/06/2020   Pneumococcal Conjugate-13 10/21/2015  Pneumococcal Polysaccharide-23 06/13/2017   Td 09/09/2008   Zoster Recombinat (Shingrix) 09/21/2019, 11/24/2019   Zoster, Live 09/17/2014    TDAP status: Due,  Education has been provided regarding the importance of this vaccine. Advised may receive this vaccine at local pharmacy or Health Dept. Aware to provide a copy of the vaccination record if obtained from local pharmacy or Health Dept. Verbalized acceptance and understanding.  Flu Vaccine status: Declined, Education has been provided regarding the importance of this vaccine but patient still declined. Advised may receive this vaccine at local pharmacy or Health Dept. Aware to provide a copy of the vaccination record if obtained from local pharmacy or Health Dept. Verbalized acceptance and understanding.  Pneumococcal vaccine status: Up to date  Covid-19 vaccine status: Completed vaccines  Qualifies for Shingles Vaccine? Yes   Zostavax completed Yes   Shingrix Completed?: Yes  Screening Tests Health Maintenance  Topic Date Due   TETANUS/TDAP  09/09/2018   COVID-19 Vaccine (3 - Moderna risk series) 11/03/2020   INFLUENZA VACCINE  01/26/2022 (Originally 05/29/2021)   COLONOSCOPY (Pts 45-22yrs Insurance coverage will need to be confirmed)  08/30/2023   Pneumonia Vaccine 34+ Years old  Completed   Hepatitis C Screening  Completed   Zoster Vaccines- Shingrix  Completed   HPV VACCINES  Aged Out    Health Maintenance  Health Maintenance Due  Topic Date Due   TETANUS/TDAP  09/09/2018   COVID-19 Vaccine (3 - Moderna risk series) 11/03/2020     Colorectal cancer screening: Type of screening: Colonoscopy. Completed 08/29/13. Repeat every 10 years   Additional Screening:  Hepatitis C Screening: Completed 09/03/16  Vision Screening: Recommended annual ophthalmology exams for early detection of glaucoma and other disorders of the eye. Is the patient up to date with their annual eye exam?  Yes  Who is the provider or what is the name of the office in which the patient attends annual eye exams? Provider [address]  If pt is not established with a provider, would they like to be referred  to a provider to establish care? No .   Dental Screening: Recommended annual dental exams for proper oral hygiene  Community Resource Referral / Chronic Care Management: CRR required this visit?  No   CCM required this visit?  No      Plan:     I have personally reviewed and noted the following in the patients chart:   Medical and social history Use of alcohol, tobacco or illicit drugs  Current medications and supplements including opioid prescriptions. Patient is not currently taking opioid prescriptions. Functional ability and status Nutritional status Physical activity Advanced directives List of other physicians Hospitalizations, surgeries, and ER visits in previous 12 months Vitals Screenings to include cognitive, depression, and falls Referrals and appointments  In addition, I have reviewed and discussed with patient certain preventive protocols, quality metrics, and best practice recommendations. A written personalized care plan for preventive services as well as general preventive health recommendations were provided to patient.     [name], LPN   06/09/7516   Nurse Notes: None

## 2021-12-21 NOTE — Patient Instructions (Signed)
Trevor [name] , Thank you for taking time to come for your Medicare Wellness Visit. I appreciate your ongoing commitment to your health goals. Please review the following plan we discussed and let me know if I can assist you in the future.   Screening recommendations/referrals: Colonoscopy: Done 08/29/13 repeat every 10 years  Recommended yearly ophthalmology/optometry visit for glaucoma screening and checkup Recommended yearly dental visit for hygiene and checkup  Vaccinations: Influenza vaccine: Declined and discussed Pneumococcal vaccine: Up to date Tdap vaccine: Due and discussed  Shingles vaccine: Completed 09/21/19 & 11/24/19   Covid-19: Completed 11/11, 10/06/20  Advanced directives: Please bring a copy of your health care power of attorney and living will to the office at your convenience.  Conditions/risks identified: Stay healthy and active   Next appointment: Follow up in one year for your annual wellness visit.   Preventive Care 27 Years and Older, Male Preventive care refers to lifestyle choices and visits with your health care provider that can promote health and wellness. What does preventive care include? A yearly physical exam. This is also called an annual well check. Dental exams once or twice a year. Routine eye exams. Ask your health care provider how often you should have your eyes checked. Personal lifestyle choices, including: Daily care of your teeth and gums. Regular physical activity. Eating a healthy diet. Avoiding tobacco and drug use. Limiting alcohol use. Practicing safe sex. Taking low doses of aspirin every day. Taking vitamin and mineral supplements as recommended by your health care provider. What happens during an annual well check? The services and screenings done by your health care provider during your annual well check will depend on your age, overall health, lifestyle risk factors, and family history of disease. Counseling  Your health care  provider may ask you questions about your: Alcohol use. Tobacco use. Drug use. Emotional well-being. Home and relationship well-being. Sexual activity. Eating habits. History of falls. Memory and ability to understand (cognition). Work and work Astronomer. Screening  You may have the following tests or measurements: Height, weight, and BMI. Blood pressure. Lipid and cholesterol levels. These may be checked every 5 years, or more frequently if you are over 9 years old. Skin check. Lung cancer screening. You may have this screening every year starting at age 2 if you have a 30-pack-year history of smoking and currently smoke or have quit within the past 15 years. Fecal occult blood test (FOBT) of the stool. You may have this test every year starting at age 13. Flexible sigmoidoscopy or colonoscopy. You may have a sigmoidoscopy every 5 years or a colonoscopy every 10 years starting at age 53. Prostate cancer screening. Recommendations will vary depending on your family history and other risks. Hepatitis C blood test. Hepatitis B blood test. Sexually transmitted disease (STD) testing. Diabetes screening. This is done by checking your blood sugar (glucose) after you have not eaten for a while (fasting). You may have this done every 1-3 years. Abdominal aortic aneurysm (AAA) screening. You may need this if you are a current or former smoker. Osteoporosis. You may be screened starting at age 18 if you are at high risk. Talk with your health care provider about your test results, treatment options, and if necessary, the need for more tests. Vaccines  Your health care provider may recommend certain vaccines, such as: Influenza vaccine. This is recommended every year. Tetanus, diphtheria, and acellular pertussis (Tdap, Td) vaccine. You may need a Td booster every 10 years. Zoster vaccine. You  may need this after age 59. Pneumococcal 13-valent conjugate (PCV13) vaccine. One dose is  recommended after age 43. Pneumococcal polysaccharide (PPSV23) vaccine. One dose is recommended after age 33. Talk to your health care provider about which screenings and vaccines you need and how often you need them. This information is not intended to replace advice given to you by your health care provider. Make sure you discuss any questions you have with your health care provider. Document Released: 11/11/2015 Document Revised: 07/04/2016 Document Reviewed: 08/16/2015 Elsevier Interactive Patient Education  2017 [employer] Prevention in the Home Falls can cause injuries. They can happen to people of all ages. There are many things you can do to make your home safe and to help prevent falls. What can I do on the outside of my home? Regularly fix the edges of walkways and driveways and fix any cracks. Remove anything that might make you trip as you walk through a door, such as a raised step or threshold. Trim any bushes or trees on the path to your home. Use bright outdoor lighting. Clear any walking paths of anything that might make someone trip, such as rocks or tools. Regularly check to see if handrails are loose or broken. Make sure that both sides of any steps have handrails. Any raised decks and porches should have guardrails on the edges. Have any leaves, snow, or ice cleared regularly. Use sand or salt on walking paths during winter. Clean up any spills in your garage right away. This includes oil or grease spills. What can I do in the bathroom? Use night lights. Install grab bars by the toilet and in the tub and shower. Do not use towel bars as grab bars. Use non-skid mats or decals in the tub or shower. If you need to sit down in the shower, use a plastic, non-slip stool. Keep the floor dry. Clean up any water that spills on the floor as soon as it happens. Remove soap buildup in the tub or shower regularly. Attach bath mats securely with double-sided non-slip rug  tape. Do not have throw rugs and other things on the floor that can make you trip. What can I do in the bedroom? Use night lights. Make sure that you have a light by your bed that is easy to reach. Do not use any sheets or blankets that are too big for your bed. They should not hang down onto the floor. Have a firm chair that has side arms. You can use this for support while you get dressed. Do not have throw rugs and other things on the floor that can make you trip. What can I do in the kitchen? Clean up any spills right away. Avoid walking on wet floors. Keep items that you use a lot in easy-to-reach places. If you need to reach something above you, use a strong step stool that has a grab bar. Keep electrical cords out of the way. Do not use floor polish or wax that makes floors slippery. If you must use wax, use non-skid floor wax. Do not have throw rugs and other things on the floor that can make you trip. What can I do with my stairs? Do not leave any items on the stairs. Make sure that there are handrails on both sides of the stairs and use them. Fix handrails that are broken or loose. Make sure that handrails are as long as the stairways. Check any carpeting to make sure that it is firmly  attached to the stairs. Fix any carpet that is loose or worn. Avoid having throw rugs at the top or bottom of the stairs. If you do have throw rugs, attach them to the floor with carpet tape. Make sure that you have a light switch at the top of the stairs and the bottom of the stairs. If you do not have them, ask someone to add them for you. What else can I do to help prevent falls? Wear shoes that: Do not have high heels. Have rubber bottoms. Are comfortable and fit you well. Are closed at the toe. Do not wear sandals. If you use a stepladder: Make sure that it is fully opened. Do not climb a closed stepladder. Make sure that both sides of the stepladder are locked into place. Ask someone to  hold it for you, if possible. Clearly mark and make sure that you can see: Any grab bars or handrails. First and last steps. Where the edge of each step is. Use tools that help you move around (mobility aids) if they are needed. These include: Canes. Walkers. Scooters. Crutches. Turn on the lights when you go into a dark area. Replace any light bulbs as soon as they burn out. Set up your furniture so you have a clear path. Avoid moving your furniture around. If any of your floors are uneven, fix them. If there are any pets around you, be aware of where they are. Review your medicines with your doctor. Some medicines can make you feel dizzy. This can increase your chance of falling. Ask your doctor what other things that you can do to help prevent falls. This information is not intended to replace advice given to you by your health care provider. Make sure you discuss any questions you have with your health care provider. Document Released: 08/11/2009 Document Revised: 03/22/2016 Document Reviewed: 11/19/2014 Elsevier Interactive Patient Education  2017 [employer].

## 2022-02-13 ENCOUNTER — Other Ambulatory Visit: Payer: Self-pay | Admitting: Cardiovascular Disease

## 2022-02-15 ENCOUNTER — Other Ambulatory Visit: Payer: Self-pay | Admitting: Family Medicine

## 2022-05-15 ENCOUNTER — Ambulatory Visit: Payer: Medicare Other | Admitting: Sports Medicine

## 2022-05-15 ENCOUNTER — Ambulatory Visit
Admission: RE | Admit: 2022-05-15 | Discharge: 2022-05-15 | Disposition: A | Discharge status: Home / Self Care | Payer: Medicare Other | Source: Ambulatory Visit | Attending: Sports Medicine | Admitting: Sports Medicine

## 2022-05-15 VITALS — BP 126/67 | Ht 67.0 in | Wt 142.0 lb

## 2022-05-15 DIAGNOSIS — M25562 Pain in left knee: Secondary | ICD-10-CM

## 2022-05-15 MED ORDER — METHYLPREDNISOLONE ACETATE 40 MG/ML IJ SUSP
40.0000 mg | Freq: Once | INTRAMUSCULAR | Status: AC
  Administered 2022-05-15: 40 mg via INTRA_ARTICULAR

## 2022-05-15 NOTE — Progress Notes (Addendum)
   Subjective:    Patient ID: [name], male    DOB: 17-Jul-1946, 76 y.o.   MRN: [id number]  HPI chief complaint: Left knee pain  Trevor [name] presents today for evaluation of returning left knee pain.  He ran 2 separate 5K races, the first on July 1 and 2 on July 4.  He began to have pain at that time.  Pain initially was in the left lateral lower leg.  That has improved.  Main complaint today is left knee pain that is most noticeable with extension.  He has been unable to run since July 4.  No recent trauma.  X-rays of his knee and an MRI of his knee in 2020 showed a paucity of degenerative changes at that time.    Review of Systems As above    Objective:   Physical Exam  Well-developed, well-nourished.  No acute distress  Left knee: There is about a 5 degree extension lag.  Flexion is to 130 degrees.  No effusion.  There is pain with terminal extension.  No tenderness along medial lateral joint lines.  Negative McMurray's.  Knee is grossly stable to ligamentous exam.  Neurological exam: No atrophy.  Good strength in both lower extremities.      Assessment & Plan:   Returning left knee pain likely secondary to mild DJD  Left knee was injected with cortisone today.  This was accomplished atraumatically under sterile technique utilizing an anterior lateral approach.  He tolerates this without difficulty.  We will get some updated standing x-rays of his left knee and I will follow-up with him with those results when available.  I also discussed proceeding with an MRI of the left knee if he does not get the same benefit from today's injection that he has gotten in the past. Of note, I think the lateral lower leg pain that he was initially experiencing was the result of lumbar spondylosis.  Luckily, that pain has nearly resolved.  Consent obtained and verified. Time-out conducted. Noted no overlying erythema, induration, or other signs of local infection. Skin prepped in a sterile  fashion. Topical analgesic spray: Ethyl chloride. Joint: Left knee Needle: 25-gauge 1.5 inch Completed without difficulty. Meds: 3 cc 1% Xylocaine, 1 cc (40 mg) Depo-Medrol  Advised to call if fevers/chills, erythema, induration, drainage, or persistent bleeding  This note was dictated using Dragon naturally speaking software and may contain errors in syntax, spelling, or content which have not been identified prior to signing this note.   Addendum: X-rays reviewed.  There is mild to moderate degenerative changes at the patellofemoral joint.  Small joint effusion as well.  Trevor [name] will be notified of these results.

## 2022-07-23 ENCOUNTER — Encounter: Payer: Self-pay | Admitting: *Deleted

## 2022-08-23 ENCOUNTER — Other Ambulatory Visit: Payer: Self-pay

## 2022-08-23 MED ORDER — ROSUVASTATIN CALCIUM 5 MG PO TABS: ORAL_TABLET | ORAL | 0 refills | Status: DC

## 2022-09-19 ENCOUNTER — Other Ambulatory Visit: Payer: Self-pay | Admitting: Family Medicine

## 2022-10-01 ENCOUNTER — Telehealth: Payer: Self-pay | Admitting: Cardiovascular Disease

## 2022-10-01 ENCOUNTER — Encounter: Payer: Medicare Other | Admitting: Family Medicine

## 2022-10-01 MED ORDER — ROSUVASTATIN CALCIUM 5 MG PO TABS: ORAL_TABLET | ORAL | 2 refills | Status: DC

## 2022-10-01 NOTE — Telephone Encounter (Signed)
 *  STAT* If patient is at the pharmacy, call can be transferred to refill team.   1. Which medications need to be refilled? (please list name of each medication and dose if known)   rosuvastatin (CRESTOR) 5 MG tablet    2. Which pharmacy/location (including street and city if local pharmacy) is medication to be sent to? [id number] - [place], [place] - [address]   3. Do they need a 30 day or 90 day supply? 90 days  Pt scheduled his 2 year f/u on 03/21/23.  Need refill today, he is out of medication

## 2022-10-01 NOTE — Telephone Encounter (Signed)
Refill done ./cy 

## 2022-10-05 ENCOUNTER — Encounter: Payer: Self-pay | Admitting: Family Medicine

## 2022-10-05 ENCOUNTER — Ambulatory Visit (INDEPENDENT_AMBULATORY_CARE_PROVIDER_SITE_OTHER): Payer: Medicare Other | Admitting: Family Medicine

## 2022-10-05 VITALS — BP 106/62 | HR 61 | Temp 97.7°F | Ht 67.0 in | Wt 151.2 lb

## 2022-10-05 DIAGNOSIS — N400 Enlarged prostate without lower urinary tract symptoms: Secondary | ICD-10-CM

## 2022-10-05 DIAGNOSIS — E785 Hyperlipidemia, unspecified: Secondary | ICD-10-CM

## 2022-10-05 DIAGNOSIS — E559 Vitamin D deficiency, unspecified: Secondary | ICD-10-CM

## 2022-10-05 DIAGNOSIS — R739 Hyperglycemia, unspecified: Secondary | ICD-10-CM

## 2022-10-05 DIAGNOSIS — K219 Gastro-esophageal reflux disease without esophagitis: Secondary | ICD-10-CM | POA: Diagnosis not present

## 2022-10-05 DIAGNOSIS — Z0001 Encounter for general adult medical examination with abnormal findings: Secondary | ICD-10-CM

## 2022-10-05 LAB — CBC
HCT: 37.6 % — ABNORMAL LOW (ref 39.0–52.0)
Hemoglobin: 12.7 g/dL — ABNORMAL LOW (ref 13.0–17.0)
MCHC: 33.9 g/dL (ref 30.0–36.0)
MCV: 90.6 fl (ref 78.0–100.0)
Platelets: 250 10*3/uL (ref 150.0–400.0)
RBC: 4.15 Mil/uL — ABNORMAL LOW (ref 4.22–5.81)
RDW: 13.6 % (ref 11.5–15.5)
WBC: 6.4 10*3/uL (ref 4.0–10.5)

## 2022-10-05 LAB — COMPREHENSIVE METABOLIC PANEL
ALT: 12 U/L (ref 0–53)
AST: 22 U/L (ref 0–37)
Albumin: 4.3 g/dL (ref 3.5–5.2)
Alkaline Phosphatase: 45 U/L (ref 39–117)
BUN: 27 mg/dL — ABNORMAL HIGH (ref 6–23)
CO2: 29 mEq/L (ref 19–32)
Calcium: 9.1 mg/dL (ref 8.4–10.5)
Chloride: 104 mEq/L (ref 96–112)
Creatinine, Ser: 0.97 mg/dL (ref 0.40–1.50)
GFR: 76.8 mL/min (ref >60.00)
Glucose, Bld: 96 mg/dL (ref 70–99)
Potassium: 4.2 mEq/L (ref 3.5–5.1)
Sodium: 139 mEq/L (ref 135–145)
Total Bilirubin: 1.7 mg/dL — ABNORMAL HIGH (ref 0.2–1.2)
Total Protein: 6.5 g/dL (ref 6.0–8.3)

## 2022-10-05 LAB — LIPID PANEL
Cholesterol: 174 mg/dL (ref 0–200)
HDL: 47.9 mg/dL (ref >39.00)
LDL Cholesterol: 98 mg/dL (ref 0–99)
NonHDL: 126.51
Total CHOL/HDL Ratio: 4
Triglycerides: 144 mg/dL (ref 0.0–149.0)
VLDL: 28.8 mg/dL (ref 0.0–40.0)

## 2022-10-05 LAB — VITAMIN D 25 HYDROXY (VIT D DEFICIENCY, FRACTURES): VITD: 71.32 ng/mL (ref 30.00–100.00)

## 2022-10-05 LAB — TSH: TSH: 3.2 u[IU]/mL (ref 0.35–5.50)

## 2022-10-05 LAB — HEMOGLOBIN A1C: Hgb A1c MFr Bld: 6.1 % (ref 4.6–6.5)

## 2022-10-05 LAB — VITAMIN B12: Vitamin B-12: 301 pg/mL (ref 211–911)

## 2022-10-05 LAB — PSA: PSA: 4.65 ng/mL — ABNORMAL HIGH (ref 0.10–4.00)

## 2022-10-05 NOTE — Progress Notes (Signed)
Chief Complaint:  Trevor [name] is a 76 y.o. male who presents today for his annual comprehensive physical exam.    Assessment/Plan:  Chronic Problems Addressed Today: Hyperlipidemia with target LDL less than 70 Check lipids.  On Crestor 5 mg 3 times weekly.  BPH without obstruction/lower urinary tract symptoms Stable. Check PSA.   Gastroesophageal reflux disease without esophagitis Stable on Protonix 40 mg daily.  Hyperglycemia Check A1c.  Vitamin D deficiency Check vitamin D.  Preventative Healthcare: Check labs.  Flu vaccine declined.  Up-to-date on pneumonia and shingles vaccine.  Up-to-date on colon cancer screening.  Patient Counseling(The following topics were reviewed and/or handout was given):  -Nutrition: Stressed importance of moderation in sodium/caffeine intake, saturated fat and cholesterol, caloric balance, sufficient intake of fresh fruits, vegetables, and fiber.  -Stressed the importance of regular exercise.   -Substance Abuse: Discussed cessation/primary prevention of tobacco, alcohol, or other drug use; driving or other dangerous activities under the influence; availability of treatment for abuse.   -Injury prevention: Discussed safety belts, safety helmets, smoke detector, smoking near bedding or upholstery.   -Sexuality: Discussed sexually transmitted diseases, partner selection, use of condoms, avoidance of unintended pregnancy and contraceptive alternatives.   -Dental health: Discussed importance of regular tooth brushing, flossing, and dental visits.  -Health maintenance and immunizations reviewed. Please refer to Health maintenance section.  Return to care in 1 year for next preventative visit.     Subjective:  HPI:  He has no acute complaints today.   Lifestyle Diet: Balanced. Plenty of fruits and vegetables.  Exercise: Does a lot of running.      10/05/2022   10:49 AM  Depression screen PHQ 2/9  Decreased Interest 0  Down,  Depressed, Hopeless 0  PHQ - 2 Score 0   ROS: Per HPI, otherwise a complete review of systems was negative.   PMH:  The following were reviewed and entered/updated in epic: Past Medical History:  Diagnosis Date   Acute pericarditis    a. admitted as a code STEMI but no sig CAD on cath.    CAD (coronary artery disease)    30% stenosis mid RCA   Colonic polyp    Dr. Kinnie Scales; hyperplastic polyp   [name]    Hyperlipidemia    Patient Active Problem List   Diagnosis Date Noted   Vitamin D deficiency 10/05/2022   Hyperglycemia 09/20/2020   Gastroesophageal reflux disease without esophagitis 09/03/2016   Exercise-induced asthma 03/14/2016   CAD (coronary artery disease)    [name] 09/06/2014   History of basal cell cancer 08/31/2013   Hyperplastic colonic polyp 09/09/2008   Hyperlipidemia with target LDL less than 70 09/01/2007   BPH without obstruction/lower urinary tract symptoms 09/01/2007   Past Surgical History:  Procedure Laterality Date   actinic keratoses     facial removed x 2, Dr Danella Deis   basal cell cancer  2013 & 2015   Dr Campbell Stall   CARDIAC CATHETERIZATION N/A 10/30/2015   Procedure: Left Heart Cath and Coronary Angiography;  Surgeon: Kathleene Hazel, MD;  Location: [facility] INVASIVE CV LAB;  Service: Cardiovascular;  Laterality: N/A;   COLONOSCOPY  2014   negative   COLONOSCOPY W/ POLYPECTOMY  2004   @ age 59, Dr Kinnie Scales. ? Hyperplastic polyp   FLEXIBLE SIGMOIDOSCOPY  1999   negative   KNEE ARTHROSCOPY  1984   right    Family History  Problem Relation Age of Onset   Stroke Father 85   Alcohol  abuse Father    Asthma Brother    Breast cancer Mother    HIV Brother    Prostate cancer Brother 81   Breast cancer Maternal Aunt    Breast cancer Maternal Grandmother    Heart disease Neg Hx    Diabetes Neg Hx     Medications- reviewed and updated Current Outpatient Medications  Medication Sig Dispense Refill   Ascorbic Acid (VITA-C PO)  Take 1 tablet by mouth daily.     aspirin 81 MG tablet Take 81 mg by mouth daily.       Cholecalciferol (VITAMIN D) 2000 UNITS CAPS Take 2,000 Units by mouth daily.      fish oil-omega-3 fatty acids 1000 MG capsule Take 1 g by mouth daily.      Magnesium 250 MG TABS Take 250 mg by mouth daily.      metroNIDAZOLE (METROCREAM) 0.75 % cream Apply 1 application topically as needed.     Multiple Vitamins-Minerals (MENS 50+ MULTI VITAMIN/MIN PO) Take 1 tablet daily by mouth.     NON FORMULARY      pantoprazole (PROTONIX) 40 MG tablet TAKE 1 TABLET(40 MG) BY MOUTH DAILY 90 tablet 1   rosuvastatin (CRESTOR) 5 MG tablet TAKE 1 TABLET BY MOUTH 3 TIMES A WEEK 36 tablet 2   Turmeric (QC TUMERIC COMPLEX PO) Take by mouth.     Zinc 25 MG TABS Take 1 tablet by mouth daily.     No current facility-administered medications for this visit.    Allergies-reviewed and updated Allergies  Allergen Reactions   Codeine Anaphylaxis    Mental status changes   Niacin Other (See Comments)    flushing    Social History   Socioeconomic History   Marital status: Married    Spouse name: Not on file   Number of children: Not on file   Years of education: Not on file   Highest education level: Not on file  Occupational History   Occupation: retired   Tobacco Use   Smoking status: Former    Packs/day: 1.00    Years: 13.00    Total pack years: 13.00    Types: Cigarettes    Quit date: 10/29/1978    Years since quitting: 43.9   Smokeless tobacco: Never   Tobacco comments:    smoked 1967-1980, up to 1 ppd  Vaping Use   Vaping Use: Never used  Substance and Sexual Activity   Alcohol use: No    Alcohol/week: 0.0 standard drinks of alcohol   Drug use: No   Sexual activity: Not on file  Other Topics Concern   Not on file  Social History Narrative   Originally from [place]. Moved to [place] in 1951. He moved to [place] in 1984. 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  Previously has traveled to [place], [place], [place], [place], [place], and [place]. Normally has a cockatiel but not currently No mold exposure. Has a hot tub but doesn't use it regularly. No children    Social Determinants of Health   Financial Resource Strain: Low Risk  (12/21/2021)   Overall Financial Resource Strain (CARDIA)    Difficulty of Paying Living Expenses: Not hard at all  Food Insecurity: No Food Insecurity (12/21/2021)   Hunger Vital Sign    Worried About Running Out of Food in the Last Year: Never true    Ran Out of Food in the Last Year: Never true  Transportation Needs: No Transportation  Needs (12/21/2021)   PRAPARE - Administrator, Civil Service (Medical): No    Lack of Transportation (Non-Medical): No  Physical Activity: Sufficiently Active (12/21/2021)   Exercise Vital Sign    Days of Exercise per Week: 5 days    Minutes of Exercise per Session: 90 min  Stress: No Stress Concern Present (12/21/2021)   [employer] of Occupational Health - Occupational Stress Questionnaire    Feeling of Stress : Not at all  Social Connections: Socially Integrated (12/21/2021)   Social Connection and Isolation Panel [NHANES]    Frequency of Communication with Friends and Family: Three times a week    Frequency of Social Gatherings with Friends and Family: More than three times a week    Attends Religious Services: More than 4 times per year    Active Member of Clubs or Organizations: Yes    Attends Banker Meetings: 1 to 4 times per year    Marital Status: Married        Objective:  Physical Exam: BP 106/62   Pulse 61   Temp 97.7 F (36.5 C) (Temporal)   Ht 5\' 7"  (1.702 m)   Wt 151 lb 3.2 oz (68.6 kg)   SpO2 96%   BMI 23.68 kg/m   Body mass index is 23.68 kg/m. Wt Readings from Last 3 Encounters:  10/05/22 151 lb 3.2 oz (68.6 kg)  05/15/22 142 lb (64.4 kg)  11/21/21 150 lb (68 kg)   Gen: NAD, resting comfortably HEENT: TMs  normal bilaterally. OP clear. No thyromegaly noted.  CV: RRR with no murmurs appreciated Pulm: NWOB, CTAB with no crackles, wheezes, or rhonchi GI: Normal bowel sounds present. Soft, Nontender, Nondistended. MSK: no edema, cyanosis, or clubbing noted Skin: warm, dry Neuro: CN2-12 grossly intact. Strength 5/5 in upper and lower extremities. Reflexes symmetric and intact bilaterally.  Psych: Normal affect and thought content     Aracelli Woloszyn M. 11/23/21, MD 10/05/2022 11:34 AM

## 2022-10-05 NOTE — Assessment & Plan Note (Signed)
Check A1c. 

## 2022-10-05 NOTE — Assessment & Plan Note (Signed)
Stable.  Check PSA. 

## 2022-10-05 NOTE — Assessment & Plan Note (Signed)
Stable on Protonix 40 mg daily. 

## 2022-10-05 NOTE — Patient Instructions (Signed)
It was very nice to see you today!  Will check blood work today.  Please keep up the great work with your diet and exercise.  Will see back in year for your next physical.  Come back sooner if needed.  Take care, Dr [name]  PLEASE NOTE:  If you had any lab tests please let us know if you have not heard back within a few days. You may see your results on mychart before we have a chance to review them but we will give you a call once they are reviewed by [place]. If we ordered any referrals today, please let us know if you have not heard from their office within the next week.   Please try these tips to maintain a healthy lifestyle:  Eat at least 3 REAL meals and 1-2 snacks per day.  Aim for no more than 5 hours between eating.  If you eat breakfast, please do so within one hour of getting up.   Each meal should contain half fruits/vegetables, one quarter protein, and one quarter carbs (no bigger than a computer mouse)  Cut down on sweet beverages. This includes juice, soda, and sweet tea.   Drink at least 1 glass of water with each meal and aim for at least 8 glasses per day  Exercise at least 150 minutes every week.    Preventive Care 36 Years and Older, Male Preventive care refers to lifestyle choices and visits with your health care provider that can promote health and wellness. Preventive care visits are also called wellness exams. What can I expect for my preventive care visit? Counseling During your preventive care visit, your health care provider may ask about your: Medical history, including: Past medical problems. Family medical history. History of falls. Current health, including: Emotional well-being. Home life and relationship well-being. Sexual activity. Memory and ability to understand (cognition). Lifestyle, including: Alcohol, nicotine or tobacco, and drug use. Access to firearms. Diet, exercise, and sleep habits. Work and work Astronomer. Sunscreen  use. Safety issues such as seatbelt and bike helmet use. Physical exam Your health care provider will check your: Height and weight. These may be used to calculate your BMI (body mass index). BMI is a measurement that tells if you are at a healthy weight. Waist circumference. This measures the distance around your waistline. This measurement also tells if you are at a healthy weight and may help predict your risk of certain diseases, such as type 2 diabetes and high blood pressure. Heart rate and blood pressure. Body temperature. Skin for abnormal spots. What immunizations do I need?  Vaccines are usually given at various ages, according to a schedule. Your health care provider will recommend vaccines for you based on your age, medical history, and lifestyle or other factors, such as travel or where you work. What tests do I need? Screening Your health care provider may recommend screening tests for certain conditions. This may include: Lipid and cholesterol levels. Diabetes screening. This is done by checking your blood sugar (glucose) after you have not eaten for a while (fasting). Hepatitis C test. Hepatitis B test. HIV (human immunodeficiency virus) test. STI (sexually transmitted infection) testing, if you are at risk. Lung cancer screening. Colorectal cancer screening. Prostate cancer screening. Abdominal aortic aneurysm (AAA) screening. You may need this if you are a current or former smoker. Talk with your health care provider about your test results, treatment options, and if necessary, the need for more tests. Follow these instructions at home: Eating  and drinking  Eat a diet that includes fresh fruits and vegetables, whole grains, lean protein, and low-fat dairy products. Limit your intake of foods with high amounts of sugar, saturated fats, and salt. Take vitamin and mineral supplements as recommended by your health care provider. Do not drink alcohol if your health care  provider tells you not to drink. If you drink alcohol: Limit how much you have to 0-2 drinks a day. Know how much alcohol is in your drink. In the [place] one drink equals one 12 oz bottle of beer (355 mL), one 5 oz glass of wine (148 mL), or one 1 oz glass of hard liquor (44 mL). Lifestyle Brush your teeth every morning and night with fluoride toothpaste. Floss one time each day. Exercise for at least 30 minutes 5 or more days each week. Do not use any products that contain nicotine or tobacco. These products include cigarettes, chewing tobacco, and vaping devices, such as e-cigarettes. If you need help quitting, ask your health care provider. Do not use drugs. If you are sexually active, practice safe sex. Use a condom or other form of protection to prevent STIs. Take aspirin only as told by your health care provider. Make sure that you understand how much to take and what form to take. Work with your health care provider to find out whether it is safe and beneficial for you to take aspirin daily. Ask your health care provider if you need to take a cholesterol-lowering medicine (statin). Find healthy ways to manage stress, such as: Meditation, yoga, or listening to music. Journaling. Talking to a trusted person. Spending time with friends and family. Safety Always wear your seat belt while driving or riding in a vehicle. Do not drive: If you have been drinking alcohol. Do not ride with someone who has been drinking. When you are tired or distracted. While texting. If you have been using any mind-altering substances or drugs. Wear a helmet and other protective equipment during sports activities. If you have firearms in your house, make sure you follow all gun safety procedures. Minimize exposure to UV radiation to reduce your risk of skin cancer. What's next? Visit your health care provider once a year for an annual wellness visit. Ask your health care provider how often you should have  your eyes and teeth checked. Stay up to date on all vaccines. This information is not intended to replace advice given to you by your health care provider. Make sure you discuss any questions you have with your health care provider. Document Revised: 04/12/2021 Document Reviewed: 04/12/2021 Elsevier Patient Education  2023 [employer].

## 2022-10-05 NOTE — Assessment & Plan Note (Signed)
Check vitamin D. 

## 2022-10-05 NOTE — Assessment & Plan Note (Signed)
Check lipids.  On Crestor 5 mg 3 times weekly.

## 2022-10-09 NOTE — Progress Notes (Signed)
Please inform patient of the following:  His PSA is up quite a bit since last time.  This is typically due to a benign process however recommend we refer him to urology for further evaluation.  Blood sugars in the prediabetic range.  Do not need to start meds but he should continue to work on diet and exercise.  Cholesterol levels are at goal.  Blood counts are slightly improved compared to last year.  Everything else is stable.  Do not need to make any changes to his treatment plan at this time.  He should continue to work on diet and exercise and we can recheck everything in a year.

## 2022-10-15 ENCOUNTER — Other Ambulatory Visit: Payer: Self-pay | Admitting: *Deleted

## 2022-10-15 DIAGNOSIS — R972 Elevated prostate specific antigen [PSA]: Secondary | ICD-10-CM

## 2022-10-18 ENCOUNTER — Telehealth: Payer: Self-pay | Admitting: Family Medicine

## 2022-10-18 NOTE — Telephone Encounter (Signed)
Spoke with patient, patient question about Urology referral  Patient notified referral was send their office will call him with appt information

## 2022-10-18 NOTE — Telephone Encounter (Signed)
Patient states: - He was calling for PSA results  - He isn't clear on what is going on   I informed patient that Francena Hanly spoke with him on Tuesday but he did not recall.

## 2022-11-21 ENCOUNTER — Other Ambulatory Visit: Payer: Self-pay | Admitting: Family Medicine

## 2022-12-18 ENCOUNTER — Telehealth: Payer: Self-pay | Admitting: Family Medicine

## 2022-12-18 NOTE — Telephone Encounter (Signed)
Contacted Charlestine Night to schedule their annual wellness visit. Appointment made for 12/25/2022.  [facility] Dial [phone]

## 2022-12-25 ENCOUNTER — Ambulatory Visit (INDEPENDENT_AMBULATORY_CARE_PROVIDER_SITE_OTHER): Payer: Medicare Other

## 2022-12-25 ENCOUNTER — Ambulatory Visit: Payer: Medicare Other | Admitting: Sports Medicine

## 2022-12-25 VITALS — BP 134/70 | Ht 67.0 in | Wt 120.0 lb

## 2022-12-25 VITALS — Wt 151.0 lb

## 2022-12-25 DIAGNOSIS — M25561 Pain in right knee: Secondary | ICD-10-CM | POA: Diagnosis not present

## 2022-12-25 DIAGNOSIS — Z0000 Encounter for general adult medical examination without abnormal findings: Secondary | ICD-10-CM | POA: Diagnosis not present

## 2022-12-25 DIAGNOSIS — M25562 Pain in left knee: Secondary | ICD-10-CM | POA: Diagnosis not present

## 2022-12-25 NOTE — Progress Notes (Signed)
   Subjective:    Patient ID: Trevor [name], male    DOB: [birth year], 77 y.o.   MRN: [id number]  HPI chief complaint: Bilateral knee pain  Trevor [name] presents today complaining of bilateral knee pain, left greater than right.  He continues to be an avid runner.  He is running about 20 to 25 miles per week.  He enjoys running both 5K's and 10K's.  He also endorses some occasional clicking in the right knee but it is not painful.  He has not noticed any swelling in either knee.  He takes 200 mg of ibuprofen when needed and this is helpful.  He also has a compression sleeve but has not been wearing it recently.    Review of Systems As above    Objective:   Physical Exam  Well-developed, fit appearing.  No acute distress  Right knee: Full range of motion.  2+ patellofemoral crepitus.  No effusion.  Knee is stable ligamentous exam.  Negative McMurray's.  Left knee: There is a 5 to 10 degree extension lag.  Full flexion.  No effusion.  Knee is stable to ligamentous exam.  2+ patellofemoral crepitus.  Negative McMurray's.      Assessment & Plan:   Bilateral knee pain, left greater than right, secondary to mild DJD  Imaging of the left knee done in 2020 showed no significant degenerative changes at that time.  However, I am sure that Trevor [name] has a mild amount of arthritis in both knees.  I think he would benefit from formal physical therapy so we will refer him to Barbara[facility] Barthel.  He may also continue with his 200 mg of ibuprofen as needed for pain.  I did recommend that he resume wearing his knee sleeve when running.  I also suggested that he try some topical Voltaren gel.  If his pain persist, particularly in the left knee, he will return to the office for cortisone injection.  Follow-up for ongoing or recalcitrant issues.  This note was dictated using Dragon naturally speaking software and may contain errors in syntax, spelling, or content which have not been identified prior to signing this  note.

## 2022-12-25 NOTE — Progress Notes (Signed)
I connected with  Trevor [name] on 12/25/22 by a audio enabled telemedicine application and verified that I am speaking with the correct person using two identifiers.  Patient Location: Home  Provider Location: Office/Clinic  I discussed the limitations of evaluation and management by telemedicine. The patient expressed understanding and agreed to proceed.   Subjective:   Trevor [name] is a 77 y.o. male who presents for Medicare Annual/Subsequent preventive examination.  Review of Systems     Cardiac Risk Factors include: advanced age (>55mn, >>74women);male gender;dyslipidemia     Objective:    Today's Vitals   12/25/22 1004  Weight: 151 lb (68.5 kg)   Body mass index is 23.65 kg/m.     12/25/2022   10:08 AM 12/21/2021   11:01 AM 12/12/2020    1:16 PM 09/28/2019    4:16 PM 09/22/2018   11:27 AM 09/11/2017    8:51 AM 07/03/2017    3:20 PM  Advanced Directives  Does Patient Have a Medical Advance Directive? Yes Yes Yes Yes Yes Yes No  Type of AParamedicof ASelmanLiving will Healthcare Power of [place]    Does patient want to make changes to medical advance directive?    No - Patient declined     Copy of HMcLeodin Chart? No - copy requested No - copy requested No - copy requested No - copy requested       Current Medications (verified) Outpatient Encounter Medications as of 12/25/2022  Medication Sig   Ascorbic Acid (VITA-C PO) Take 1 tablet by mouth daily.   aspirin 81 MG tablet Take 81 mg by mouth daily.     Cholecalciferol (VITAMIN D) 2000 UNITS CAPS Take 2,000 Units by mouth daily.    fish oil-omega-3 fatty acids 1000 MG capsule Take 1 g by mouth daily.    Magnesium 250 MG TABS Take 250 mg by mouth daily.    metroNIDAZOLE (METROCREAM) 0.75 % cream Apply 1 application topically as needed.   Multiple Vitamins-Minerals (MENS 50+ MULTI  VITAMIN/MIN PO) Take 1 tablet daily by mouth.   pantoprazole (PROTONIX) 40 MG tablet TAKE 1 TABLET(40 MG) BY MOUTH DAILY   rosuvastatin (CRESTOR) 5 MG tablet TAKE 1 TABLET BY MOUTH 3 TIMES A WEEK   Turmeric (QC TUMERIC COMPLEX PO) Take by mouth.   Zinc 25 MG TABS Take 1 tablet by mouth daily.   NON FORMULARY    No facility-administered encounter medications on file as of 12/25/2022.    Allergies (verified) Codeine and Niacin   History: Past Medical History:  Diagnosis Date   Acute pericarditis    a. admitted as a code STEMI but no sig CAD on cath.    CAD (coronary artery disease)    30% stenosis mid RCA   Colonic polyp    Dr. MEarlean Shawl hyperplastic polyp   GRosanna Randysyndrome    Hyperlipidemia    Past Surgical History:  Procedure Laterality Date   actinic keratoses     facial removed x 2, Dr GTonia Brooms  basal cell cancer  2013 & 2015   Dr HCrista Luria  CARDIAC CATHETERIZATION N/A 10/30/2015   Procedure: Left Heart Cath and Coronary Angiography;  Surgeon: CBurnell Blanks MD;  [facility];  Service: Cardiovascular;  Laterality: N/A;   COLONOSCOPY  2014   negative   COLONOSCOPY W/ POLYPECTOMY  2004   @ age 65 Dr  Medoff. ? Hyperplastic polyp   FLEXIBLE SIGMOIDOSCOPY  1999   negative   KNEE ARTHROSCOPY  1984   right   Family History  Problem Relation Age of Onset   Stroke Father 85   Alcohol abuse Father    Asthma Brother    Breast cancer Mother    HIV Brother    Prostate cancer Brother 60   Breast cancer Maternal Aunt    Breast cancer Maternal Grandmother    Heart disease Neg Hx    Diabetes Neg Hx    Social History   Socioeconomic History   Marital status: Married    Spouse name: Not on file   Number of children: Not on file   Years of education: Not on file   Highest education level: Not on file  Occupational History   Occupation: retired   Tobacco Use   Smoking status: Former    Packs/day: 1.00    Years: 13.00    Total pack years:  13.00    Types: Cigarettes    Quit date: 10/29/1978    Years since quitting: 44.1   Smokeless tobacco: Never   Tobacco comments:    smoked 1967-1980, up to 1 ppd  Vaping Use   Vaping Use: Never used  Substance and Sexual Activity   Alcohol use: No    Alcohol/week: 0.0 standard drinks of alcohol   Drug use: No   Sexual activity: Not on file  Other Topics Concern   Not on file  Social History Narrative   Originally from [place]. Moved to [place] in 1951. He moved to [place] in 1984. Has worked doing Geneticist, molecular. Has also worked for [employer]. Previously has traveled to [place], [place], [place], [place], [place], [place], and [place]. Normally has a cockatiel but not currently No mold exposure. Has a hot tub but doesn't use it regularly. No children    Social Determinants of Health   Financial Resource Strain: Low Risk  (12/25/2022)   Overall Financial Resource Strain (CARDIA)    Difficulty of Paying Living Expenses: Not hard at all  Food Insecurity: No Food Insecurity (12/25/2022)   Hunger Vital Sign    Worried About Running Out of Food in the Last Year: Never true    Ran Out of Food in the Last Year: Never true  Transportation Needs: No Transportation Needs (12/25/2022)   PRAPARE - Hydrologist (Medical): No    Lack of Transportation (Non-Medical): No  Physical Activity: Sufficiently Active (12/25/2022)   Exercise Vital Sign    Days of Exercise per Week: 5 days    Minutes of Exercise per Session: 90 min  Stress: No Stress Concern Present (12/25/2022)   [place]    Feeling of Stress : Not at all  Social Connections: [place] (12/25/2022)   Social Connection and Isolation Panel [NHANES]    Frequency of Communication with Friends and Family: More than three times a week    Frequency of Social Gatherings with Friends and Family: More than three times a week     Attends Religious Services: More than 4 times per year    Active Member of [employer] or Organizations: Yes    Attends Archivist Meetings: 1 to 4 times per year    Marital Status: Married    Tobacco Counseling Counseling given: Not Answered Tobacco comments: smoked 1967-1980, up to 1 ppd   Clinical Intake:  Pre-visit preparation completed:  Yes  Pain : No/denies pain     BMI - recorded: 23.65 Nutritional Status: BMI of 19-24  Normal Nutritional Risks: None Diabetes: No  How often do you need to have someone help you when you read instructions, pamphlets, or other written materials from your doctor or pharmacy?: 1 - Never  Diabetic?no  Interpreter Needed?: No  Information entered by :: Charlott Rakes, LPN   Activities of Daily Living    12/25/2022   10:09 AM  In your present state of health, do you have any difficulty performing the following activities:  Hearing? 0  Vision? 0  Difficulty concentrating or making decisions? 0  Walking or climbing stairs? 0  Dressing or bathing? 0  Doing errands, shopping? 0  Preparing Food and eating ? N  Using the Toilet? N  In the past six months, have you accidently leaked urine? N  Do you have problems with loss of bowel control? N  Managing your Medications? N  Managing your Finances? N  Housekeeping or managing your Housekeeping? N    Patient Care Team: Vivi Barrack, MD as PCP - General (Family Medicine) Burnell Blanks, MD as PCP - Cardiology (Cardiology) Phylliss Bob, MD as Consulting Physician (Orthopedic Surgery)  Indicate any recent Medical Services you may have received from other than [facility] providers in the past year (date may be approximate).     Assessment:   This is a routine wellness examination for Trevor [name].  Hearing/Vision screen Hearing Screening - Comments:: Pt denies any hearing issues  Vision Screening - Comments:: Pt follows up with Dr Valetta Close for annual eye exams   Dietary  issues and exercise activities discussed: Current Exercise Habits: Home exercise routine, Type of exercise: Other - see comments (running), Time (Minutes): > 60, Frequency (Times/Week): 5, Weekly Exercise (Minutes/Week): 0   Goals Addressed             This Visit's Progress    Patient Stated       Stay healthy and active        Depression Screen    12/25/2022   10:07 AM 10/05/2022   10:49 AM 12/21/2021   11:00 AM 12/12/2020    1:14 PM 09/20/2020    9:20 AM 09/14/2019    9:18 AM 09/22/2018   11:30 AM  PHQ 2/9 Scores  PHQ - 2 Score 0 0 0 0 0 0 0  PHQ- 9 Score      0     Fall Risk    12/25/2022   10:08 AM 10/05/2022   10:49 AM 12/21/2021   11:04 AM 12/12/2020    1:17 PM 09/28/2019    4:17 PM  Fall Risk   Falls in the past year? 0 0 0 0 0  Number falls in past yr: 0 0 0 0   Injury with Fall? 0 0 0 0 0  Risk for fall due to : Impaired vision No Fall Risks Impaired vision Impaired vision   Follow up Falls prevention discussed  Falls prevention discussed Falls prevention discussed Falls evaluation completed;Falls prevention discussed;Education provided    FALL RISK PREVENTION PERTAINING TO THE HOME:  Any stairs in or around the home? Yes  If so, are there any without handrails? No  Home free of loose throw rugs in walkways, pet beds, electrical cords, etc? Yes  Adequate lighting in your home to reduce risk of falls? Yes   ASSISTIVE DEVICES UTILIZED TO PREVENT FALLS:  Life alert? No  Use of a cane,  walker or w/c? No  Grab bars in the bathroom? No  Shower chair or bench in shower? No  Elevated toilet seat or a handicapped toilet? No   TIMED UP AND GO:  Was the test performed? No .  Cognitive Function:DECLINED see note below         Immunizations Immunization History  Administered Date(s) Administered   Moderna Sars-Covid-2 Vaccination 09/08/2020, 10/06/2020   Pneumococcal Conjugate-13 10/21/2015   Pneumococcal Polysaccharide-23 06/13/2017   Td 09/09/2008    Zoster Recombinat (Shingrix) 09/21/2019, 11/24/2019   Zoster, Live 09/17/2014    TDAP status: Due, Education has been provided regarding the importance of this vaccine. Advised may receive this vaccine at local pharmacy or Health Dept. Aware to provide a copy of the vaccination record if obtained from local pharmacy or Health Dept. Verbalized acceptance and understanding.  Flu Vaccine status: Declined, Education has been provided regarding the importance of this vaccine but patient still declined. Advised may receive this vaccine at local pharmacy or Health Dept. Aware to provide a copy of the vaccination record if obtained from local pharmacy or Health Dept. Verbalized acceptance and understanding.  Pneumococcal vaccine status: Up to date  Covid-19 vaccine status: Completed vaccines  Qualifies for Shingles Vaccine? Yes   Zostavax completed Yes   Shingrix Completed?: Yes  Screening Tests Health Maintenance  Topic Date Due   COVID-19 Vaccine (3 - Moderna risk series) 11/03/2020   INFLUENZA VACCINE  01/28/2023 (Originally 05/29/2022)   COLONOSCOPY (Pts 45-33yr Insurance coverage will need to be confirmed)  08/30/2023   Medicare Annual Wellness (AWV)  12/26/2023   Pneumonia Vaccine 75 Years old  Completed   Hepatitis C Screening  Completed   Zoster Vaccines- Shingrix  Completed   HPV VACCINES  Aged Out   DTaP/Tdap/Td  Discontinued    Health Maintenance  Health Maintenance Due  Topic Date Due   COVID-19 Vaccine (3 - Moderna risk series) 11/03/2020    Colorectal cancer screening: Type of screening: Colonoscopy. Completed 08/30/23. Repeat every 10 years   Additional Screening:  Hepatitis C Screening:  Completed 09/03/16  Vision Screening: Recommended annual ophthalmology exams for early detection of glaucoma and other disorders of the eye. Is the patient up to date with their annual eye exam?  Yes  Who is the provider or what is the name of the office in which the patient  attends annual eye exams? Dr BValetta Close If pt is not established with a provider, would they like to be referred to a provider to establish care? No .   Dental Screening: Recommended annual dental exams for proper oral hygiene  Community Resource Referral / Chronic Care Management: CRR required this visit?  No   CCM required this visit?  No      Plan:     I have personally reviewed and noted the following in the patient's chart:   Medical and social history Use of alcohol, tobacco or illicit drugs  Current medications and supplements including opioid prescriptions. Patient is not currently taking opioid prescriptions. Functional ability and status Nutritional status Physical activity Advanced directives List of other physicians Hospitalizations, surgeries, and ER visits in previous 12 months Vitals Screenings to include cognitive, depression, and falls Referrals and appointments  In addition, I have reviewed and discussed with patient certain preventive protocols, quality metrics, and best practice recommendations. A written personalized care plan for preventive services as well as general preventive health recommendations were provided to patient.     TWillette Brace LPN  12/25/2022   Nurse Notes: Pt declined cognitive testing at this time, pt was aware and knowledgeable to questions asked

## 2022-12-25 NOTE — Patient Instructions (Signed)
[name] , Thank you for taking time to come for your Medicare Wellness Visit. I appreciate your ongoing commitment to your health goals. Please review the following plan we discussed and let me know if I can assist you in the future.   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  Completed   Zoster (Shingles) Vaccine  Completed   HPV Vaccine  Aged Out   DTaP/Tdap/Td vaccine  Discontinued  *Topic was postponed. The date shown is not the original due date.    Advanced directives: Please bring a copy of your health care power of attorney and living will to the office at your convenience.  Conditions/risks identified: stay healthy and active   Next appointment: Follow up in one year for your annual wellness visit.   Preventive Care 101 Years and Older, Male  Preventive care refers to lifestyle choices and visits with your health care provider that can promote health and wellness. What does preventive care include? A yearly physical exam. This is also called an annual well check. Dental exams once or twice a year. Routine eye exams. Ask your health care provider how often you should have your eyes checked. Personal lifestyle choices, including: Daily care of your teeth and  gums. Regular physical activity. Eating a healthy diet. Avoiding tobacco and drug use. Limiting alcohol use. Practicing safe sex. Taking low doses of aspirin every day. Taking vitamin and mineral supplements as recommended by your health care provider. What happens during an annual well check? The services and screenings done by your health care provider during your annual well check will depend on your age, overall health, lifestyle risk factors, and family history of disease. Counseling  Your health care provider may ask you questions about your: Alcohol use. Tobacco use. Drug use. Emotional well-being. Home and relationship well-being. Sexual activity. Eating habits. History of falls. Memory and ability to understand (cognition). Work and work Statistician. Screening  You may have the following tests or measurements: Height, weight, and BMI. Blood pressure. Lipid and cholesterol levels. These may be checked every 5 years, or more frequently if you are over 46 years old. Skin check. Lung cancer screening. You may have this screening every year starting at age 43 if you have a 30-pack-year history of smoking and currently smoke or have quit within the past 15 years. Fecal occult blood test (FOBT) of the stool. You may have this test every year starting at age 92. Flexible sigmoidoscopy or colonoscopy. You may have a sigmoidoscopy every 5 years  or a colonoscopy every 10 years starting at age 56. Prostate cancer screening. Recommendations will vary depending on your family history and other risks. Hepatitis C blood test. Hepatitis B blood test. Sexually transmitted disease (STD) testing. Diabetes screening. This is done by checking your blood sugar (glucose) after you have not eaten for a while (fasting). You may have this done every 1-3 years. Abdominal aortic aneurysm (AAA) screening. You may need this if you are a current or former smoker. Osteoporosis. You may be screened  starting at age 75 if you are at high risk. Talk with your health care provider about your test results, treatment options, and if necessary, the need for more tests. Vaccines  Your health care provider may recommend certain vaccines, such as: Influenza vaccine. This is recommended every year. Tetanus, diphtheria, and acellular pertussis (Tdap, Td) vaccine. You may need a Td booster every 10 years. Zoster vaccine. You may need this after age 59. Pneumococcal 13-valent conjugate (PCV13) vaccine. One dose is recommended after age 30. Pneumococcal polysaccharide (PPSV23) vaccine. One dose is recommended after age 29. Talk to your health care provider about which screenings and vaccines you need and how often you need them. This information is not intended to replace advice given to you by your health care provider. Make sure you discuss any questions you have with your health care provider. Document Released: 11/11/2015 Document Revised: 07/04/2016 Document Reviewed: 08/16/2015 Elsevier Interactive Patient Education  2017 [employer] Prevention in the Home Falls can cause injuries. They can happen to people of all ages. There are many things you can do to make your home safe and to help prevent falls. What can I do on the outside of my home? Regularly fix the edges of walkways and driveways and fix any cracks. Remove anything that might make you trip as you walk through a door, such as a raised step or threshold. Trim any bushes or trees on the path to your home. Use bright outdoor lighting. Clear any walking paths of anything that might make someone trip, such as rocks or tools. Regularly check to see if handrails are loose or broken. Make sure that both sides of any steps have handrails. Any raised decks and porches should have guardrails on the edges. Have any leaves, snow, or ice cleared regularly. Use sand or salt on walking paths during winter. Clean up any spills in your garage  right away. This includes oil or grease spills. What can I do in the bathroom? Use night lights. Install grab bars by the toilet and in the tub and shower. Do not use towel bars as grab bars. Use non-skid mats or decals in the tub or shower. If you need to sit down in the shower, use a plastic, non-slip stool. Keep the floor dry. Clean up any water that spills on the floor as soon as it happens. Remove soap buildup in the tub or shower regularly. Attach bath mats securely with double-sided non-slip rug tape. Do not have throw rugs and other things on the floor that can make you trip. What can I do in the bedroom? Use night lights. Make sure that you have a light by your bed that is easy to reach. Do not use any sheets or blankets that are too big for your bed. They should not hang down onto the floor. Have a firm chair that has side arms. You can use this for support while you get dressed. Do not have throw rugs and  other things on the floor that can make you trip. What can I do in the kitchen? Clean up any spills right away. Avoid walking on wet floors. Keep items that you use a lot in easy-to-reach places. If you need to reach something above you, use a strong step stool that has a grab bar. Keep electrical cords out of the way. Do not use floor polish or wax that makes floors slippery. If you must use wax, use non-skid floor wax. Do not have throw rugs and other things on the floor that can make you trip. What can I do with my stairs? Do not leave any items on the stairs. Make sure that there are handrails on both sides of the stairs and use them. Fix handrails that are broken or loose. Make sure that handrails are as long as the stairways. Check any carpeting to make sure that it is firmly attached to the stairs. Fix any carpet that is loose or worn. Avoid having throw rugs at the top or bottom of the stairs. If you do have throw rugs, attach them to the floor with carpet tape. Make  sure that you have a light switch at the top of the stairs and the bottom of the stairs. If you do not have them, ask someone to add them for you. What else can I do to help prevent falls? Wear shoes that: Do not have high heels. Have rubber bottoms. Are comfortable and fit you well. Are closed at the toe. Do not wear sandals. If you use a stepladder: Make sure that it is fully opened. Do not climb a closed stepladder. Make sure that both sides of the stepladder are locked into place. Ask someone to hold it for you, if possible. Clearly mark and make sure that you can see: Any grab bars or handrails. First and last steps. Where the edge of each step is. Use tools that help you move around (mobility aids) if they are needed. These include: Canes. Walkers. Scooters. Crutches. Turn on the lights when you go into a dark area. Replace any light bulbs as soon as they burn out. Set up your furniture so you have a clear path. Avoid moving your furniture around. If any of your floors are uneven, fix them. If there are any pets around you, be aware of where they are. Review your medicines with your doctor. Some medicines can make you feel dizzy. This can increase your chance of falling. Ask your doctor what other things that you can do to help prevent falls. This information is not intended to replace advice given to you by your health care provider. Make sure you discuss any questions you have with your health care provider. Document Released: 08/11/2009 Document Revised: 03/22/2016 Document Reviewed: 11/19/2014 Elsevier Interactive Patient Education  2017 [employer].

## 2023-03-21 ENCOUNTER — Ambulatory Visit: Payer: Medicare Other | Admitting: Cardiovascular Disease

## 2023-03-27 ENCOUNTER — Other Ambulatory Visit: Payer: Self-pay

## 2023-03-27 ENCOUNTER — Ambulatory Visit: Payer: Medicare Other | Admitting: Sports Medicine

## 2023-03-27 VITALS — BP 126/60 | Ht 67.0 in | Wt 120.0 lb

## 2023-03-27 DIAGNOSIS — M25511 Pain in right shoulder: Secondary | ICD-10-CM | POA: Insufficient documentation

## 2023-03-27 MED ORDER — METHYLPREDNISOLONE ACETATE 40 MG/ML IJ SUSP
40.0000 mg | Freq: Once | INTRAMUSCULAR | Status: AC
  Administered 2023-03-27: 40 mg via INTRA_ARTICULAR

## 2023-03-27 NOTE — Progress Notes (Signed)
Established Patient Office Visit  Subjective   Patient ID: Trevor [name], male    DOB: 01-13-1946  Age: 77 y.o. MRN: [id number]  Right shoulder pain.  Trevor [name] is here today with chief complaint of right shoulder pain.  He reports about 5 weeks ago he bumped his shoulder into a wall and has had some discomfort ever since.  He describes his pain as an ache that is located mostly in the posterior aspect of his shoulder.  It is worse with overhead activities and reaching up.  It sometimes bothers him while he is running.  He has tried ibuprofen which seems that take the edge off.  He is also been working with Trevor [name] with some physical therapy which she reports is not giving him any prolonged relief.   ROS as listed above in HPI    Objective:     BP 126/60   Ht 5\' 7"  (1.702 m)   Wt 120 lb (54.4 kg)   BMI 18.79 kg/m   Physical Exam Vitals reviewed.  Constitutional:      General: He is not in acute distress.    Appearance: Normal appearance. He is normal weight. He is not ill-appearing, toxic-appearing or diaphoretic.  Pulmonary:     Effort: Pulmonary effort is normal.  Skin:    General: Skin is warm.  Neurological:     Mental Status: He is alert.   Right shoulder: No obvious deformity or asymmetry.  No ecchymosis.  He has full range of motion with forward flexion, abduction, internal and external rotation.  No tenderness to palpation of the [facility] joint or lateral shoulder.  Strength 5/5 resisted forward flexion, internal and external rotation.  Pain and some weakness with liftoff testing.  Some discomfort with cross body.  Did have Hawkins testing for impingement.  Grip strength 5/5 bilaterally.  MSK Limited US of Shoulder, right  Patient was seated on exam table and shoulder US examination was performed using high frequency linear probe.   Biceps tendon- visualized within the bicipital groove in both longitudinal and transverse axis with very small amount of fluid.   No hypoechoic changes. Tendon fibers intact without signs of irregularity.  Subscapularis tendon- visualized in both longitudinal and transverse axis with intact tendon inserting at the inferior lesser tubercle of the humerus. No signs of tear, no hypoechoic changes or tissue irregularity were seen.  Supraspinatus tendon- visualized in longitudinal, transverse, and dynamic views. No signs of tear with the tendon inserting at the superior facet of the greater tubercle of the humerus, no hypoechoic changes or tissue irregularity seen.  Few hyperechoic bone spurs present in both longitudinal and transverse axis seen on the bursal side of the tendon.  Small amount of echoic bursal fluid present on the superior aspect of the tendon. Infraspinatus tendon- visualized in both longitudinal and transverse axis with tendon insertion at the middle facet of the great tubercle of the humerus with no signs of tearing, hypoechoic changes or tissue irregularity seen.  Posterior glenohumeral joint- visualized with proper alignment.  AC joint- visualized with small amount bursal distension.  Moderate amount of bony spurs/arthritic changes.   IMPRESSION: Overall, grossly normal complete U/S examination of the shoulder with signs of bursitis and osteoarthritis.    Assessment & Plan:   Problem List Items Addressed This Visit       Other   Acute pain of right shoulder - Primary    Right shoulder bursitis, unfortunately he has failed conservative measures including physical  therapy and oral medications.  He underwent subacromial injection today, detailed above.  Recommend he continue with ice, ibuprofen as needed and physical therapy.  Hopeful this will help to subside his symptoms of pain.  Follow-up in 6 weeks if symptoms worsen or fail to improve.  At that time we could consider further further imaging x-ray versus MRI.  He verbalized understanding.      Relevant Orders   [place] COMPLETE JOINT SPACE STRUCTURES UP RIGHT    After informed written consent timeout was performed, patient was seated in exam room.  Right shoulder was prepped with alcohol swab. Patient's right subacromial space was injected with 4:1 lidocaine: depomedrol. Patient tolerated the procedure well without immediate complications.   Return in about 6 weeks (around 05/08/2023), or or sooner if symptoms worsen or fail to improve.    Claudie Leach, DO  Addendum:  I was the preceptor for this visit and available for immediate consultation.  Norton Blizzard MD Marrianne Mood

## 2023-03-27 NOTE — Assessment & Plan Note (Signed)
Right shoulder bursitis, unfortunately he has failed conservative measures including physical therapy and oral medications.  He underwent subacromial injection today, detailed above.  Recommend he continue with ice, ibuprofen as needed and physical therapy.  Hopeful this will help to subside his symptoms of pain.  Follow-up in 6 weeks if symptoms worsen or fail to improve.  At that time we could consider further further imaging x-ray versus MRI.  He verbalized understanding.

## 2023-03-30 ENCOUNTER — Other Ambulatory Visit: Payer: Self-pay | Admitting: Cardiovascular Disease

## 2023-04-01 ENCOUNTER — Other Ambulatory Visit: Payer: Self-pay

## 2023-04-01 MED ORDER — ROSUVASTATIN CALCIUM 5 MG PO TABS: 5.0000 mg | ORAL_TABLET | ORAL | 0 refills | Status: DC

## 2023-05-08 ENCOUNTER — Ambulatory Visit: Payer: Medicare Other | Admitting: Family Medicine

## 2023-05-08 VITALS — BP 112/72 | Ht 70.0 in | Wt 120.0 lb

## 2023-05-08 DIAGNOSIS — G8929 Other chronic pain: Secondary | ICD-10-CM

## 2023-05-08 DIAGNOSIS — M25511 Pain in right shoulder: Secondary | ICD-10-CM | POA: Diagnosis not present

## 2023-05-08 NOTE — Progress Notes (Signed)
PCP: Ardith Dark, MD  Subjective:   HPI: Patient is a 77 y.o. [name] here for right shoulder pain follow-up.  Patient has had now 2 months of right shoulder pain.  He received a right subacromial steroid injection on 03/27/2023 and reports that he had significant improvement in his pain though some continued deep achy pain.  He ran a 10K on July 4 which she states reaggravated the pain.  Locates the pain to anterior and posterior aspect of the right shoulder.  He has been working with Ellamae Sia for physical therapy over the past 2 months has graduated to just doing home exercises.  He has been taking Tylenol and ibuprofen occasionally which has been helpful though is looking for more long-term relief.  He has not had prior radiographs of this right shoulder.   Past Medical History:  Diagnosis Date   Acute pericarditis    a. admitted as a code STEMI but no sig CAD on cath.    CAD (coronary artery disease)    30% stenosis mid RCA   Colonic polyp    Dr. Kinnie Scales; hyperplastic polyp   [name]    Hyperlipidemia     Current Outpatient Medications on File Prior to Visit  Medication Sig Dispense Refill   Ascorbic Acid (VITA-C PO) Take 1 tablet by mouth daily.     aspirin 81 MG tablet Take 81 mg by mouth daily.       Cholecalciferol (VITAMIN D) 2000 UNITS CAPS Take 2,000 Units by mouth daily.      fish oil-omega-3 fatty acids 1000 MG capsule Take 1 g by mouth daily.      Magnesium 250 MG TABS Take 250 mg by mouth daily.      metroNIDAZOLE (METROCREAM) 0.75 % cream Apply 1 application topically as needed.     Multiple Vitamins-Minerals (MENS 50+ MULTI VITAMIN/MIN PO) Take 1 tablet daily by mouth.     NON FORMULARY      pantoprazole (PROTONIX) 40 MG tablet TAKE 1 TABLET(40 MG) BY MOUTH DAILY 90 tablet 1   rosuvastatin (CRESTOR) 5 MG tablet Take 1 tablet (5 mg total) by mouth 3 (three) times a week. 36 tablet 0   Turmeric (QC TUMERIC COMPLEX PO) Take by mouth.     Zinc 25 MG TABS  Take 1 tablet by mouth daily.     No current facility-administered medications on file prior to visit.    Past Surgical History:  Procedure Laterality Date   actinic keratoses     facial removed x 2, Dr Danella Deis   basal cell cancer  2013 & 2015   Dr Campbell Stall   CARDIAC CATHETERIZATION N/A 10/30/2015   Procedure: Left Heart Cath and Coronary Angiography;  Surgeon: Kathleene Hazel, MD;  Location: [facility] INVASIVE CV LAB;  Service: Cardiovascular;  Laterality: N/A;   COLONOSCOPY  2014   negative   COLONOSCOPY W/ POLYPECTOMY  2004   @ age 76, Dr Kinnie Scales. ? Hyperplastic polyp   FLEXIBLE SIGMOIDOSCOPY  1999   negative   KNEE ARTHROSCOPY  1984   right    Allergies  Allergen Reactions   Codeine Anaphylaxis    Mental status changes   Niacin Other (See Comments)    flushing    BP 112/72   Ht 5\' 10"  (1.778 m)   Wt 120 lb (54.4 kg)   BMI 17.22 kg/m      08/11/2020    9:16 AM 05/30/2021   10:19 AM 1/Trevor/2023   11:31 AM  [facility] Adult Exercise  Frequency of aerobic exercise (# of days/week) 5 3 5   Average time in minutes 60 60 60  Frequency of strengthening activities (# of days/week) 2 2 1         No data to display              Objective:  Physical Exam:  Gen: NAD, comfortable in exam room Pulmonary: Normal work of breathing, symmetric chest rise Skin: Skin is warm, well-perfused and extremities. Neurologic: Alert and oriented.  Right Shoulder MSK Exam: No swelling, ecchymoses.  No gross deformity. Mild tenderness to palpation of the posterior shoulder. Mild limitation with forward flexion, abduction, and internal rotation.  Normal range of motion with external rotation. Negative Hawkins, Neers. Negative Yergasons. Strength 5/5 with empty can and resisted internal/external rotation. Mild discomfort with cross body testing. NV intact distally. Grip strength 5 out of 5 bilaterally.   Assessment & Plan:  1. 1. Chronic right shoulder pain Some  right shoulder pain persists despite now nearly 10 weeks of physical therapy and a subacromial injection on 03/27/2023 though did have some improvement in his impingement testing on exam.  Suspect his continued pain to be more driven by intra-articular pathology such as osteoarthritis.  Will proceed with right shoulder x-ray and patient will follow-up in 1 to 2 weeks for consideration of USG glenohumeral injection.  If failing to improve would recommend consideration of MRI in the future. - DG Shoulder Right; Future

## 2023-05-09 ENCOUNTER — Ambulatory Visit
Admission: RE | Admit: 2023-05-09 | Discharge: 2023-05-09 | Disposition: A | Discharge status: Home / Self Care | Payer: Medicare Other | Source: Ambulatory Visit | Attending: Family Medicine | Admitting: Family Medicine

## 2023-05-09 ENCOUNTER — Encounter: Payer: Self-pay | Admitting: Family Medicine

## 2023-05-09 DIAGNOSIS — G8929 Other chronic pain: Secondary | ICD-10-CM

## 2023-05-14 ENCOUNTER — Other Ambulatory Visit: Payer: Self-pay | Admitting: *Deleted

## 2023-05-18 ENCOUNTER — Other Ambulatory Visit: Payer: Self-pay | Admitting: Family Medicine

## 2023-05-20 ENCOUNTER — Ambulatory Visit: Payer: Medicare Other | Admitting: Family Medicine

## 2023-05-20 ENCOUNTER — Other Ambulatory Visit: Payer: Self-pay

## 2023-05-20 VITALS — BP 118/76 | Ht 70.0 in | Wt 120.0 lb

## 2023-05-20 DIAGNOSIS — M25511 Pain in right shoulder: Secondary | ICD-10-CM

## 2023-05-20 DIAGNOSIS — G8929 Other chronic pain: Secondary | ICD-10-CM

## 2023-05-20 MED ORDER — METHYLPREDNISOLONE ACETATE 40 MG/ML IJ SUSP
40.0000 mg | Freq: Once | INTRAMUSCULAR | Status: AC
  Administered 2023-05-20: 40 mg via INTRA_ARTICULAR

## 2023-05-21 NOTE — Progress Notes (Signed)
Patient returns today for intraarticular shoulder injection after last visit, review of recent x-rays.  After informed written consent timeout was performed, patient was seated in chair in exam room. Right shoulder was prepped with alcohol swab and utilizing ultrasound guidance, patient's right glenohumeral space was injected with 3:1 lidocaine: depomedrol. Patient tolerated the procedure well without immediate complications.

## 2023-06-11 ENCOUNTER — Ambulatory Visit: Payer: Medicare Other | Admitting: Cardiovascular Disease

## 2023-08-02 ENCOUNTER — Telehealth: Payer: Self-pay | Admitting: Family Medicine

## 2023-08-02 NOTE — Telephone Encounter (Signed)
Needs to speak to Dr

## 2023-08-05 ENCOUNTER — Encounter: Payer: Self-pay | Admitting: Family Medicine

## 2023-08-05 ENCOUNTER — Ambulatory Visit (INDEPENDENT_AMBULATORY_CARE_PROVIDER_SITE_OTHER): Payer: Medicare Other | Admitting: Family Medicine

## 2023-08-05 ENCOUNTER — Ambulatory Visit (HOSPITAL_COMMUNITY): Admission: RE | Admit: 2023-08-05 | Payer: Medicare Other | Source: Ambulatory Visit

## 2023-08-05 VITALS — BP 118/70 | HR 67 | Temp 97.3°F | Ht 70.0 in | Wt 147.2 lb

## 2023-08-05 DIAGNOSIS — I2510 Atherosclerotic heart disease of native coronary artery without angina pectoris: Secondary | ICD-10-CM | POA: Diagnosis not present

## 2023-08-05 DIAGNOSIS — R739 Hyperglycemia, unspecified: Secondary | ICD-10-CM | POA: Diagnosis not present

## 2023-08-05 DIAGNOSIS — E785 Hyperlipidemia, unspecified: Secondary | ICD-10-CM

## 2023-08-05 DIAGNOSIS — H5340 Unspecified visual field defects: Secondary | ICD-10-CM

## 2023-08-05 NOTE — Assessment & Plan Note (Signed)
Follows with cardiology.  On aspirin 325 mg daily and Crestor 5 mg 3 times weekly.  Depending on above stroke workup may need to be more aggressive with lipid control.

## 2023-08-05 NOTE — Assessment & Plan Note (Signed)
Last A1c 6.1 without medications.

## 2023-08-05 NOTE — Assessment & Plan Note (Signed)
See above problems.  Currently on Crestor 5 mg 3 times weekly per cardiology.  Depending on results of above MRI may need to be more aggressive with lipid management.

## 2023-08-05 NOTE — Progress Notes (Signed)
   Trevor [name] is a 77 y.o. male who presents today for an office visit.  Assessment/Plan:  New/Acute Problems: Visual Field Deficit  Patient with right homonymous hemianopsia per ophthalmology and needs neurologic evaluation.  Overall reassuring neurologic exam today.  His symptoms have been present for weeks to months-do not think we need to do any emergency workup at this point.  We will proceed with brain MRI to rule out stroke, mass, etc. depending on results may need to be referred to neurology.  We discussed reasons to return to care and seek emergent care.  His ophthalmologist recommended that he start aspirin 325 mg daily.  He is prescribed Crestor 5 mg 3 times daily per cardiology.  Chronic Problems Addressed Today: CAD (coronary artery disease) Follows with cardiology.  On aspirin 325 mg daily and Crestor 5 mg 3 times weekly.  Depending on above stroke workup may need to be more aggressive with lipid control.  Hyperlipidemia with target LDL less than 70 See above problems.  Currently on Crestor 5 mg 3 times weekly per cardiology.  Depending on results of above MRI may need to be more aggressive with lipid management.  Hyperglycemia Last A1c 6.1 without medications.     Subjective:  HPI:  See A/P for status of chronic conditions.  Patient is here today for follow-up for concern for decreased visual acuity.  He has been following with ophthalmology for cataracts.  Underwent surgery 4 months ago.  He was still having issues with visual acuity and underwent visual field testing last week which showed right homonymous hemianopsia.  Ophthalmology was concerned about possible neurologic etiology and was advised to follow-up with [place] for stroke evaluation   Patient needs his wife are concerned his symptoms may be due to a consequence of COVID that he had a few years ago.  Since then he has noticed diminished eyesight and also some more memory issues. He has not had any weakness or  numbness.  No headache.  No dysarthria.       Objective:  Physical Exam: BP 118/70   Pulse 67   Temp (!) 97.3 F (36.3 C)   Ht 5\' 10"  (1.778 m)   Wt 147 lb 3.2 oz (66.8 kg)   SpO2 98%   BMI 21.12 kg/m   Gen: No acute distress, resting comfortably CV: Regular rate and rhythm with no murmurs appreciated Pulm: Normal work of breathing, clear to auscultation bilaterally with no crackles, wheezes, or rhonchi Neuro: Visual acuity grossly intact.  Cranial nerves II through XII intact.  Finger-nose - finger testing intact bilaterally.  Strength 5 out of 5 in upper and lower extremities.  Sensation light touch intact throughout. Psych: Normal affect and thought content       Maymie Brunke M. [name], MD 08/05/2023 1:36 PM

## 2023-08-05 NOTE — Patient Instructions (Addendum)
It was very nice to see you today!  We will order an MRI.  Depending on results we may need to have you see a neurologist.  Take care, Dr [name]  PLEASE NOTE:  If you had any lab tests, please let us know if you have not heard back within a few days. You may see your results on mychart before we have a chance to review them but we will give you a call once they are reviewed by [place].   If we ordered any referrals today, please let us know if you have not heard from their office within the next week.   If you had any urgent prescriptions sent in today, please check with the pharmacy within an hour of our visit to make sure the prescription was transmitted appropriately.   Please try these tips to maintain a healthy lifestyle:  Eat at least 3 REAL meals and 1-2 snacks per day.  Aim for no more than 5 hours between eating.  If you eat breakfast, please do so within one hour of getting up.   Each meal should contain half fruits/vegetables, one quarter protein, and one quarter carbs (no bigger than a computer mouse)  Cut down on sweet beverages. This includes juice, soda, and sweet tea.   Drink at least 1 glass of water with each meal and aim for at least 8 glasses per day  Exercise at least 150 minutes every week.

## 2023-08-07 ENCOUNTER — Telehealth: Payer: Self-pay | Admitting: Family Medicine

## 2023-08-07 ENCOUNTER — Ambulatory Visit (HOSPITAL_COMMUNITY)
Admission: RE | Admit: 2023-08-07 | Discharge: 2023-08-07 | Disposition: A | Discharge status: Home / Self Care | Payer: Medicare Other | Source: Ambulatory Visit | Attending: Family Medicine | Admitting: Family Medicine

## 2023-08-07 DIAGNOSIS — H5340 Unspecified visual field defects: Secondary | ICD-10-CM | POA: Diagnosis present

## 2023-08-07 NOTE — Telephone Encounter (Signed)
Patient states he had his MRI done at [facility] today (08/07/23) at 12:00 pm

## 2023-08-08 NOTE — Telephone Encounter (Signed)
FYI

## 2023-08-08 NOTE — Telephone Encounter (Signed)
Pt read the message and will think about the neurologist referral. He will call us back if he would like for [place] to proceed with referral.

## 2023-08-08 NOTE — Telephone Encounter (Signed)
Noted  

## 2023-08-08 NOTE — Telephone Encounter (Signed)
Please see result note.  Trevor [name]. [name], MD 08/08/2023 3:02 PM

## 2023-08-08 NOTE — Progress Notes (Signed)
His MRI did not show any stroke.  He does have some age-related changes however these are chronic in nature and do not explain his change in vision.  No obvious explanation for change in vision based on his symptoms.  If he is still having ongoing visual issues it may be a good idea for [place] to refer him to see a neurologist to look for any other possible causes.  Please place referral if he is agreeable.

## 2023-08-20 ENCOUNTER — Telehealth: Payer: Self-pay | Admitting: Family Medicine

## 2023-08-20 NOTE — Telephone Encounter (Signed)
Error

## 2023-08-23 ENCOUNTER — Telehealth: Payer: Self-pay | Admitting: Family Medicine

## 2023-08-23 NOTE — Telephone Encounter (Signed)
Patient has been trying to get MRI results faxed over to ophthalmologist in order to move forward with a procedure. Pt was directed to medical records but has been having trouble with having this completed. Pt states he was informed by someone that he could come by and fill out a form to have records sent over to desired location.   I have confirmed that pt can do so. Patient states they will be in on Monday to do so.

## 2023-08-26 NOTE — Telephone Encounter (Signed)
Pt stopped by to fill out ROI for self and Dr.Bradley Bowen  . ROI filled and sent over to medical records .

## 2023-10-07 ENCOUNTER — Ambulatory Visit (INDEPENDENT_AMBULATORY_CARE_PROVIDER_SITE_OTHER): Payer: Medicare Other | Admitting: Family Medicine

## 2023-10-07 ENCOUNTER — Encounter: Payer: Self-pay | Admitting: Family Medicine

## 2023-10-07 VITALS — BP 106/63 | HR 65 | Temp 97.5°F | Ht 70.0 in | Wt 145.0 lb

## 2023-10-07 DIAGNOSIS — Z0001 Encounter for general adult medical examination with abnormal findings: Secondary | ICD-10-CM

## 2023-10-07 DIAGNOSIS — I2510 Atherosclerotic heart disease of native coronary artery without angina pectoris: Secondary | ICD-10-CM | POA: Diagnosis not present

## 2023-10-07 DIAGNOSIS — R972 Elevated prostate specific antigen [PSA]: Secondary | ICD-10-CM | POA: Diagnosis not present

## 2023-10-07 DIAGNOSIS — R739 Hyperglycemia, unspecified: Secondary | ICD-10-CM | POA: Diagnosis not present

## 2023-10-07 DIAGNOSIS — Z1211 Encounter for screening for malignant neoplasm of colon: Secondary | ICD-10-CM

## 2023-10-07 DIAGNOSIS — H5340 Unspecified visual field defects: Secondary | ICD-10-CM | POA: Insufficient documentation

## 2023-10-07 DIAGNOSIS — E785 Hyperlipidemia, unspecified: Secondary | ICD-10-CM

## 2023-10-07 DIAGNOSIS — E559 Vitamin D deficiency, unspecified: Secondary | ICD-10-CM

## 2023-10-07 LAB — COMPREHENSIVE METABOLIC PANEL
ALT: 15 U/L (ref 0–53)
AST: 26 U/L (ref 0–37)
Albumin: 3.9 g/dL (ref 3.5–5.2)
Alkaline Phosphatase: 56 U/L (ref 39–117)
BUN: 18 mg/dL (ref 6–23)
CO2: 27 meq/L (ref 19–32)
Calcium: 8.4 mg/dL (ref 8.4–10.5)
Chloride: 102 meq/L (ref 96–112)
Creatinine, Ser: 0.92 mg/dL (ref 0.40–1.50)
GFR: 81.26 mL/min (ref >60.00)
Glucose, Bld: 90 mg/dL (ref 70–99)
Potassium: 4.3 meq/L (ref 3.5–5.1)
Sodium: 137 meq/L (ref 135–145)
Total Bilirubin: 1.3 mg/dL — ABNORMAL HIGH (ref 0.2–1.2)
Total Protein: 6 g/dL (ref 6.0–8.3)

## 2023-10-07 LAB — LIPID PANEL
Cholesterol: 204 mg/dL — ABNORMAL HIGH (ref 0–200)
HDL: 40.9 mg/dL (ref >39.00)
LDL Cholesterol: 144 mg/dL — ABNORMAL HIGH (ref 0–99)
NonHDL: 162.78
Total CHOL/HDL Ratio: 5
Triglycerides: 94 mg/dL (ref 0.0–149.0)
VLDL: 18.8 mg/dL (ref 0.0–40.0)

## 2023-10-07 LAB — CBC
HCT: 37.8 % — ABNORMAL LOW (ref 39.0–52.0)
Hemoglobin: 12.6 g/dL — ABNORMAL LOW (ref 13.0–17.0)
MCHC: 33.4 g/dL (ref 30.0–36.0)
MCV: 92.2 fL (ref 78.0–100.0)
Platelets: 262 10*3/uL (ref 150.0–400.0)
RBC: 4.1 Mil/uL — ABNORMAL LOW (ref 4.22–5.81)
RDW: 13.8 % (ref 11.5–15.5)
WBC: 4.7 10*3/uL (ref 4.0–10.5)

## 2023-10-07 LAB — PSA: PSA: 2.57 ng/mL (ref 0.10–4.00)

## 2023-10-07 LAB — VITAMIN D 25 HYDROXY (VIT D DEFICIENCY, FRACTURES): VITD: 61.58 ng/mL (ref 30.00–100.00)

## 2023-10-07 LAB — HEMOGLOBIN A1C: Hgb A1c MFr Bld: 6.1 % (ref 4.6–6.5)

## 2023-10-07 LAB — TSH: TSH: 4.97 u[IU]/mL (ref 0.35–5.50)

## 2023-10-07 NOTE — Assessment & Plan Note (Signed)
He has not had any change in symptoms since her last visit.  Has upcoming appoint with ophthalmology next week.  His MRI from a couple months ago was negative.  Will defer further management to ophthalmology though he will let us know if he needs any further assistance.

## 2023-10-07 NOTE — Assessment & Plan Note (Signed)
Check vitamin D. 

## 2023-10-07 NOTE — Assessment & Plan Note (Signed)
Check PSA. ?

## 2023-10-07 NOTE — Patient Instructions (Signed)
It was very nice to see you today!  We will check blood work today.  Please continue to work on diet and exercise.  We will order a Cologuard test as well.  I will see back in a year.  Come back to see me sooner if needed.  Return in about 1 year (around 10/06/2024) for Annual Physical.   Take care, Dr [name]  PLEASE NOTE:  If you had any lab tests, please let us know if you have not heard back within a few days. You may see your results on mychart before we have a chance to review them but we will give you a call once they are reviewed by [place].   If we ordered any referrals today, please let us know if you have not heard from their office within the next week.   If you had any urgent prescriptions sent in today, please check with the pharmacy within an hour of our visit to make sure the prescription was transmitted appropriately.   Please try these tips to maintain a healthy lifestyle:  Eat at least 3 REAL meals and 1-2 snacks per day.  Aim for no more than 5 hours between eating.  If you eat breakfast, please do so within one hour of getting up.   Each meal should contain half fruits/vegetables, one quarter protein, and one quarter carbs (no bigger than a computer mouse)  Cut down on sweet beverages. This includes juice, soda, and sweet tea.   Drink at least 1 glass of water with each meal and aim for at least 8 glasses per day  Exercise at least 150 minutes every week.    Preventive Care 81 Years and Older, Male Preventive care refers to lifestyle choices and visits with your health care provider that can promote health and wellness. Preventive care visits are also called wellness exams. What can I expect for my preventive care visit? Counseling During your preventive care visit, your health care provider may ask about your: Medical history, including: Past medical problems. Family medical history. History of falls. Current health, including: Emotional well-being. Home  life and relationship well-being. Sexual activity. Memory and ability to understand (cognition). Lifestyle, including: Alcohol, nicotine or tobacco, and drug use. Access to firearms. Diet, exercise, and sleep habits. Work and work Astronomer. Sunscreen use. Safety issues such as seatbelt and bike helmet use. Physical exam Your health care provider will check your: Height and weight. These may be used to calculate your BMI (body mass index). BMI is a measurement that tells if you are at a healthy weight. Waist circumference. This measures the distance around your waistline. This measurement also tells if you are at a healthy weight and may help predict your risk of certain diseases, such as type 2 diabetes and high blood pressure. Heart rate and blood pressure. Body temperature. Skin for abnormal spots. What immunizations do I need?  Vaccines are usually given at various ages, according to a schedule. Your health care provider will recommend vaccines for you based on your age, medical history, and lifestyle or other factors, such as travel or where you work. What tests do I need? Screening Your health care provider may recommend screening tests for certain conditions. This may include: Lipid and cholesterol levels. Diabetes screening. This is done by checking your blood sugar (glucose) after you have not eaten for a while (fasting). Hepatitis C test. Hepatitis B test. HIV (human immunodeficiency virus) test. STI (sexually transmitted infection) testing, if you are at risk.  Lung cancer screening. Colorectal cancer screening. Prostate cancer screening. Abdominal aortic aneurysm (AAA) screening. You may need this if you are a current or former smoker. Talk with your health care provider about your test results, treatment options, and if necessary, the need for more tests. Follow these instructions at home: Eating and drinking  Eat a diet that includes fresh fruits and vegetables,  whole grains, lean protein, and low-fat dairy products. Limit your intake of foods with high amounts of sugar, saturated fats, and salt. Take vitamin and mineral supplements as recommended by your health care provider. Do not drink alcohol if your health care provider tells you not to drink. If you drink alcohol: Limit how much you have to 0-2 drinks a day. Know how much alcohol is in your drink. In the [place] one drink equals one 12 oz bottle of beer (355 mL), one 5 oz glass of wine (148 mL), or one 1 oz glass of hard liquor (44 mL). Lifestyle Brush your teeth every morning and night with fluoride toothpaste. Floss one time each day. Exercise for at least 30 minutes 5 or more days each week. Do not use any products that contain nicotine or tobacco. These products include cigarettes, chewing tobacco, and vaping devices, such as e-cigarettes. If you need help quitting, ask your health care provider. Do not use drugs. If you are sexually active, practice safe sex. Use a condom or other form of protection to prevent STIs. Take aspirin only as told by your health care provider. Make sure that you understand how much to take and what form to take. Work with your health care provider to find out whether it is safe and beneficial for you to take aspirin daily. Ask your health care provider if you need to take a cholesterol-lowering medicine (statin). Find healthy ways to manage stress, such as: Meditation, yoga, or listening to music. Journaling. Talking to a trusted person. Spending time with friends and family. Safety Always wear your seat belt while driving or riding in a vehicle. Do not drive: If you have been drinking alcohol. Do not ride with someone who has been drinking. When you are tired or distracted. While texting. If you have been using any mind-altering substances or drugs. Wear a helmet and other protective equipment during sports activities. If you have firearms in your house,  make sure you follow all gun safety procedures. Minimize exposure to UV radiation to reduce your risk of skin cancer. What's next? Visit your health care provider once a year for an annual wellness visit. Ask your health care provider how often you should have your eyes and teeth checked. Stay up to date on all vaccines. This information is not intended to replace advice given to you by your health care provider. Make sure you discuss any questions you have with your health care provider. Document Revised: 04/12/2021 Document Reviewed: 04/12/2021 Elsevier Patient Education  2024 [employer].

## 2023-10-07 NOTE — Assessment & Plan Note (Signed)
Follows with cardiology.  On Crestor 5 mg 3 times weekly and aspirin 81 mg daily.

## 2023-10-07 NOTE — Assessment & Plan Note (Signed)
Check A1c. 

## 2023-10-07 NOTE — Progress Notes (Signed)
Chief Complaint:  Trevor [name] is a 77 y.o. male who presents today for his annual comprehensive physical exam.    Assessment/Plan:  Chronic Problems Addressed Today: Visual field defect He has not had any change in symptoms since her last visit.  Has upcoming appoint with ophthalmology next week.  His MRI from a couple months ago was negative.  Will defer further management to ophthalmology though he will let us know if he needs any further assistance.  Hyperlipidemia with target LDL less than 70 Check lipids.  He is on Crestor 5 mg 3 times weekly.  CAD (coronary artery disease) Follows with cardiology.  On Crestor 5 mg 3 times weekly and aspirin 81 mg daily.  Hyperglycemia Check A1c.   Vitamin D deficiency Check vitamin D.  Elevated PSA Check PSA.  Preventative Healthcare: Check labs. Will order Cologuard for colon cancer screening.  Flu shot declined.  Up-to-date on  other vaccines.  Patient Counseling(The following topics were reviewed and/or handout was given):  -Nutrition: Stressed importance of moderation in sodium/caffeine intake, saturated fat and cholesterol, caloric balance, sufficient intake of fresh fruits, vegetables, and fiber.  -Stressed the importance of regular exercise.   -Substance Abuse: Discussed cessation/primary prevention of tobacco, alcohol, or other drug use; driving or other dangerous activities under the influence; availability of treatment for abuse.   -Injury prevention: Discussed safety belts, safety helmets, smoke detector, smoking near bedding or upholstery.   -Sexuality: Discussed sexually transmitted diseases, partner selection, use of condoms, avoidance of unintended pregnancy and contraceptive alternatives.   -Dental health: Discussed importance of regular tooth brushing, flossing, and dental visits.  -Health maintenance and immunizations reviewed. Please refer to Health maintenance section.  Return to care in 1 year for next  preventative visit.     Subjective:  HPI:  He has no acute complaints today. We last saw him a couple of months ago for urgent follow up from ophthalmology for concern for right homonymous hemianopsia. We ordered an urgent MRI which showed atrophy and chronic ischemia but no signs of stroke or other acute abnormalities. We did recommend referral to neurology however he declined.  He has not had any further symptoms since our last visit.  He will be following up with ophthalmology next week.  Overall feels like his vision is at baseline.  Has not had any other weakness or numbness or any other changes symptoms since our last visit.  Lifestyle Diet: Balanced. Plenty of fruits and vegetables.  Exercise: Running and going to [facility] routinely.      10/07/2023    9:39 AM  Depression screen PHQ 2/9  Decreased Interest 0  Down, Depressed, Hopeless 0  PHQ - 2 Score 0    Health Maintenance Due  Topic Date Due   Fecal DNA (Cologuard)  Never done     ROS: Per HPI, otherwise a complete review of systems was negative.   PMH:  The following were reviewed and entered/updated in epic: Past Medical History:  Diagnosis Date   Acute pericarditis    a. admitted as a code STEMI but no sig CAD on cath.    CAD (coronary artery disease)    30% stenosis mid RCA   Colonic polyp    Dr. Kinnie Scales; hyperplastic polyp   [name]    Hyperlipidemia    Patient Active Problem List   Diagnosis Date Noted   Visual field defect 10/07/2023   Elevated PSA 10/07/2023   Vitamin D deficiency 10/05/2022   Hyperglycemia 09/20/2020  Gastroesophageal reflux disease without esophagitis 09/03/2016   Exercise-induced asthma 03/14/2016   CAD (coronary artery disease)    [name] 09/06/2014   History of basal cell cancer 08/31/2013   Hyperplastic colonic polyp 09/09/2008   Hyperlipidemia with target LDL less than 70 09/01/2007   BPH without obstruction/lower urinary tract symptoms 09/01/2007   Past  Surgical History:  Procedure Laterality Date   actinic keratoses     facial removed x 2, Dr Danella Deis   basal cell cancer  2013 & 2015   Dr Campbell Stall   CARDIAC CATHETERIZATION N/A 10/30/2015   Procedure: Left Heart Cath and Coronary Angiography;  Surgeon: Kathleene Hazel, MD;  Location: [facility] INVASIVE CV LAB;  Service: Cardiovascular;  Laterality: N/A;   COLONOSCOPY  2014   negative   COLONOSCOPY W/ POLYPECTOMY  2004   @ age 37, Dr Kinnie Scales. ? Hyperplastic polyp   FLEXIBLE SIGMOIDOSCOPY  1999   negative   KNEE ARTHROSCOPY  1984   right    Family History  Problem Relation Age of Onset   Stroke Father 42   Alcohol abuse Father    Asthma Brother    Breast cancer Mother    HIV Brother    Prostate cancer Brother 26   Breast cancer Maternal Aunt    Breast cancer Maternal Grandmother    Heart disease Neg Hx    Diabetes Neg Hx     Medications- reviewed and updated Current Outpatient Medications  Medication Sig Dispense Refill   Ascorbic Acid (VITA-C PO) Take 1 tablet by mouth daily.     aspirin 81 MG tablet Take 81 mg by mouth daily.       Cholecalciferol (VITAMIN D) 2000 UNITS CAPS Take 2,000 Units by mouth daily.      fish oil-omega-3 fatty acids 1000 MG capsule Take 1 g by mouth daily.      Magnesium 250 MG TABS Take 250 mg by mouth daily.      metroNIDAZOLE (METROCREAM) 0.75 % cream Apply 1 application topically as needed.     Multiple Vitamins-Minerals (MENS 50+ MULTI VITAMIN/MIN PO) Take 1 tablet daily by mouth.     NON FORMULARY      pantoprazole (PROTONIX) 40 MG tablet TAKE 1 TABLET(40 MG) BY MOUTH DAILY 90 tablet 1   Turmeric (QC TUMERIC COMPLEX PO) Take by mouth.     Zinc 25 MG TABS Take 1 tablet by mouth daily.     rosuvastatin (CRESTOR) 5 MG tablet Take 1 tablet (5 mg total) by mouth 3 (three) times a week. (Patient not taking: Reported on 10/07/2023) 36 tablet 0   No current facility-administered medications for this visit.    Allergies-reviewed and  updated Allergies  Allergen Reactions   Codeine Anaphylaxis    Mental status changes   Niacin Other (See Comments)    flushing    Social History   Socioeconomic History   Marital status: Married    Spouse name: Not on file   Number of children: Not on file   Years of education: Not on file   Highest education level: Not on file  Occupational History   Occupation: retired   Tobacco Use   Smoking status: Former    Current packs/day: 0.00    Average packs/day: 1 pack/day for 13.0 years (13.0 ttl pk-yrs)    Types: Cigarettes    Start date: 10/29/1965    Quit date: 10/29/1978    Years since quitting: 44.9   Smokeless tobacco: Never   Tobacco  comments:    smoked 1967-1980, up to 1 ppd  Vaping Use   Vaping status: Never Used  Substance and Sexual Activity   Alcohol use: No    Alcohol/week: 0.0 standard drinks of alcohol   Drug use: No   Sexual activity: Not on file  Other Topics Concern   Not on file  Social History Narrative   Originally from [place]. Moved to [place] in 1951. He moved to  in 1984. 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 Previously has traveled to [place], [place], [place], [place], [place], and [place]. Normally has a cockatiel but not currently No mold exposure. Has a hot tub but doesn't use it regularly. No children    Social Determinants of Health   Financial Resource Strain: Low Risk  (12/25/2022)   Overall Financial Resource Strain (CARDIA)    Difficulty of Paying Living Expenses: Not hard at all  Food Insecurity: No Food Insecurity (12/25/2022)   Hunger Vital Sign    Worried About Running Out of Food in the Last Year: Never true    Ran Out of Food in the Last Year: Never true  Transportation Needs: No Transportation Needs (12/25/2022)   PRAPARE - Administrator, Civil Service (Medical): No    Lack of Transportation (Non-Medical): No  Physical Activity: Sufficiently Active (12/25/2022)    Exercise Vital Sign    Days of Exercise per Week: 5 days    Minutes of Exercise per Session: 90 min  Stress: No Stress Concern Present (12/25/2022)   [employer] of Occupational Health - Occupational Stress Questionnaire    Feeling of Stress : Not at all  Social Connections: Socially Integrated (12/25/2022)   Social Connection and Isolation Panel [NHANES]    Frequency of Communication with Friends and Family: More than three times a week    Frequency of Social Gatherings with Friends and Family: More than three times a week    Attends Religious Services: More than 4 times per year    Active Member of [employer] or Organizations: Yes    Attends Banker Meetings: 1 to 4 times per year    Marital Status: Married        Objective:  Physical Exam: BP 106/63   Pulse 65   Temp (!) 97.5 F (36.4 C) (Temporal)   Ht 5\' 10"  (1.778 m)   Wt 145 lb (65.8 kg)   SpO2 99%   BMI 20.81 kg/m   Body mass index is 20.81 kg/m. Wt Readings from Last 3 Encounters:  10/07/23 145 lb (65.8 kg)  08/05/23 147 lb 3.2 oz (66.8 kg)  05/20/23 120 lb (54.4 kg)   Gen: NAD, resting comfortably HEENT: TMs normal bilaterally. OP clear. No thyromegaly noted.  CV: RRR with no murmurs appreciated Pulm: NWOB, CTAB with no crackles, wheezes, or rhonchi GI: Normal bowel sounds present. Soft, Nontender, Nondistended. MSK: no edema, cyanosis, or clubbing noted Skin: warm, dry Neuro: CN2-12 grossly intact. Strength 5/5 in upper and lower extremities. Reflexes symmetric and intact bilaterally.  Psych: Normal affect and thought content     Misako Roeder M. [name], MD 10/07/2023 10:20 AM

## 2023-10-07 NOTE — Assessment & Plan Note (Signed)
Check lipids.  He is on Crestor 5 mg 3 times weekly.

## 2023-10-10 NOTE — Progress Notes (Signed)
His cholesterol is up a bit since last time we checked.  Please verify that he has been taking his Crestor as prescribed?  Recommend he follow-up with cardiology to discuss changing the dose because his levels are not quite at goal.  His A1c is borderline elevated but stable compared to the last couple of years.  Do not need to start meds but he should work on diet and exercise for this.  PSA is much better than last year when we checked.  He is slightly anemic but this is stable compared to the last few years as well.  The rest of his labs are at goal.  Do not need to make any other adjustments to his treatment plan at this time.  We can recheck everything in a year.

## 2023-10-15 ENCOUNTER — Telehealth: Payer: Self-pay | Admitting: Family Medicine

## 2023-10-15 NOTE — Telephone Encounter (Signed)
Patient returned call. Requests to be called. 

## 2023-10-15 NOTE — Telephone Encounter (Signed)
See results note. 

## 2023-10-29 NOTE — Telephone Encounter (Signed)
 Copied from CRM [id number]. Topic: General - Other >> Oct 29, 2023  8:04 AM Victoria A wrote: Reason for CRM: Patient called and requested if he can have medication prescribed for sneezing,coughing and congestion. Patient states that he has had symptoms for about week and a half.   Patient need OV, please schedule  Alphonse Asbridge,RMA

## 2023-11-04 ENCOUNTER — Encounter: Payer: Self-pay | Admitting: Cardiovascular Disease

## 2023-11-04 ENCOUNTER — Ambulatory Visit (INDEPENDENT_AMBULATORY_CARE_PROVIDER_SITE_OTHER): Payer: Medicare Other | Admitting: Internal Medicine

## 2023-11-04 ENCOUNTER — Ambulatory Visit: Payer: Medicare Other | Attending: Cardiovascular Disease | Admitting: Cardiovascular Disease

## 2023-11-04 ENCOUNTER — Encounter: Payer: Self-pay | Admitting: Internal Medicine

## 2023-11-04 VITALS — BP 122/71 | HR 77 | Temp 97.5°F | Ht 70.0 in | Wt 144.8 lb

## 2023-11-04 VITALS — BP 120/68 | HR 77 | Ht 70.0 in | Wt 144.0 lb

## 2023-11-04 DIAGNOSIS — I2510 Atherosclerotic heart disease of native coronary artery without angina pectoris: Secondary | ICD-10-CM

## 2023-11-04 DIAGNOSIS — J440 Chronic obstructive pulmonary disease with acute lower respiratory infection: Secondary | ICD-10-CM | POA: Diagnosis not present

## 2023-11-04 DIAGNOSIS — J40 Bronchitis, not specified as acute or chronic: Secondary | ICD-10-CM | POA: Diagnosis not present

## 2023-11-04 DIAGNOSIS — J209 Acute bronchitis, unspecified: Secondary | ICD-10-CM | POA: Insufficient documentation

## 2023-11-04 DIAGNOSIS — K219 Gastro-esophageal reflux disease without esophagitis: Secondary | ICD-10-CM | POA: Diagnosis not present

## 2023-11-04 NOTE — Patient Instructions (Signed)
 Medication Instructions:  No changes *If you need a refill on your cardiac medications before your next appointment, please call your pharmacy*   Lab Work: none   Testing/Procedures: none   Follow-Up: At [facility], you and your health needs are our priority.  As part of our continuing mission to provide you with exceptional heart care, we have created designated Provider Care Teams.  These Care Teams include your primary Cardiologist (physician) and Advanced Practice Providers (APPs -  Physician Assistants and Nurse Practitioners) who all work together to provide you with the care you need, when you need it.   Your next appointment:   12 month(s)  Provider:   Lonni Cash, MD     Other Instructions You have been referred to clinical pharmacy team to discuss Repatha.

## 2023-11-04 NOTE — Assessment & Plan Note (Signed)
 He is currently asymptomatic from exercise-induced asthma,  possibly resolved, an active runner without recent symptoms and no current use of inhalers or asthma medications. No wheezing was noted on examination. No immediate intervention is required, but we will monitor for any future symptoms, especially during exercise.

## 2023-11-04 NOTE — Assessment & Plan Note (Signed)
 Gastroesophageal Reflux Disease (GERD)   He has GERD with occasional acid reflux but no recent exacerbation of symptoms. The plan is to continue current management for GERD and monitor for any changes in symptoms.

## 2023-11-04 NOTE — Patient Instructions (Signed)
 VISIT SUMMARY:  During today's visit, we discussed your persistent cough and phlegm production that has been ongoing for about a month. You mentioned that your symptoms have been improving over the past two days and that you have been able to maintain your active lifestyle. We also reviewed your history of exercise-induced asthma and GERD, both of which are currently stable.  YOUR PLAN:  -PERSISTENT BRONCHITIS: Persistent bronchitis is an inflammation of the bronchial tubes, often due to an infection, leading to a cough and phlegm production. We recommend conservative management with over-the-counter cough suppressants like Robitussin, staying hydrated, and resting. If your symptoms persist or worsen, please contact us  for further evaluation.  -EXERCISE-INDUCED ASTHMA: Exercise-induced asthma is a condition where physical activity triggers asthma symptoms. Currently, you are asymptomatic and do not require any immediate intervention. We will continue to monitor for any future symptoms, especially during exercise.  -GASTROESOPHAGEAL REFLUX DISEASE (GERD): GERD is a condition where stomach acid frequently flows back into the esophagus, causing irritation. Your symptoms are currently stable, and we recommend continuing your current management plan and monitoring for any changes.  -GENERAL HEALTH MAINTENANCE: You are generally healthy and active. We encourage you to continue your regular exercise and balanced diet, avoid known GERD triggers, and maintain good hydration.  INSTRUCTIONS:  We will reassess your condition in 1-2 weeks if your symptoms persist or worsen. Please call or use MyChart if your symptoms worsen for further evaluation and potential diagnostics.

## 2023-11-04 NOTE — Progress Notes (Signed)
 ==============================   [facility]: [id number]   -- Medical Office Visit --  Patient: Trevor [name]      Age: 78 y.o.       Sex:  male  Date:   11/04/2023 Today's Healthcare Provider: Bernardino KANDICE Cone, MD  ==============================   CHIEF COMPLAINT: Chest congestion (Lingering for two weeks.)   SUBJECTIVE: 78 y.o. male who has Hyperlipidemia with target LDL less than 70; BPH without obstruction/lower urinary tract symptoms; Hyperplastic colonic polyp; History of basal cell cancer; Gilbert syndrome; CAD (coronary artery disease); Exercise-induced asthma; Gastroesophageal reflux disease without esophagitis; Hyperglycemia; Vitamin D  deficiency; Visual field defect; Elevated PSA; and Acute bronchitis with COPD (HCC) on their problem list.  History of Present Illness (HPI):  A 78 year old male presents with a two-week history of a productive cough. He initially expectorated clear sputum but now notes brown-colored phlegm. He denies fever, chills, night sweats, myalgias, or swollen lymph nodes. He reports mild malaise but no significant shortness of breath, wheezing, or chest tightness. He denies any recent travel,  or changes in his home environment. His wife has been sick with similar.   He improved a lot in last few days, so wasn't sure if he should even have kept his appointment. He has no history of allergies, asthma, or smoking/inhaled substance use. He reports no occupational exposures. He denies any recent changes in medications.  Review of Systems:  Constitutional: Malaise, fatigue. Respiratory: Productive cough with brown sputum, no wheezing, shortness of breath, or chest tightness. Cardiovascular: No chest pain, palpitations, or edema. Gastrointestinal: Denies heartburn, acid reflux, or dyspepsia. Neurological: No headache, dizziness, or confusion.  Note that patient  has a past medical history of Acute pericarditis, CAD  (coronary artery disease), Colonic polyp, Gilbert syndrome, and Hyperlipidemia.  Problem list overviews that were updated at today's visit: Problem  Acute Bronchitis With Copd (Hcc)    Med reconciliation: Current Outpatient Medications on File Prior to Visit  Medication Sig   Ascorbic Acid  (VITA-C PO) Take 1 tablet by mouth daily.   aspirin  81 MG tablet Take 81 mg by mouth daily.     Cholecalciferol  (VITAMIN D ) 2000 UNITS CAPS Take 2,000 Units by mouth daily.    fish oil-omega-3 fatty acids  1000 MG capsule Take 1 g by mouth daily.    Magnesium  250 MG TABS Take 250 mg by mouth daily.    metroNIDAZOLE (METROCREAM) 0.75 % cream Apply 1 application topically as needed.   Multiple Vitamins-Minerals (MENS 50+ MULTI VITAMIN/MIN PO) Take 1 tablet daily by mouth.   NON FORMULARY    pantoprazole  (PROTONIX ) 40 MG tablet TAKE 1 TABLET(40 MG) BY MOUTH DAILY   Turmeric (QC TUMERIC COMPLEX PO) Take by mouth.   Zinc 25 MG TABS Take 1 tablet by mouth daily.   No current facility-administered medications on file prior to visit.  There are no discontinued medications.    Objective   Physical Exam     11/04/2023    1:22 PM 11/04/2023   10:22 AM 10/07/2023    9:27 AM  Vitals with BMI  Height 5' 10 5' 10 5' 10  Weight 144 lbs 13 oz 144 lbs 145 lbs  BMI 20.78 20.66 20.81  Systolic 122 120 893  Diastolic 71 68 63  Pulse 77 77 65   Wt Readings from Last 10 Encounters:  11/04/23 144 lb 12.8 oz (65.7 kg)  11/04/23 144 lb (65.3 kg)  10/07/23 145 lb (65.8 kg)  08/05/23 147 lb  3.2 oz (66.8 kg)  05/20/23 120 lb (54.4 kg)  05/08/23 120 lb (54.4 kg)  03/27/23 120 lb (54.4 kg)  12/25/22 120 lb (54.4 kg)  12/25/22 151 lb (68.5 kg)  10/05/22 151 lb 3.2 oz (68.6 kg)   Vital signs reviewed.  Nursing notes reviewed. Weight trend reviewed. Abnormalities and Problem-Specific physical exam findings:  HEENT: Throat normal. Nasal mucosa clear. CHEST: Lungs clear to auscultation. No lymphadenopathy cervical   General Appearance:  No acute distress appreciable.   Well-groomed, healthy-appearing male.  Well proportioned with no abnormal fat distribution.  Good muscle tone. Pulmonary:  Normal work of breathing at rest, no respiratory distress apparent. SpO2: 99 %  Musculoskeletal: All extremities are intact.  Neurological:  Awake, alert, oriented, and engaged.  No obvious focal neurological deficits or cognitive impairments.  Sensorium seems unclouded.   Speech is clear and coherent with logical content. Psychiatric:  Appropriate mood, pleasant and cooperative demeanor, thoughtful and engaged during the exam    No results found for any visits on 11/04/23. Office Visit on 10/07/2023  Component Date Value   WBC 10/07/2023 4.7    RBC 10/07/2023 4.10 (L)    Platelets 10/07/2023 262.0    Hemoglobin 10/07/2023 12.6 (L)    HCT 10/07/2023 37.8 (L)    MCV 10/07/2023 92.2    MCHC 10/07/2023 33.4    RDW 10/07/2023 13.8    Sodium 10/07/2023 137    Potassium 10/07/2023 4.3    Chloride 10/07/2023 102    CO2 10/07/2023 27    Glucose, Bld 10/07/2023 90    BUN 10/07/2023 18    Creatinine, Ser 10/07/2023 0.92    Total Bilirubin 10/07/2023 1.3 (H)    Alkaline Phosphatase 10/07/2023 56    AST 10/07/2023 26    ALT 10/07/2023 15    Total Protein 10/07/2023 6.0    Albumin 10/07/2023 3.9    GFR 10/07/2023 81.26    Calcium  10/07/2023 8.4    Hgb A1c MFr Bld 10/07/2023 6.1    TSH 10/07/2023 4.97    PSA 10/07/2023 2.57    Cholesterol 10/07/2023 204 (H)    Triglycerides 10/07/2023 94.0    HDL 10/07/2023 40.90    VLDL 10/07/2023 18.8    LDL Cholesterol 10/07/2023 144 (H)    Total CHOL/HDL Ratio 10/07/2023 5    NonHDL 10/07/2023 162.78    VITD 10/07/2023 61.58   No image results found. MR Brain Wo Contrast Result Date: 08/07/2023 CLINICAL DATA:  Initial evaluation for acute neuro deficit, stroke suspected. EXAM: MRI HEAD WITHOUT CONTRAST TECHNIQUE: Multiplanar, multiecho pulse sequences of the brain and  surrounding structures were obtained without intravenous contrast. COMPARISON:  Prior head CT from 12/22/2001. FINDINGS: Brain: Mild age-related cerebral atrophy. Patchy T2/FLAIR hyperintensity involving the periventricular and deep white matter both cerebral hemispheres, consistent with chronic small vessel ischemic disease, mild-to-moderate in nature. No evidence for acute or subacute infarct. Gray-white matter differentiation maintained. No acute or chronic intracranial blood products. No mass lesion, midline shift or mass effect. No hydrocephalus or extra-axial fluid collection. Pituitary gland and suprasellar region within normal limits. Vascular: Major intracranial vascular flow voids are maintained. Skull and upper cervical spine: Craniocervical junction level limits. Bone marrow signal intensity normal. No scalp soft tissue abnormality. Sinuses/Orbits: Prior ocular lens replacement on the right. Paranasal sinuses are clear. Trace right mastoid effusion, of doubtful significance. Other: None. IMPRESSION: 1. No acute intracranial abnormality. 2. Age-related cerebral atrophy with mild-to-moderate chronic microvascular ischemic disease. Electronically Signed   By: Morene Hoard  M.D.   On: 08/07/2023 17:45  MR Brain Wo Contrast Result Date: 08/07/2023 CLINICAL DATA:  Initial evaluation for acute neuro deficit, stroke suspected. EXAM: MRI HEAD WITHOUT CONTRAST TECHNIQUE: Multiplanar, multiecho pulse sequences of the brain and surrounding structures were obtained without intravenous contrast. COMPARISON:  Prior head CT from 12/22/2001. FINDINGS: Brain: Mild age-related cerebral atrophy. Patchy T2/FLAIR hyperintensity involving the periventricular and deep white matter both cerebral hemispheres, consistent with chronic small vessel ischemic disease, mild-to-moderate in nature. No evidence for acute or subacute infarct. Gray-white matter differentiation maintained. No acute or chronic intracranial blood  products. No mass lesion, midline shift or mass effect. No hydrocephalus or extra-axial fluid collection. Pituitary gland and suprasellar region within normal limits. Vascular: Major intracranial vascular flow voids are maintained. Skull and upper cervical spine: Craniocervical junction level limits. Bone marrow signal intensity normal. No scalp soft tissue abnormality. Sinuses/Orbits: Prior ocular lens replacement on the right. Paranasal sinuses are clear. Trace right mastoid effusion, of doubtful significance. Other: None. IMPRESSION: 1. No acute intracranial abnormality. 2. Age-related cerebral atrophy with mild-to-moderate chronic microvascular ischemic disease. Electronically Signed   By: Morene Hoard M.D.   On: 08/07/2023 17:45       Assessment & Plan Bronchitis Persistent Bronchitis   He has had persistent bronchitis for about a month, likely of infectious origin, characterized by frequent cough, brown phlegm, and malaise, without significant shortness of breath, chest pain, or fever. Auscultation reveals clear lungs, indicating no pneumonia. Differential diagnosis includes postnasal drip and GERD exacerbation, though these are less likely. We discussed conservative management, including OTC cough suppressants like Robitussin, staying hydrated, and resting. We will reassess in 1-2 weeks if symptoms persist or worsen. He is instructed to call or use MyChart for worsening symptoms for further evaluation and potential diagnostics, including blood work, x-rays, and sputum cultures, though not recommended at this time due to improvement. He agreed with the plan and expressed understanding. Acute bronchitis with COPD (HCC) He is currently asymptomatic from exercise-induced asthma,  possibly resolved, an active runner without recent symptoms and no current use of inhalers or asthma medications. No wheezing was noted on examination. No immediate intervention is required, but we will monitor for any  future symptoms, especially during exercise. Gastroesophageal reflux disease without esophagitis Gastroesophageal Reflux Disease (GERD)   He has GERD with occasional acid reflux but no recent exacerbation of symptoms. The plan is to continue current management for GERD and monitor for any changes in symptoms.     Orders Placed During this Encounter:  No orders of the defined types were placed in this encounter.  No orders of the defined types were placed in this encounter.   General Health Maintenance   He is generally healthy, active, and retired, with no recent travel, new pets, or home renovations, and no use of vapes, cannabis, or other inhaled substances. We encourage a continued healthy lifestyle with regular exercise and a balanced diet, advising to avoid known GERD triggers and maintain good hydration.  Follow-up   We will reassess in 1-2 weeks if symptoms persist or worsen. He is instructed to call or use MyChart if symptoms worsen for further evaluation and potential diagnostics.    This document was synthesized by artificial intelligence (Abridge) using HIPAA-compliant recording of the clinical interaction;   We discussed the use of AI scribe software for clinical note transcription with the patient, who gave verbal consent to proceed.    Additional Info: This encounter employed state-of-the-art, real-time, collaborative documentation. The patient  actively reviewed and assisted in updating their electronic medical record on a shared screen, ensuring transparency and facilitating joint problem-solving for the problem list, overview, and plan. This approach promotes accurate, informed care. The treatment plan was discussed and reviewed in detail, including medication safety, potential side effects, and all patient questions. We confirmed understanding and comfort with the plan. Follow-up instructions were established, including contacting the office for any concerns, returning if  symptoms worsen, persist, or new symptoms develop, and precautions for potential emergency department visits.

## 2023-11-04 NOTE — Progress Notes (Signed)
 Chief Complaint  Patient presents with   Follow-up    CAD    History of Present Illness: 78 yo male with history of HLD, mild CAD and pericarditis here today for follow up. He was admitted to [facility] in January 2017 with chest pain and acute ST changes. Emergent cardiac cath with mild disease mid RCA but no flow limiting lesions. Echo 10/31/15 with normal LV systolic function, moderate LVH, trivial AI. He was treated with an NSAID for possible acute pericarditis. He has not tolerated high dose statins but has tolerated low dose Crestor  recently. Echo June 2022 with LVEF=65%. Mid mitral regurgitation.   He is here today for follow up. The patient denies any chest pain, dyspnea, palpitations, lower extremity edema, orthopnea, PND, dizziness, near syncope or syncope. He runs 25-30 miles per week.   Primary Care Physician: Kennyth Worth HERO, MD  Past Medical History:  Diagnosis Date   Acute pericarditis    a. admitted as a code STEMI but no sig CAD on cath.    CAD (coronary artery disease)    30% stenosis mid RCA   Colonic polyp    Dr. Luis; hyperplastic polyp   Bertrum syndrome    Hyperlipidemia     Past Surgical History:  Procedure Laterality Date   actinic keratoses     facial removed x 2, Dr Helga   basal cell cancer  2013 & 2015   Dr Lorrayne Helga   CARDIAC CATHETERIZATION N/A 10/30/2015   Procedure: Left Heart Cath and Coronary Angiography;  Surgeon: Lonni JONETTA Cash, MD;  Location: [facility] INVASIVE CV LAB;  Service: Cardiovascular;  Laterality: N/A;   COLONOSCOPY  2014   negative   COLONOSCOPY W/ POLYPECTOMY  2004   @ age 63, Dr Luis. ? Hyperplastic polyp   FLEXIBLE SIGMOIDOSCOPY  1999   negative   KNEE ARTHROSCOPY  1984   right    Current Outpatient Medications  Medication Sig Dispense Refill   Ascorbic Acid  (VITA-C PO) Take 1 tablet by mouth daily.     aspirin  81 MG tablet Take 81 mg by mouth daily.       Cholecalciferol  (VITAMIN D ) 2000 UNITS CAPS Take 2,000 Units  by mouth daily.      fish oil-omega-3 fatty acids  1000 MG capsule Take 1 g by mouth daily.      Magnesium  250 MG TABS Take 250 mg by mouth daily.      metroNIDAZOLE (METROCREAM) 0.75 % cream Apply 1 application topically as needed.     Multiple Vitamins-Minerals (MENS 50+ MULTI VITAMIN/MIN PO) Take 1 tablet daily by mouth.     NON FORMULARY      pantoprazole  (PROTONIX ) 40 MG tablet TAKE 1 TABLET(40 MG) BY MOUTH DAILY 90 tablet 1   Turmeric (QC TUMERIC COMPLEX PO) Take by mouth.     Zinc 25 MG TABS Take 1 tablet by mouth daily.     No current facility-administered medications for this visit.    Allergies  Allergen Reactions   Codeine Anaphylaxis    Mental status changes   Niacin Other (See Comments)    flushing    Social History   Socioeconomic History   Marital status: Married    Spouse name: Not on file   Number of children: Not on file   Years of education: Not on file   Highest education level: Not on file  Occupational History   Occupation: retired   Tobacco Use   Smoking status: Former  Current packs/day: 0.00    Average packs/day: 1 pack/day for 13.0 years (13.0 ttl pk-yrs)    Types: Cigarettes    Start date: 10/29/1965    Quit date: 10/29/1978    Years since quitting: 45.0   Smokeless tobacco: Never   Tobacco comments:    smoked 1967-1980, up to 1 ppd  Vaping Use   Vaping status: Never Used  Substance and Sexual Activity   Alcohol use: No    Alcohol/week: 0.0 standard drinks of alcohol   Drug use: No   Sexual activity: Not on file  Other Topics Concern   Not on file  Social History Narrative   Originally from [place]. Moved to [place] in 1951. He moved to [place] in 1984. 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 Previously has traveled to [place], [place], [place], [place], [place], [place] , and [place]. Normally has a cockatiel but not currently No mold exposure. Has a hot tub but doesn't use it regularly. No children     Social Drivers of Corporate Investment Banker Strain: Low Risk  (12/25/2022)   Overall Financial Resource Strain (CARDIA)    Difficulty of Paying Living Expenses: Not hard at all  Food Insecurity: No Food Insecurity (12/25/2022)   Hunger Vital Sign    Worried About Running Out of Food in the Last Year: Never true    Ran Out of Food in the Last Year: Never true  Transportation Needs: No Transportation Needs (12/25/2022)   PRAPARE - Administrator, Civil Service (Medical): No    Lack of Transportation (Non-Medical): No  Physical Activity: Sufficiently Active (12/25/2022)   Exercise Vital Sign    Days of Exercise per Week: 5 days    Minutes of Exercise per Session: 90 min  Stress: No Stress Concern Present (12/25/2022)   [employer] of Occupational Health - Occupational Stress Questionnaire    Feeling of Stress : Not at all  Social Connections: Socially Integrated (12/25/2022)   Social Connection and Isolation Panel [NHANES]    Frequency of Communication with Friends and Family: More than three times a week    Frequency of Social Gatherings with Friends and Family: More than three times a week    Attends Religious Services: More than 4 times per year    Active Member of [employer] or Organizations: Yes    Attends Banker Meetings: 1 to 4 times per year    Marital Status: Married  Catering Manager Violence: Not At Risk (12/25/2022)   Humiliation, Afraid, Rape, and Kick questionnaire    Fear of Current or Ex-Partner: No    Emotionally Abused: No    Physically Abused: No    Sexually Abused: No    Family History  Problem Relation Age of Onset   Stroke Father 11   Alcohol abuse Father    Asthma Brother    Breast cancer Mother    HIV Brother    Prostate cancer Brother 48   Breast cancer Maternal Aunt    Breast cancer Maternal Grandmother    Heart disease Neg Hx    Diabetes Neg Hx     Review of Systems:  As stated in the HPI and otherwise negative.   BP  120/68   Pulse 77   Ht 5' 10 (1.778 m)   Wt 65.3 kg   SpO2 99%   BMI 20.66 kg/m  Physical Examination: General: Well developed, well nourished, NAD  HEENT: OP clear, mucus membranes  moist  SKIN: warm, dry. No rashes. Neuro: No focal deficits  Musculoskeletal: Muscle strength 5/5 all ext  Psychiatric: Mood and affect normal  Neck: No JVD, no carotid bruits, no thyromegaly, no lymphadenopathy.  Lungs:Clear bilaterally, no wheezes, rhonci, crackles Cardiovascular: Regular rate and rhythm. No murmurs, gallops or rubs. Abdomen:Soft. Bowel sounds present. Non-tender.  Extremities: No lower extremity edema. Pulses are 2 + in the bilateral DP/PT.  EKG:  EKG is ordered today. The ekg ordered today demonstrates  Recent Labs: 10/07/2023: ALT 15; BUN 18; Creatinine, Ser 0.92; Hemoglobin 12.6; Platelets 262.0; Potassium 4.3; Sodium 137; TSH 4.97   Lipid Panel    Component Value Date/Time   CHOL 204 (H) 10/07/2023 1035   CHOL 164 03/26/2018 0919   TRIG 94.0 10/07/2023 1035   HDL 40.90 10/07/2023 1035   HDL 45 03/26/2018 0919   CHOLHDL 5 10/07/2023 1035   VLDL 18.8 10/07/2023 1035   LDLCALC 144 (H) 10/07/2023 1035   LDLCALC 109 (H) 09/20/2020 1011   LDLDIRECT 159.2 08/31/2013 0946     Wt Readings from Last 3 Encounters:  11/04/23 65.3 kg  10/07/23 65.8 kg  08/05/23 66.8 kg    Assessment and Plan:   1. CAD without angina: He has mild disease in the mid RCA by cath in 2017. No chest pain suggestive of angina. LV function normal by echo in 2022. Will continue ASA. He stopped his statin due to memory issues and he does not wish to restart.   2. Hyperlipidemia: Lipids followed in primary care. He does not tolerate higher doses of statins and has been off his statin for the past year. He had been taking Crestor . He was seen in the lipid clinic in our office in April 2021 and Repatha discussed but he chose not to start this. He is now willing to be seen in the lipid clinic again to  discuss Repatha.   Will make referral.   Labs/ tests ordered today include:   Orders Placed This Encounter  Procedures   AMB Referral to [facility]   EKG 12-Lead   Disposition:   F/U with me  in 12  months  Signed, Lonni Cash, MD 11/04/2023 12:07 PM    [facility] [address] [place], [place], [place]  [address] Phone: [phone]; Fax: [phone]

## 2023-11-04 NOTE — Assessment & Plan Note (Signed)
>>  ASSESSMENT AND PLAN FOR ACUTE BRONCHITIS WITH COPD (HCC) WRITTEN ON 11/04/2023  8:28 PM BY MORRISON, RYAN G, MD  He is currently asymptomatic from exercise-induced asthma,  possibly resolved, an active runner without recent symptoms and no current use of inhalers or asthma medications. No wheezing was noted on examination. No immediate intervention is required, but we will monitor for any future symptoms, especially during exercise.

## 2023-11-05 ENCOUNTER — Telehealth: Payer: Self-pay | Admitting: Family Medicine

## 2023-11-05 NOTE — Telephone Encounter (Signed)
 Left patient vm to call office to schedule appt.

## 2023-11-05 NOTE — Telephone Encounter (Signed)
 Copied from CRM [id number]. Topic: Clinical - Medical Advice >> Nov 05, 2023  8:46 AM Burnard DEL wrote: Reason for CRM: patient called in stating that he was seen yesterday and the provider that he seen did not think that he needed a shot he is requesting a phone call to speak with someone form the clinical team regarding the coughing,sneezing and congestion that he has been dealing with for 6 weeks now.He feel that he needs some type og injection to help get rid of what he is dealing with

## 2023-11-07 LAB — COLOGUARD: COLOGUARD: NEGATIVE

## 2023-11-08 ENCOUNTER — Encounter: Payer: Self-pay | Admitting: Family Medicine

## 2023-11-08 ENCOUNTER — Ambulatory Visit (INDEPENDENT_AMBULATORY_CARE_PROVIDER_SITE_OTHER): Payer: Medicare Other | Admitting: Family Medicine

## 2023-11-08 ENCOUNTER — Ambulatory Visit: Payer: Medicare Other | Admitting: Family Medicine

## 2023-11-08 VITALS — BP 110/67 | HR 61 | Temp 97.5°F | Resp 18 | Ht 70.0 in | Wt 144.5 lb

## 2023-11-08 DIAGNOSIS — J40 Bronchitis, not specified as acute or chronic: Secondary | ICD-10-CM

## 2023-11-08 MED ORDER — AZITHROMYCIN 250 MG PO TABS: ORAL_TABLET | ORAL | 0 refills | Status: DC

## 2023-11-08 MED ORDER — PREDNISONE 20 MG PO TABS: 40.0000 mg | ORAL_TABLET | Freq: Every day | ORAL | 0 refills | Status: AC

## 2023-11-08 NOTE — Patient Instructions (Signed)
 Zpak and prednisone sent to the pharmacy Finish the zpack If feeling MUCH better Sunday, can stop the prednisone

## 2023-11-08 NOTE — Progress Notes (Signed)
 Subjective:     Patient ID: Trevor [name], male    DOB: Feb 24, 1946, 78 y.o.   MRN: [id number]  Chief Complaint  Patient presents with   Cough    Ongoing cough for 2 1/2 months   Nasal Congestion    Nasal congestion and sneezing ongoing for 2 1/2 months     HPI Seen 1/6 for chest congestion for 2 wks and cough.  Some brown phlegm at first. No significan sob wife sick as well.  Exam was neg so no further tx rec x reassessment if persists.  Still w/nasal congestion/sneezing/coughing.  On-going now for almost 1 months.  Nonproductive cough now no f/c  still running  no sob. Just won't go away.  Health Maintenance Due  Topic Date Due   Medicare Annual Wellness (AWV)  12/26/2023    Past Medical History:  Diagnosis Date   Acute pericarditis    a. admitted as a code STEMI but no sig CAD on cath.    CAD (coronary artery disease)    30% stenosis mid RCA   Colonic polyp    Dr. Luis; hyperplastic polyp   Bertrum syndrome    Hyperlipidemia     Past Surgical History:  Procedure Laterality Date   actinic keratoses     facial removed x 2, Dr Helga   basal cell cancer  2013 & 2015   Dr Lorrayne Helga   CARDIAC CATHETERIZATION N/A 10/30/2015   Procedure: Left Heart Cath and Coronary Angiography;  Surgeon: Lonni JONETTA Cash, MD;  Location: [facility] INVASIVE CV LAB;  Service: Cardiovascular;  Laterality: N/A;   COLONOSCOPY  2014   negative   COLONOSCOPY W/ POLYPECTOMY  2004   @ age 38, Dr Luis. ? Hyperplastic polyp   FLEXIBLE SIGMOIDOSCOPY  1999   negative   KNEE ARTHROSCOPY  1984   right     Current Outpatient Medications:    Ascorbic Acid  (VITA-C PO), Take 1 tablet by mouth daily., Disp: , Rfl:    aspirin  81 MG tablet, Take 81 mg by mouth daily.  , Disp: , Rfl:    azithromycin  (ZITHROMAX ) 250 MG tablet, Take 2 tablets ( total 500 mg) PO on day 1, then take 1 tablet ( total 250 mg) by mouth q24h x 4 days., Disp: 6 tablet, Rfl: 0   Cholecalciferol  (VITAMIN D ) 2000 UNITS  CAPS, Take 2,000 Units by mouth daily. , Disp: , Rfl:    fish oil-omega-3 fatty acids  1000 MG capsule, Take 1 g by mouth daily. , Disp: , Rfl:    Magnesium  250 MG TABS, Take 250 mg by mouth daily. , Disp: , Rfl:    metroNIDAZOLE (METROCREAM) 0.75 % cream, Apply 1 application topically as needed., Disp: , Rfl:    Multiple Vitamins-Minerals (MENS 50+ MULTI VITAMIN/MIN PO), Take 1 tablet daily by mouth., Disp: , Rfl:    NON FORMULARY, , Disp: , Rfl:    pantoprazole  (PROTONIX ) 40 MG tablet, TAKE 1 TABLET(40 MG) BY MOUTH DAILY, Disp: 90 tablet, Rfl: 1   predniSONE  (DELTASONE ) 20 MG tablet, Take 2 tablets (40 mg total) by mouth daily with breakfast for 5 days., Disp: 10 tablet, Rfl: 0   Turmeric (QC TUMERIC COMPLEX PO), Take by mouth., Disp: , Rfl:    Zinc 25 MG TABS, Take 1 tablet by mouth daily., Disp: , Rfl:   Allergies  Allergen Reactions   Codeine Anaphylaxis    Mental status changes  Other Reaction(s): Psychosis   Famotidine  Shortness Of Breath  Rosuvastatin  Calcium      Other Reaction(s): Other (See Comments)  Severe fatigue   Niacin Other (See Comments)    flushing   ROS neg/noncontributory except as noted HPI/below      Objective:     BP 110/67   Pulse 61   Temp (!) 97.5 F (36.4 C) (Temporal)   Resp 18   Ht 5' 10 (1.778 m)   Wt 144 lb 8 oz (65.5 kg)   SpO2 100%   BMI 20.73 kg/m  Wt Readings from Last 3 Encounters:  11/08/23 144 lb 8 oz (65.5 kg)  11/04/23 144 lb 12.8 oz (65.7 kg)  11/04/23 144 lb (65.3 kg)    Physical Exam   Gen: WDWN NAD HEENT: NCAT, conjunctiva not injected, sclera nonicteric TM WNL B, OP moist, no exudates.  + runny nose  NECK:  supple, no thyromegaly, no nodes,  CARDIAC: RRR, S1S2+, no murmur. LUNGS: CTAB. No wheezes EXT:  no edema MSK: no gross abnormalities.  NEURO: A&O x3.  CN II-XII intact.  PSYCH: normal mood. Good eye contact     Assessment & Plan:  Bronchitis  Other orders -     Azithromycin ; Take 2 tablets ( total 500  mg) PO on day 1, then take 1 tablet ( total 250 mg) by mouth q24h x 4 days.  Dispense: 6 tablet; Refill: 0 -     predniSONE ; Take 2 tablets (40 mg total) by mouth daily with breakfast for 5 days.  Dispense: 10 tablet; Refill: 0  Bronchitis-may be viral but going on sev wks.  Will do pred 40mg  daily and zpk.  Worse, no change, let us  know  Return if symptoms worsen or fail to improve.  Jenkins CHRISTELLA Carrel, MD

## 2023-12-02 ENCOUNTER — Ambulatory Visit: Payer: Self-pay | Admitting: Family Medicine

## 2023-12-02 NOTE — Telephone Encounter (Signed)
  Chief Complaint: cough Symptoms: productive cough; fever, sneezing Frequency: comes and goes   Disposition: [] ED /[] Urgent Care (no appt availability in office) / [x] Appointment(In office/virtual)/ []  [place] [facility]/ [] Home Care/ [] Refused Recommended Disposition /[] [place] [facility]/ []  Follow-up with PCP Additional Notes: Pt wife Dois Davenport called on behalf of pt with concerns of his cough not improving.  Pt was seen in office on 1/10 with Bronchitis. Pt finished medication and symptoms cleared up some, but have since gotten worse. Pt has  had a fever on and off. Pt complains of mild SOB. Lucky Cowboy feels pt is very weak. Per protocol pt to be seen within 24 hours. Pt has appt with  PCP 2/4.  RN gave care advice and Pt verbalized understanding.          Copied from CRM [id number]. Topic: Clinical - Red Word Triage >> Dec 02, 2023  2:00 PM Rodman Pickle T wrote: [facility] that prompted transfer to Nurse Triage: patient is not feeling well short of breath coughing and has a fever Reason for Disposition  [1] Continuous (nonstop) coughing interferes with work or school AND [2] no improvement using cough treatment per Care Advice  Answer Assessment - Initial Assessment Questions 1. ONSET: "When did the cough begin?"  Couple of months 2. SEVERITY: "How bad is the cough today?"     Coughs real hard 3. SPUTUM: "Describe the color of your sputum" (none, dry cough; clear, white, yellow, green)    Little bit- unsure of color  4. HEMOPTYSIS: "Are you coughing up any blood?" If so ask: "How much?" (flecks, streaks, tablespoons, etc.)     na 5. DIFFICULTY BREATHING: "Are you having difficulty breathing?" If Yes, ask: "How bad is it?" (e.g., mild, moderate, severe)    - MILD: No SOB at rest, mild SOB with walking, speaks normally in sentences, can lie down, no retractions, pulse < 100.    - MODERATE: SOB at rest, SOB with minimal exertion and prefers to sit, cannot lie down flat, speaks in phrases,  mild retractions, audible wheezing, pulse 100-120.    - SEVERE: Very SOB at rest, speaks in single words, struggling to breathe, sitting hunched forward, retractions, pulse > 120      Mild  6. FEVER: "Do you have a fever?" If Yes, ask: "What is your temperature, how was it measured, and when did it start?"     101.4  7. CARDIAC HISTORY: "Do you have any history of heart disease?" (e.g., heart attack, congestive heart failure)      Denies  8. LUNG HISTORY: "Do you have any history of lung disease?"  (e.g., pulmonary embolus, asthma, emphysema)     Denies  9. PE RISK FACTORS: "Do you have a history of blood clots?" (or: recent major surgery, recent prolonged travel, bedridden)     Denies  10. OTHER SYMPTOMS: "Do you have any other symptoms?" (e.g., runny nose, wheezing, chest pain)       Fever, sneezing  Protocols used: Cough - Acute Non-Productive-A-AH

## 2023-12-02 NOTE — Telephone Encounter (Signed)
FYI pt scheduled for tomorrow

## 2023-12-03 ENCOUNTER — Ambulatory Visit: Payer: Medicare Other | Admitting: Family Medicine

## 2023-12-03 ENCOUNTER — Ambulatory Visit: Payer: Self-pay | Admitting: Family Medicine

## 2023-12-03 NOTE — Telephone Encounter (Signed)
 Copied From CRM [id number]. Reason for Triage: Patient is supposed to come in for an appointment today and he has started throwing up and is weak and cannot drive. He is losing weight. His temperature is normal but he is still coughing. Try the [phone] number first and then try the [phone] number if no one the first number.    Chief Complaint: cough off/on x 2 months Symptoms: weakness, vomiting today Frequency: persistent Pertinent Negatives: Patient denies chest pain Disposition: [] ED /[] Urgent Care (no appt availability in office) / [x] Appointment(In office/virtual)/ []  [place] [facility]/ [] Home Care/ [] Refused Recommended Disposition /[] [place] [facility]/ []  Follow-up with PCP Additional Notes: Patient was scheduled for PCP appt today, but felt weak and couldn't drive and vomited. Pt reports cough off/on x 2 months, seen last month for similar complaint and started on abx and steroid. Pt reports no improvement. Wife with patient expressed concern, states that pt has lot a lot of weight as well. Pt now scheduled for 2/5.   Reason for Disposition  Cough has been present for > 3 weeks  Answer Assessment - Initial Assessment Questions 1. ONSET: When did the cough begin?      Off/On x 2 months  2. SEVERITY: How bad is the cough today?      Getting worse, now vomiting  3. SPUTUM: Describe the color of your sputum (none, dry cough; clear, white, yellow, green)     Coughing up yellow-ish sputum  4. HEMOPTYSIS: Are you coughing up any blood? If so ask: How much? (flecks, streaks, tablespoons, etc.)     No blood  5. DIFFICULTY BREATHING: Are you having difficulty breathing? If Yes, ask: How bad is it? (e.g., mild, moderate, severe)    - MILD: No SOB at rest, mild SOB with walking, speaks normally in sentences, can lie down, no retractions, pulse < 100.    - MODERATE: SOB at rest, SOB with minimal exertion and prefers to sit, cannot lie down flat, speaks in  phrases, mild retractions, audible wheezing, pulse 100-120.    - SEVERE: Very SOB at rest, speaks in single words, struggling to breathe, sitting hunched forward, retractions, pulse > 120      No shortness of breath; but occasionally has to catch my breath  6. FEVER: Do you have a fever? If Yes, ask: What is your temperature, how was it measured, and when did it start?     Fever this past week, getting better yesterday but does seem to run fevers in the evening  7. CARDIAC HISTORY: Do you have any history of heart disease? (e.g., heart attack, congestive heart failure)      No  8. LUNG HISTORY: Do you have any history of lung disease?  (e.g., pulmonary embolus, asthma, emphysema)     No  9. PE RISK FACTORS: Do you have a history of blood clots? (or: recent major surgery, recent prolonged travel, bedridden)     No 10. OTHER SYMPTOMS: Do you have any other symptoms? (e.g., runny nose, wheezing, chest pain)       Vomiting today and  weakness  11. PREGNANCY: Is there any chance you are pregnant? When was your last menstrual period?       N/a  12. TRAVEL: Have you traveled out of the country in the last month? (e.g., travel history, exposures)       No  Protocols used: Cough - Acute Productive-A-AH

## 2023-12-03 NOTE — Telephone Encounter (Signed)
Pt is scheduled to be seen at the office tomorrow 2/5.

## 2023-12-04 ENCOUNTER — Ambulatory Visit (INDEPENDENT_AMBULATORY_CARE_PROVIDER_SITE_OTHER): Payer: Medicare Other

## 2023-12-04 ENCOUNTER — Ambulatory Visit (INDEPENDENT_AMBULATORY_CARE_PROVIDER_SITE_OTHER): Payer: Medicare Other | Admitting: Family Medicine

## 2023-12-04 ENCOUNTER — Encounter: Payer: Self-pay | Admitting: Family Medicine

## 2023-12-04 VITALS — BP 93/60 | HR 68 | Temp 97.5°F | Resp 16 | Ht 70.0 in | Wt 140.2 lb

## 2023-12-04 DIAGNOSIS — R634 Abnormal weight loss: Secondary | ICD-10-CM

## 2023-12-04 DIAGNOSIS — R052 Subacute cough: Secondary | ICD-10-CM

## 2023-12-04 LAB — COMPREHENSIVE METABOLIC PANEL
ALT: 27 U/L (ref 0–53)
AST: 42 U/L — ABNORMAL HIGH (ref 0–37)
Albumin: 4.2 g/dL (ref 3.5–5.2)
Alkaline Phosphatase: 60 U/L (ref 39–117)
BUN: 30 mg/dL — ABNORMAL HIGH (ref 6–23)
CO2: 28 meq/L (ref 19–32)
Calcium: 8.7 mg/dL (ref 8.4–10.5)
Chloride: 100 meq/L (ref 96–112)
Creatinine, Ser: 1.07 mg/dL (ref 0.40–1.50)
GFR: 67.71 mL/min (ref >60.00)
Glucose, Bld: 101 mg/dL — ABNORMAL HIGH (ref 70–99)
Potassium: 4.8 meq/L (ref 3.5–5.1)
Sodium: 135 meq/L (ref 135–145)
Total Bilirubin: 0.7 mg/dL (ref 0.2–1.2)
Total Protein: 7.1 g/dL (ref 6.0–8.3)

## 2023-12-04 LAB — CBC WITH DIFFERENTIAL/PLATELET
Basophils Absolute: 0 10*3/uL (ref 0.0–0.1)
Basophils Relative: 0.9 % (ref 0.0–3.0)
Eosinophils Absolute: 0.1 10*3/uL (ref 0.0–0.7)
Eosinophils Relative: 4.9 % (ref 0.0–5.0)
HCT: 40 % (ref 39.0–52.0)
Hemoglobin: 13.4 g/dL (ref 13.0–17.0)
Lymphocytes Relative: 35.4 % (ref 12.0–46.0)
Lymphs Abs: 1 10*3/uL (ref 0.7–4.0)
MCHC: 33.5 g/dL (ref 30.0–36.0)
MCV: 91.4 fL (ref 78.0–100.0)
Monocytes Absolute: 0.4 10*3/uL (ref 0.1–1.0)
Monocytes Relative: 13.5 % — ABNORMAL HIGH (ref 3.0–12.0)
Neutro Abs: 1.3 10*3/uL — ABNORMAL LOW (ref 1.4–7.7)
Neutrophils Relative %: 45.3 % (ref 43.0–77.0)
Platelets: 189 10*3/uL (ref 150.0–400.0)
RBC: 4.38 Mil/uL (ref 4.22–5.81)
RDW: 14.6 % (ref 11.5–15.5)
WBC: 2.9 10*3/uL — ABNORMAL LOW (ref 4.0–10.5)

## 2023-12-04 LAB — TSH: TSH: 7.31 u[IU]/mL — ABNORMAL HIGH (ref 0.35–5.50)

## 2023-12-04 MED ORDER — BENZONATATE 100 MG PO CAPS: 100.0000 mg | ORAL_CAPSULE | Freq: Three times a day (TID) | ORAL | 0 refills | Status: DC | PRN

## 2023-12-04 MED ORDER — CEFDINIR 300 MG PO CAPS: 300.0000 mg | ORAL_CAPSULE | Freq: Two times a day (BID) | ORAL | 0 refills | Status: DC

## 2023-12-04 NOTE — Progress Notes (Signed)
 Subjective:     Patient ID: Trevor [name], male    DOB: [birth year], 78 y.o.   MRN: [id number]  Chief Complaint  Patient presents with   Cough    Productive cough off and on for 2 months, has taken prednisone  and antibiotics before, started getting really bad 2 weeks ago, hacking cough    Sore Throat    Started this morning    HPI Seen on 1/6 and 1/10 and placed on zpk and pred  Discussed the use of AI scribe software for clinical note transcription with the patient, who gave verbal consent to proceed.  History of Present Illness   Trevor [name] is a 78 year old male who presents with worsening cough and congestion. He is accompanied by his wife, Particia.  He has been experiencing a worsening cough and congestion for the past two months. Despite a course of azithromycin  (Z-Pak) and prednisone , there has been no improvement. The cough has intensified, and he has been expectorating more sputum for the past week. He also reports a new onset of throat pain, which he describes as localized and possibly related to his glands. No shortness of breath is noted, but he feels very weak and has been unable to run for the past five days due to his illness. Frequent sneezing and a runny nose are also present, although the persistent cough is the primary concern.  Over the past week, he has experienced fevers with temperatures ranging from 99.90F to 101.32F. He denies any swelling in the legs and has not experienced vomiting or diarrhea until yesterday when he coughed so hard that he vomited, an occurrence he states has never happened before.  He has a history of gastroesophageal reflux disease (GERD) and was previously prescribed pantoprazole  in July, although he does not recall taking it. He is allergic to codeine, famotidine , and rosuvastatin . He has been using over-the-counter medications like Mucinex to manage his symptoms.  He quit smoking 50 years ago and denies any current  smoking or history of asthma. He reports some memory problems and often defers to his wife for answers.       Health Maintenance Due  Topic Date Due   Medicare Annual Wellness (AWV)  12/26/2023    Past Medical History:  Diagnosis Date   Acute pericarditis    a. admitted as a code STEMI but no sig CAD on cath.    CAD (coronary artery disease)    30% stenosis mid RCA   Colonic polyp    Dr. Luis; hyperplastic polyp   Bertrum syndrome    Hyperlipidemia     Past Surgical History:  Procedure Laterality Date   actinic keratoses     facial removed x 2, Dr Helga   basal cell cancer  2013 & 2015   Dr Lorrayne Helga   CARDIAC CATHETERIZATION N/A 10/30/2015   Procedure: Left Heart Cath and Coronary Angiography;  Surgeon: Lonni JONETTA Cash, MD;  Location: [facility] INVASIVE CV LAB;  Service: Cardiovascular;  Laterality: N/A;   COLONOSCOPY  2014   negative   COLONOSCOPY W/ POLYPECTOMY  2004   @ age 44, Dr Luis. ? Hyperplastic polyp   FLEXIBLE SIGMOIDOSCOPY  1999   negative   KNEE ARTHROSCOPY  1984   right     Current Outpatient Medications:    Ascorbic Acid  (VITA-C PO), Take 1 tablet by mouth daily., Disp: , Rfl:    aspirin  81 MG tablet, Take 81 mg by mouth daily.  ,  Disp: , Rfl:    benzonatate  (TESSALON  PERLES) 100 MG capsule, Take 1 capsule (100 mg total) by mouth 3 (three) times daily as needed., Disp: 20 capsule, Rfl: 0   cefdinir  (OMNICEF ) 300 MG capsule, Take 1 capsule (300 mg total) by mouth 2 (two) times daily., Disp: 14 capsule, Rfl: 0   Cholecalciferol  (VITAMIN D ) 2000 UNITS CAPS, Take 2,000 Units by mouth daily. , Disp: , Rfl:    fish oil-omega-3 fatty acids  1000 MG capsule, Take 1 g by mouth daily. , Disp: , Rfl:    Magnesium  250 MG TABS, Take 250 mg by mouth daily. , Disp: , Rfl:    metroNIDAZOLE (METROCREAM) 0.75 % cream, Apply 1 application topically as needed., Disp: , Rfl:    Multiple Vitamins-Minerals (MENS 50+ MULTI VITAMIN/MIN PO), Take 1 tablet daily by mouth.,  Disp: , Rfl:    NON FORMULARY, , Disp: , Rfl:    pantoprazole  (PROTONIX ) 40 MG tablet, TAKE 1 TABLET(40 MG) BY MOUTH DAILY, Disp: 90 tablet, Rfl: 1   Turmeric (QC TUMERIC COMPLEX PO), Take by mouth., Disp: , Rfl:    Zinc 25 MG TABS, Take 1 tablet by mouth daily., Disp: , Rfl:   Allergies  Allergen Reactions   Codeine Anaphylaxis    Mental status changes  Other Reaction(s): Psychosis   Famotidine  Shortness Of Breath   Rosuvastatin  Calcium      Other Reaction(s): Other (See Comments)  Severe fatigue   Niacin Other (See Comments)    flushing   ROS neg/noncontributory except as noted HPI/below      Objective:     BP 93/60   Pulse 68   Temp (!) 97.5 F (36.4 C) (Temporal)   Resp 16   Ht 5' 10 (1.778 m)   Wt 140 lb 4 oz (63.6 kg)   SpO2 98%   BMI 20.12 kg/m  Wt Readings from Last 3 Encounters:  12/04/23 140 lb 4 oz (63.6 kg)  11/08/23 144 lb 8 oz (65.5 kg)  11/04/23 144 lb 12.8 oz (65.7 kg)    Physical Exam   Gen: WDWN NAD HEENT: NCAT, conjunctiva not injected, sclera nonicteric TM WNL B, OP moist, no exudates  NECK:  supple, no thyromegaly, no nodes,  CARDIAC: RRR, S1S2+, no murmur.  LUNGS: CTAB. No wheezes EXT:  no edema MSK: no gross abnormalities.  NEURO: A&O x3.  CN II-XII intact.  PSYCH: normal mood. Good eye contact     Assessment & Plan:  Subacute cough -     DG Chest 2 View; Future -     CBC with Differential/Platelet -     QuantiFERON-TB Gold Plus  Weight loss -     DG Chest 2 View; Future -     Comprehensive metabolic panel -     CBC with Differential/Platelet -     TSH -     QuantiFERON-TB Gold Plus  Other orders -     Cefdinir ; Take 1 capsule (300 mg total) by mouth 2 (two) times daily.  Dispense: 14 capsule; Refill: 0 -     Benzonatate ; Take 1 capsule (100 mg total) by mouth 3 (three) times daily as needed.  Dispense: 20 capsule; Refill: 0  Assessment and Plan    Chronic Cough The cough has persisted for two months, worsening  despite azithromycin  and prednisone . Symptoms include productive cough, throat pain, fever up to 101.57F, and recent vomiting from severe coughing, but no shortness of breath. Differential diagnosis includes infection, reflux, or other respiratory conditions.  A chest x-ray is needed to rule out pneumonia or other serious conditions. Omnicef  is prescribed as a potentially more effective antibiotic, with possible side effects such as gastrointestinal upset and allergic reactions. Monitor for severe symptoms and seek emergency care if necessary. Order a chest x-ray and blood work. Prescribe Omnicef  to be picked up at [facility] at [facility] and [place], and [place]  Perles for cough. Advise going to the emergency room if symptoms worsen significantly, such as high fever or difficulty breathing.  Gastroesophageal Reflux Disease (GERD) GERD may contribute to the cough. Pantoprazole  was prescribed in July, but it is unclear if it was taken consistently. .  General Health Maintenance There is a smoking history from 50 years ago, with no recent chest x-ray or lung cancer screening.  Follow-up Call with results of the chest x-ray and blood work. Print the after-visit summary.      He has also lost some weight-need to check labs and cxr  Return if symptoms worsen or fail to improve.  Jenkins CHRISTELLA Carrel, MD

## 2023-12-04 NOTE — Progress Notes (Signed)
 Call wife-no access to my chart-Chest x-ray ok.  Still take the antibiotics.  Wbc low, liver test slightly elevated and thyroid  off-may all be illness or other.  Please sch appt soon with Dr. Daneil Dunker for follow up

## 2023-12-04 NOTE — Patient Instructions (Addendum)
 Antibiotics sent  Results will be called to you

## 2023-12-07 LAB — QUANTIFERON-TB GOLD PLUS
Mitogen-NIL: 4.63 [IU]/mL
NIL: 0.07 [IU]/mL
QuantiFERON-TB Gold Plus: NEGATIVE
TB1-NIL: <0 [IU]/mL
TB2-NIL: <0 [IU]/mL

## 2023-12-24 ENCOUNTER — Ambulatory Visit: Payer: Medicare Other | Attending: Cardiovascular Disease

## 2023-12-24 NOTE — Progress Notes (Deleted)
 Patient ID: HARM JOU                 DOB: 06/11/1946                    MRN: [id number]      HPI: Trevor [name] is a 78 y.o. male patient referred to lipid clinic by Dr. Clifton James. PMH is significant for HLD, mild CAD and pericarditis here today for follow up. He was admitted to [facility] in January 2017 with chest pain and acute ST changes. Emergent cardiac cath with mild disease mid RCA but no flow limiting lesions.   I saw patient in clinic 3 years ago. We discussed PCKS9i but patient ultimately decided against it.   [employer] medicare 375 deductible 45/month  Reviewed options for lowering LDL cholesterol, including ezetimibe, PCSK-9 inhibitors, bempedoic acid and inclisiran.  Discussed mechanisms of action, dosing, side effects and potential decreases in LDL cholesterol.  Also reviewed cost information and potential options for patient assistance.   Current Medications: none Intolerances: rousuvastatin 5mg  and 10mg  daily, rousuvastatin 5mg  three times a week, (memory issues) pitavastatin 1mg  daily (insomnia, jittery)  Risk Factors: CAD, age LDL-C goal: <70 ApoB goal: <80  Diet:   Exercise:   Family History:   Social History:   Labs: Lipid Panel     Component Value Date/Time   CHOL 204 (H) 10/07/2023 1035   CHOL 164 03/26/2018 0919   TRIG 94.0 10/07/2023 1035   HDL 40.90 10/07/2023 1035   HDL 45 03/26/2018 0919   CHOLHDL 5 10/07/2023 1035   VLDL 18.8 10/07/2023 1035   LDLCALC 144 (H) 10/07/2023 1035   LDLCALC 109 (H) 09/20/2020 1011   LDLDIRECT 159.2 08/31/2013 0946   LABVLDL 18 03/26/2018 0919    Past Medical History:  Diagnosis Date   Acute pericarditis    a. admitted as a code STEMI but no sig CAD on cath.    CAD (coronary artery disease)    30% stenosis mid RCA   Colonic polyp    Dr. Kinnie Scales; hyperplastic polyp   [name]    Hyperlipidemia     Current Outpatient Medications on File Prior to Visit  Medication Sig Dispense Refill    Ascorbic Acid (VITA-C PO) Take 1 tablet by mouth daily.     aspirin 81 MG tablet Take 81 mg by mouth daily.       benzonatate (TESSALON PERLES) 100 MG capsule Take 1 capsule (100 mg total) by mouth 3 (three) times daily as needed. 20 capsule 0   cefdinir (OMNICEF) 300 MG capsule Take 1 capsule (300 mg total) by mouth 2 (two) times daily. 14 capsule 0   Cholecalciferol (VITAMIN D) 2000 UNITS CAPS Take 2,000 Units by mouth daily.      fish oil-omega-3 fatty acids 1000 MG capsule Take 1 g by mouth daily.      Magnesium 250 MG TABS Take 250 mg by mouth daily.      metroNIDAZOLE (METROCREAM) 0.75 % cream Apply 1 application topically as needed.     Multiple Vitamins-Minerals (MENS 50+ MULTI VITAMIN/MIN PO) Take 1 tablet daily by mouth.     NON FORMULARY      pantoprazole (PROTONIX) 40 MG tablet TAKE 1 TABLET(40 MG) BY MOUTH DAILY 90 tablet 1   Turmeric (QC TUMERIC COMPLEX PO) Take by mouth.     Zinc 25 MG TABS Take 1 tablet by mouth daily.     No current facility-administered medications on file prior  to visit.    Allergies  Allergen Reactions   Codeine Anaphylaxis    Mental status changes  Other Reaction(s): Psychosis   Famotidine Shortness Of Breath   Rosuvastatin Calcium     Other Reaction(s): Other (See Comments)  Severe fatigue   Niacin Other (See Comments)    flushing    Assessment/Plan:  1. Hyperlipidemia -  No problem-specific Assessment & Plan notes found for this encounter.    Thank you,  Olene Floss, Pharm.D, BCACP, CPP [facility] [address], [place], [place] [address]  Phone: [phone]; Fax: [phone]

## 2024-01-03 ENCOUNTER — Ambulatory Visit: Admitting: Family Medicine

## 2024-01-03 ENCOUNTER — Encounter: Payer: Self-pay | Admitting: Family Medicine

## 2024-01-03 VITALS — BP 120/60 | HR 74 | Temp 98.1°F | Ht 70.0 in | Wt 141.5 lb

## 2024-01-03 DIAGNOSIS — K219 Gastro-esophageal reflux disease without esophagitis: Secondary | ICD-10-CM

## 2024-01-03 DIAGNOSIS — R413 Other amnesia: Secondary | ICD-10-CM | POA: Diagnosis not present

## 2024-01-03 DIAGNOSIS — F0390 Unspecified dementia without behavioral disturbance: Secondary | ICD-10-CM | POA: Insufficient documentation

## 2024-01-03 LAB — URINALYSIS, ROUTINE W REFLEX MICROSCOPIC
Bilirubin Urine: NEGATIVE
Hgb urine dipstick: NEGATIVE
Ketones, ur: NEGATIVE
Leukocytes,Ua: NEGATIVE
Nitrite: NEGATIVE
Specific Gravity, Urine: >=1.03 — AB (ref 1.000–1.030)
Total Protein, Urine: NEGATIVE
Urine Glucose: NEGATIVE
Urobilinogen, UA: 0.2 (ref 0.0–1.0)
pH: 5.5 (ref 5.0–8.0)

## 2024-01-03 LAB — VITAMIN B12: Vitamin B-12: 401 pg/mL (ref 211–911)

## 2024-01-03 LAB — T4, FREE: Free T4: 0.71 ng/dL (ref 0.60–1.60)

## 2024-01-03 LAB — TSH: TSH: 4.65 u[IU]/mL (ref 0.35–5.50)

## 2024-01-03 LAB — T3, FREE: T3, Free: 2.8 pg/mL (ref 2.3–4.2)

## 2024-01-03 NOTE — Patient Instructions (Signed)
 It was very nice to see you today!  We will check labs today.  I will refer you to see the neurologist.   Return if symptoms worsen or fail to improve.   Take care, Dr [name]  PLEASE NOTE:  If you had any lab tests, please let us know if you have not heard back within a few days. You may see your results on mychart before we have a chance to review them but we will give you a call once they are reviewed by [place].   If we ordered any referrals today, please let us know if you have not heard from their office within the next week.   If you had any urgent prescriptions sent in today, please check with the pharmacy within an hour of our visit to make sure the prescription was transmitted appropriately.   Please try these tips to maintain a healthy lifestyle:  Eat at least 3 REAL meals and 1-2 snacks per day.  Aim for no more than 5 hours between eating.  If you eat breakfast, please do so within one hour of getting up.   Each meal should contain half fruits/vegetables, one quarter protein, and one quarter carbs (no bigger than a computer mouse)  Cut down on sweet beverages. This includes juice, soda, and sweet tea.   Drink at least 1 glass of water with each meal and aim for at least 8 glasses per day  Exercise at least 150 minutes every week.

## 2024-01-03 NOTE — Assessment & Plan Note (Addendum)
 Abnormal minicog today.  He had an MRI few months ago which did show age-related cerebral atrophy however he has had a precipitous decline within the last few months.  He also had labs from his physical a few months ago.  Do not need to repeat imaging or basic labs at this time however we will check a TSH, free T4, free T3 B12, UA, and urine culture.  We will also refer him to see neurology for further evaluation and management.

## 2024-01-03 NOTE — Progress Notes (Signed)
   Trevor [name] is a 78 y.o. male who presents today for an office visit.  Assessment/Plan:  Chronic Problems Addressed Today: Memory impairment Abnormal minicog today.  He had an MRI few months ago which did show age-related cerebral atrophy however he has had a precipitous decline within the last few months.  He also had labs from his physical a few months ago.  Do not need to repeat imaging or basic labs at this time however we will check a TSH, free T4, free T3 B12, UA, and urine culture.  We will also refer him to see neurology for further evaluation and management.  Gastroesophageal reflux disease without esophagitis Oh Protonix 40 mg daily.  May be contributing to B12 deficiency.  Will recheck a B12 as above today.     Subjective:  HPI:  See Assessment / plan for status of chronic conditions.  Patient is here with his wife.  His main concern today is memory decline. This has been going on for about a year but seems to be getting worse the last few months.  This wife has noticed more short-term memory difficulties.  He will frequently ask repetitive questions and forget what was just told to him.  Has also been more confused recently.  This morning he had difficulty with reading and making sense of words that he was reading.  He does not have any issues with communication and can understand spoken dialogue easily.  He has had some more issues with long-term memory as well.  No obvious precipitating events.  He did have a respiratory illness several weeks ago which has since resolved.  He had a TIA several months ago that was diagnosed and managed via ophthalmology.  Had an MRI at that time which showed age-related cerebral atrophy however no acute ischemic changes.  Overall he has tried to be an active.  Has not been able to run as much as he would like though does try to stay active.  No family history of dementia.  Mood has been stable.      Objective:  Physical Exam: BP 120/60  (BP Location: Left Arm, Patient Position: Sitting, Cuff Size: Normal)   Pulse 74   Temp 98.1 F (36.7 C) (Temporal)   Ht 5\' 10"  (1.778 m)   Wt 141 lb 8 oz (64.2 kg)   SpO2 99%   BMI 20.30 kg/m   Gen: No acute distress, resting comfortably CV: Regular rate and rhythm with no murmurs appreciated Pulm: Normal work of breathing, clear to auscultation bilaterally with no crackles, wheezes, or rhonchi  Neuro: Grossly normal, moves all extremities.  Cranial nerves II through XII intact. - Minicog: Abnormal clock face.  1 out of 3 delayed word recall. Psych: Normal affect and thought content      Samariya Rockhold M. [name], MD 01/03/2024 2:39 PM

## 2024-01-03 NOTE — Assessment & Plan Note (Signed)
 Oh Protonix 40 mg daily.  May be contributing to B12 deficiency.  Will recheck a B12 as above today.

## 2024-01-04 LAB — URINE CULTURE
MICRO NUMBER:: 16173666
Result:: NO GROWTH
SPECIMEN QUALITY:: ADEQUATE

## 2024-01-06 ENCOUNTER — Encounter: Payer: Self-pay | Admitting: Family Medicine

## 2024-01-06 ENCOUNTER — Ambulatory Visit (INDEPENDENT_AMBULATORY_CARE_PROVIDER_SITE_OTHER): Payer: Medicare Other

## 2024-01-06 VITALS — Ht 67.0 in | Wt 141.0 lb

## 2024-01-06 DIAGNOSIS — Z0000 Encounter for general adult medical examination without abnormal findings: Secondary | ICD-10-CM

## 2024-01-06 NOTE — Progress Notes (Signed)
 His labs are all normal.  No signs of UTI.  His thyroid and B12 levels are at goal.  Recommend he follow-up soon with neurology as we discussed at his office visit.

## 2024-01-06 NOTE — Patient Instructions (Signed)
 Trevor [name] , Thank you for taking time to come for your Medicare Wellness Visit. I appreciate your ongoing commitment to your health goals. Please review the following plan we discussed and let me know if I can assist you in the future.   Referrals/Orders/Follow-Ups/Clinician Recommendations: maintain health and activity   This is a list of the screening recommended for you and due dates:  Health Maintenance  Topic Date Due   Medicare Annual Wellness Visit  12/26/2023   Flu Shot  01/27/2024*   COVID-19 Vaccine (3 - Moderna risk series) 01/02/2025*   Cologuard (Stool DNA test)  10/29/2026   Pneumonia Vaccine  Completed   Hepatitis C Screening  Completed   Zoster (Shingles) Vaccine  Completed   HPV Vaccine  Aged Out   DTaP/Tdap/Td vaccine  Discontinued   Colon Cancer Screening  Discontinued  *Topic was postponed. The date shown is not the original due date.    Advanced directives: (Copy Requested) Please bring a copy of your health care power of attorney and living will to the office to be added to your chart at your convenience. You can mail to [facility] [address] [place], [place] [address] or email to [email or web address]  Next Medicare Annual Wellness Visit scheduled for next year: Yes

## 2024-01-06 NOTE — Progress Notes (Signed)
 Subjective:   Trevor [name] is a 78 y.o. who presents for a Medicare Wellness preventive visit.  Visit Complete: Virtual I connected with  Trevor [name] on 01/06/24 by a audio enabled telemedicine application and verified that I am speaking with the correct person using two identifiers.  Patient Location: Home  Provider Location: Office/Clinic  I discussed the limitations of evaluation and management by telemedicine. The patient expressed understanding and agreed to proceed.  Vital Signs: Because this visit was a virtual/telehealth visit, some criteria may be missing or patient reported. Any vitals not documented were not able to be obtained and vitals that have been documented are patient reported.  VideoDeclined- This patient declined Librarian, academic. Therefore the visit was completed with audio only.  AWV Questionnaire: No: Patient Medicare AWV questionnaire was not completed prior to this visit.  Cardiac Risk Factors include: advanced age (>18men, >32 women);dyslipidemia;male gender     Objective:    Today's Vitals   01/06/24 1023  Weight: 141 lb (64 kg)  Height: 5\' 7"  (1.702 m)   Body mass index is 22.08 kg/m.     01/06/2024   10:26 AM 12/25/2022   10:08 AM 12/21/2021   11:01 AM 12/12/2020    1:16 PM 09/28/2019    4:16 PM 09/22/2018   11:27 AM 09/11/2017    8:51 AM  Advanced Directives  Does Patient Have a Medical Advance Directive? Yes Yes Yes Yes Yes Yes Yes  Type of Estate agent of [place];Living will Healthcare Power of [place];Living will Healthcare Power of [employer] Power of Attorney Living will;Healthcare Power of Attorney    Does patient want to make changes to medical advance directive?     No - Patient declined    Copy of Healthcare Power of Attorney in Chart? No - copy requested No - copy requested No - copy requested No - copy requested No - copy requested      Current  Medications (verified) Outpatient Encounter Medications as of 01/06/2024  Medication Sig   Ascorbic Acid (VITA-C PO) Take 1 tablet by mouth daily.   aspirin 81 MG tablet Take 81 mg by mouth daily.     Cholecalciferol (VITAMIN D) 2000 UNITS CAPS Take 2,000 Units by mouth daily.    fish oil-omega-3 fatty acids 1000 MG capsule Take 1 g by mouth daily.    Magnesium 250 MG TABS Take 250 mg by mouth daily.    metroNIDAZOLE (METROCREAM) 0.75 % cream Apply 1 application topically as needed.   Multiple Vitamins-Minerals (MENS 50+ MULTI VITAMIN/MIN PO) Take 1 tablet daily by mouth.   pantoprazole (PROTONIX) 40 MG tablet TAKE 1 TABLET(40 MG) BY MOUTH DAILY   Turmeric (QC TUMERIC COMPLEX PO) Take by mouth.   Zinc 25 MG TABS Take 1 tablet by mouth daily.   [DISCONTINUED] NON FORMULARY    No facility-administered encounter medications on file as of 01/06/2024.    Allergies (verified) Codeine, Famotidine, Rosuvastatin calcium, and Niacin   History: Past Medical History:  Diagnosis Date   Acute pericarditis    a. admitted as a code STEMI but no sig CAD on cath.    CAD (coronary artery disease)    30% stenosis mid RCA   Colonic polyp    Dr. Kinnie Scales; hyperplastic polyp   [name]    Hyperlipidemia    Past Surgical History:  Procedure Laterality Date   actinic keratoses     facial removed x 2, Dr Danella Deis  basal cell cancer  2013 & 2015   Dr Campbell Stall   CARDIAC CATHETERIZATION N/A 10/30/2015   Procedure: Left Heart Cath and Coronary Angiography;  Surgeon: Kathleene Hazel, MD;  Location: [facility] INVASIVE CV LAB;  Service: Cardiovascular;  Laterality: N/A;   COLONOSCOPY  2014   negative   COLONOSCOPY W/ POLYPECTOMY  2004   @ age 69, Dr Kinnie Scales. ? Hyperplastic polyp   FLEXIBLE SIGMOIDOSCOPY  1999   negative   KNEE ARTHROSCOPY  1984   right   Family History  Problem Relation Age of Onset   Stroke Father 44   Alcohol abuse Father    Asthma Brother    Breast cancer Mother    HIV  Brother    Prostate cancer Brother 60   Breast cancer Maternal Aunt    Breast cancer Maternal Grandmother    Heart disease Neg Hx    Diabetes Neg Hx    Social History   Socioeconomic History   Marital status: Married    Spouse name: Not on file   Number of children: Not on file   Years of education: Not on file   Highest education level: Not on file  Occupational History   Occupation: retired   Tobacco Use   Smoking status: Former    Current packs/day: 0.00    Average packs/day: 1 pack/day for 13.0 years (13.0 ttl pk-yrs)    Types: Cigarettes    Start date: 10/29/1965    Quit date: 10/29/1978    Years since quitting: 45.2   Smokeless tobacco: Never   Tobacco comments:    smoked 1967-1980, up to 1 ppd  Vaping Use   Vaping status: Never Used  Substance and Sexual Activity   Alcohol use: No    Alcohol/week: 0.0 standard drinks of alcohol   Drug use: No   Sexual activity: Not on file  Other Topics Concern   Not on file  Social History Narrative   Originally from [place]. Moved to [place] in 1951. He moved to [place] in 1984. 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 Previously has traveled to [place], [place], [place], [place], [place], and [place]. Normally has a cockatiel but not currently No mold exposure. Has a hot tub but doesn't use it regularly. No children    Social Drivers of Corporate investment banker Strain: Low Risk  (01/06/2024)   Overall Financial Resource Strain (CARDIA)    Difficulty of Paying Living Expenses: Not hard at all  Food Insecurity: No Food Insecurity (01/06/2024)   Hunger Vital Sign    Worried About Running Out of Food in the Last Year: Never true    Ran Out of Food in the Last Year: Never true  Transportation Needs: No Transportation Needs (01/06/2024)   PRAPARE - Administrator, Civil Service (Medical): No    Lack of Transportation (Non-Medical): No  Physical Activity: Sufficiently  Active (01/06/2024)   Exercise Vital Sign    Days of Exercise per Week: 5 days    Minutes of Exercise per Session: 90 min  Stress: No Stress Concern Present (01/06/2024)   [employer] of Occupational Health - Occupational Stress Questionnaire    Feeling of Stress : Not at all  Social Connections: Socially Integrated (01/06/2024)   Social Connection and Isolation Panel [NHANES]    Frequency of Communication with Friends and Family: More than three times a week    Frequency of Social Gatherings with Friends and  Family: More than three times a week    Attends Religious Services: More than 4 times per year    Active Member of Clubs or Organizations: Yes    Attends Banker Meetings: 1 to 4 times per year    Marital Status: Married    Tobacco Counseling Counseling given: Not Answered Tobacco comments: smoked 1967-1980, up to 1 ppd    Clinical Intake:  Pre-visit preparation completed: Yes  Pain : No/denies pain     BMI - recorded: 22.08 Nutritional Status: BMI of 19-24  Normal Nutritional Risks: None Diabetes: No  How often do you need to have someone help you when you read instructions, pamphlets, or other written materials from your doctor or pharmacy?: 1 - Never  Interpreter Needed?: No  Information entered by :: Lanier Ensign, LPN   Activities of Daily Living     01/06/2024   10:25 AM  In your present state of health, do you have any difficulty performing the following activities:  Hearing? 0  Vision? 0  Difficulty concentrating or making decisions? 0  Walking or climbing stairs? 0  Dressing or bathing? 0  Doing errands, shopping? 0  Preparing Food and eating ? N  Using the Toilet? N  In the past six months, have you accidently leaked urine? N  Do you have problems with loss of bowel control? N  Managing your Medications? N  Managing your Finances? N  Housekeeping or managing your Housekeeping? N    Patient Care Team: Ardith Dark, MD as  PCP - General (Family Medicine) Kathleene Hazel, MD as PCP - Cardiology (Cardiology) Estill Bamberg, MD as Consulting Physician (Orthopedic Surgery)  Indicate any recent Medical Services you may have received from other than [facility] providers in the past year (date may be approximate).     Assessment:   This is a routine wellness examination for [place].  Hearing/Vision screen Hearing Screening - Comments:: Pt denies any hearing issues  Vision Screening - Comments:: Pt follows up with Dr Cathey Endow for annual eye exams    Goals Addressed             This Visit's Progress    Patient Stated       Maintain health and activity        Depression Screen     01/06/2024   10:30 AM 10/07/2023    9:39 AM 10/07/2023    9:31 AM 12/25/2022   10:07 AM 10/05/2022   10:49 AM 12/21/2021   11:00 AM 12/12/2020    1:14 PM  PHQ 2/9 Scores  PHQ - 2 Score 0 0 0 0 0 0 0    Fall Risk     01/06/2024   10:32 AM 10/07/2023    9:39 AM 10/07/2023    9:31 AM 12/25/2022   10:08 AM 10/05/2022   10:49 AM  Fall Risk   Falls in the past year? 0 0 0 0 0  Number falls in past yr: 0 0 0 0 0  Injury with Fall? 0 0 0 0 0  Risk for fall due to : No Fall Risks No Fall Risks No Fall Risks Impaired vision No Fall Risks  Follow up Falls prevention discussed   Falls prevention discussed     MEDICARE RISK AT HOME:  Medicare Risk at Home Any stairs in or around the home?: Yes If so, are there any without handrails?: No Home free of loose throw rugs in walkways, pet beds, electrical cords, etc?: Yes  Adequate lighting in your home to reduce risk of falls?: Yes Life alert?: No Use of a cane, walker or w/c?: No Grab bars in the bathroom?: No Shower chair or bench in shower?: No Elevated toilet seat or a handicapped toilet?: No  TIMED UP AND GO:  Was the test performed?  No  Cognitive Function: 6CIT completed    09/22/2018   11:31 AM 09/11/2017    8:58 AM  MMSE - Mini Mental State Exam  Not completed:  -- --        01/06/2024   10:37 AM  6CIT Screen  What Year? 4 points  What month? 0 points  What time? 0 points  Count back from 20 0 points  Months in reverse 4 points  Repeat phrase 10 points  Total Score 18 points    Immunizations Immunization History  Administered Date(s) Administered   Moderna Sars-Covid-2 Vaccination 09/08/2020, 10/06/2020   Pneumococcal Conjugate-13 10/21/2015   Pneumococcal Polysaccharide-23 06/13/2017   Td 09/09/2008   Zoster Recombinant(Shingrix) 09/21/2019, 11/24/2019   Zoster, Live 09/17/2014    Screening Tests Health Maintenance  Topic Date Due   INFLUENZA VACCINE  01/27/2024 (Originally 05/30/2023)   COVID-19 Vaccine (3 - Moderna risk series) 01/02/2025 (Originally 11/03/2020)   Medicare Annual Wellness (AWV)  01/05/2025   Fecal DNA (Cologuard)  10/29/2026   Pneumonia Vaccine 35+ Years old  Completed   Hepatitis C Screening  Completed   Zoster Vaccines- Shingrix  Completed   HPV VACCINES  Aged Out   DTaP/Tdap/Td  Discontinued   Colonoscopy  Discontinued    Health Maintenance  There are no preventive care reminders to display for this patient.  Health Maintenance Items Addressed: See Nurse Notes  Additional Screening:  Vision Screening: Recommended annual ophthalmology exams for early detection of glaucoma and other disorders of the eye.  Dental Screening: Recommended annual dental exams for proper oral hygiene  Community Resource Referral / Chronic Care Management: CRR required this visit?  No   CCM required this visit?  No     Plan:     I have personally reviewed and noted the following in the patient's chart:   Medical and social history Use of alcohol, tobacco or illicit drugs  Current medications and supplements including opioid prescriptions. Patient is not currently taking opioid prescriptions. Functional ability and status Nutritional status Physical activity Advanced directives List of other  physicians Hospitalizations, surgeries, and ER visits in previous 12 months Vitals Screenings to include cognitive, depression, and falls Referrals and appointments  In addition, I have reviewed and discussed with patient certain preventive protocols, quality metrics, and best practice recommendations. A written personalized care plan for preventive services as well as general preventive health recommendations were provided to patient.     [name], LPN   0/27/2536   After Visit Summary: (MyChart) Due to this being a telephonic visit, the after visit summary with patients personalized plan was offered to patient via MyChart   Notes: please refer to routing notes

## 2024-01-08 ENCOUNTER — Ambulatory Visit: Admitting: Physician Assistant

## 2024-01-08 ENCOUNTER — Encounter: Payer: Self-pay | Admitting: Physician Assistant

## 2024-01-08 ENCOUNTER — Ambulatory Visit

## 2024-01-08 VITALS — BP 121/75 | HR 60 | Resp 18 | Ht 70.0 in | Wt 141.0 lb

## 2024-01-08 DIAGNOSIS — R413 Other amnesia: Secondary | ICD-10-CM

## 2024-01-08 MED ORDER — MEMANTINE HCL 10 MG PO TABS: ORAL_TABLET | ORAL | 11 refills | Status: DC

## 2024-01-08 NOTE — Progress Notes (Cosign Needed)
 Assessment/Plan:     Trevor [name] is a very pleasant 78 y.o. year old RH male with a history of GERD, possible "eye TIA with negative MRI" in October 2024, hyperlipidemia, BPH, Gilbert syndrome, CAD, prior history of acute pericarditis, vitamin D deficiency, COPD seen today for evaluation of memory loss. MoCA today is 3 /30.  He has significant difficulty with most on this test.  Although he is able to participate on his ADLs and continues to drive, there is high suspicion for dementia due to mixed Alzheimer's and vascular disease.  Discussed with the patient initiating antidementia medication such as Namenda, with goals in the near future of adding donepezil for broader coverage.  He agrees to proceed.  Dementia likely due to mixed Alzheimer's and vascular etiology  MRI brain without contrast to assess for underlying structural abnormality and assess vascular load  Start memantine 10 mg twice daily as directed, side effects discussed Replenish B12 Recommend good control of cardiovascular risk factors.   Continue to control mood as per PCP [place] up in 3 months  Subjective:    The patient is accompanied by his wife who supplements the history.    How long did patient have memory difficulties?  For the last 3-4 years after a "very bad Covid", but worse over the last year, "better and worse days "-wife says. Patient has difficulty remembering new information, recent conversations and names of people. LTM is affected as well.  repeats oneself?  Endorsed Disoriented when walking into a room?  Denies except occasionally not remembering what patient came to the room for Leaving objects in unusual places?  He may lose the glasses in different places, "but not in the trash".  Wandering behavior? Denies.   Any personality changes, or depression, anxiety?  Has moments of irritability.  Hallucinations or paranoia?  Denies  Seizures? Denies.    Any sleep changes?   For the last 3 nights  he has been screaming "get out of here ". Wife reports REM behavior moving his hands as if he were reenacting the dream.  Denies sleepwalking.  Wife was concerned "being a [place], the devil is coming to visit as with having to go away " Sleep apnea? Denies.   Any hygiene concerns?  Denies.   Independent of bathing and dressing?  Yes Who is in charge of the medications? Patient is in charge   Who is in charge of the finances? Both in charge     Any changes in appetite?   Denies.     Patient have trouble swallowing?  Denies.   Does the patient cook?  No  Any headaches?  Denies.   Chronic back pain?  Denies.   Ambulates with difficulty? Needs a walker to ambulate     Recent falls or head injuries? Denies.     Vision changes? Denies. Stroke like symptoms?  He is asymptomatic for stroke, he was told that on his right eye, during an eye exam he may have a stroke, but an MRI of the brain did not yield any other abnormalities. Any tremors?  Denies.   Any anosmia?  Denies.   Any incontinence of urine? Denies.   Any bowel dysfunction? Denies.      Patient lives  his wife  History of heavy alcohol intake? Denies.   History of heavy tobacco use? Denies.   Family history of dementia?  Denies  Does patient drive? Denies any issues, wife reports that he is excellent driver.  Allergies  Allergen Reactions   Codeine Anaphylaxis    Mental status changes  Other Reaction(s): Psychosis   Famotidine Shortness Of Breath   Rosuvastatin Calcium     Other Reaction(s): Other (See Comments)  Severe fatigue   Niacin Other (See Comments)    flushing    Current Outpatient Medications  Medication Instructions   Ascorbic Acid (VITA-C PO) 1 tablet, Daily   aspirin 81 mg, Daily   fish oil-omega-3 fatty acids 1 g, Daily   Magnesium 250 mg, Daily   metroNIDAZOLE (METROCREAM) 0.75 % cream 1 application , As needed   Multiple Vitamins-Minerals (MENS 50+ MULTI VITAMIN/MIN PO) 1 tablet, Daily    pantoprazole (PROTONIX) 40 MG tablet TAKE 1 TABLET(40 MG) BY MOUTH DAILY   Turmeric (QC TUMERIC COMPLEX PO) Take by mouth.   Vitamin D 2,000 Units, Daily   Zinc 25 MG TABS 1 tablet, Daily     VITALS:   Vitals:   01/08/24 1258  BP: 121/75  Pulse: 60  Resp: 18  SpO2: 96%  Weight: 141 lb (64 kg)  Height: 5\' 10"  (1.778 m)      PHYSICAL EXAM   HEENT:  Normocephalic, atraumatic. The mucous membranes are moist. The superficial temporal arteries are without ropiness or tenderness. Cardiovascular: Regular rate and rhythm. Lungs: Clear to auscultation bilaterally. Neck: There are no carotid bruits noted bilaterally.  NEUROLOGICAL:    01/08/2024    1:00 PM  Montreal Cognitive Assessment   Visuospatial/ Executive (0/5) 0  Naming (0/3) 0  Attention: Read list of digits (0/2) 0  Attention: Read list of letters (0/1) 1  Attention: Serial 7 subtraction starting at 100 (0/3) 0  Language: Repeat phrase (0/2) 1  Language : Fluency (0/1) 0  Abstraction (0/2) 0  Delayed Recall (0/5) 0  Orientation (0/6) 1  Total 3  Adjusted Score (based on education) 3       09/22/2018   11:31 AM 09/11/2017    8:58 AM  MMSE - Mini Mental State Exam  Not completed: -- --     Orientation:  Alert and oriented to person, not to place and time. No aphasia or dysarthria. Fund of knowledge is reduced. Recent and remote memory impaired.  Attention and concentration are reduced.  Able to name objects and unable to repeat phrases. Delayed recall 0/5 Cranial nerves: There is good facial symmetry. Extraocular muscles are intact and visual fields are full to confrontational testing. Speech is fluent and clear, no tongue deviation. Hearing is intact to conversational tone. Tone: Tone is good throughout. Sensation: Sensation is intact to light touch and pinprick throughout. Vibration is intact at the bilateral big toe. Coordination: The patient has no difficulty with RAM's or FNF bilaterally. Normal finger to nose   Motor: Strength is 5/5 in the bilateral upper and lower extremities. There is no pronator drift. There are no fasciculations noted. DTR's: Deep tendon reflexes are 2/4 .  Plantar responses are downgoing bilaterally. Gait and Station: The patient is able to ambulate with difficulty. Needs a walker to ambulate. Gait is cautious and narrow.      Thank you for allowing [place] the opportunity to participate in the care of this nice patient. Please do not hesitate to contact us for any questions or concerns.   Total time spent on today's visit was 60 minutes dedicated to this patient today, preparing to see patient, examining the patient, ordering tests and/or medications and counseling the patient, documenting clinical information in the EHR or other health record, independently  interpreting results and communicating results to the patient/family, discussing treatment and goals, answering patient's questions and coordinating care.  Cc:  Ardith Dark, MD  Marlowe Kays 01/08/2024 1:44 PM

## 2024-01-08 NOTE — Patient Instructions (Addendum)
 It was a pleasure to see you today at our office.   Recommendations:  MRI of the brain, the radiology office will call you to arrange you appointment [phone] Replenish the B12  Start Memantine 10 mg: Take 1 tablet (10 mg at night) for 2 weeks, then increase to 1 tablet (10 mg) twice a day.    Follow up in  months Recommend visiting the website : " Dementia Success Path" to better understand some behaviors related to memory loss.    For psychiatric meds, mood meds: Please have your primary care physician manage these medications.  If you have any severe symptoms of a stroke, or other severe issues such as confusion,severe chills or fever, etc call 911 or go to the ER as you may need to be evaluated further   For guidance regarding [facility] and if placement were needed at the facility, contact Social Worker tel: [phone]  For assessment of decision of mental capacity and competency:  Call Dr. Erick Blinks, geriatric psychiatrist at [phone]  Counseling regarding caregiver distress, including caregiver depression, anxiety and issues regarding community resources, adult day care programs, adult living facilities, or memory care questions:  please contact your  Primary Doctor's Social Worker   Whom to call: Memory  decline, memory medications: Call our office [phone]    [email or web address]   RECOMMENDATIONS FOR ALL PATIENTS WITH MEMORY PROBLEMS: 1. Continue to exercise (Recommend 30 minutes of walking everyday, or 3 hours every week) 2. Increase social interactions - continue going to [place] and enjoy social gatherings with friends and family 3. Eat healthy, avoid fried foods and eat more fruits and vegetables 4. Maintain adequate blood pressure, blood sugar, and blood cholesterol level. Reducing the risk of stroke and cardiovascular disease also helps promoting better memory. 5. Avoid stressful  situations. Live a simple life and avoid aggravations. Organize your time and prepare for the next day in anticipation. 6. Sleep well, avoid any interruptions of sleep and avoid any distractions in the bedroom that may interfere with adequate sleep quality 7. Avoid sugar, avoid sweets as there is a strong link between excessive sugar intake, diabetes, and cognitive impairment We discussed the Mediterranean diet, which has been shown to help patients reduce the risk of progressive memory disorders and reduces cardiovascular risk. This includes eating fish, eat fruits and green leafy vegetables, nuts like almonds and hazelnuts, walnuts, and also use olive oil. Avoid fast foods and fried foods as much as possible. Avoid sweets and sugar as sugar use has been linked to worsening of memory function.  There is always a concern of gradual progression of memory problems. If this is the case, then we may need to adjust level of care according to patient needs. Support, both to the patient and caregiver, should then be put into place.        DRIVING: Regarding driving, in patients with progressive memory problems, driving will be impaired. We advise to have someone else do the driving if trouble finding directions or if minor accidents are reported. Independent driving assessment is available to determine safety of driving.   If you are interested in the driving assessment, you can contact the following:  [facility] [employer] in [place] [phone]  [facility] [phone]  [facility] [phone]  [facility] [phone] or [phone]   FALL PRECAUTIONS: Be cautious when walking. Scan the area for obstacles that may increase the risk of trips and falls. When getting up in the mornings,  sit up at the edge of the bed for a few minutes before getting out of bed. Consider elevating the bed at the head end to avoid drop of blood pressure when getting up. Walk  always in a well-lit room (use night lights in the walls). Avoid area rugs or power cords from appliances in the middle of the walkways. Use a walker or a cane if necessary and consider physical therapy for balance exercise. Get your eyesight checked regularly.  FINANCIAL OVERSIGHT: Supervision, especially oversight when making financial decisions or transactions is also recommended.  HOME SAFETY: Consider the safety of the kitchen when operating appliances like stoves, microwave oven, and blender. Consider having supervision and share cooking responsibilities until no longer able to participate in those. Accidents with firearms and other hazards in the house should be identified and addressed as well.   ABILITY TO BE LEFT ALONE: If patient is unable to contact 911 operator, consider using LifeLine, or when the need is there, arrange for someone to stay with patients. Smoking is a fire hazard, consider supervision or cessation. Risk of wandering should be assessed by caregiver and if detected at any point, supervision and safe proof recommendations should be instituted.  MEDICATION SUPERVISION: Inability to self-administer medication needs to be constantly addressed. Implement a mechanism to ensure safe administration of the medications.      Mediterranean Diet A Mediterranean diet refers to food and lifestyle choices that are based on the traditions of countries located on the [employer]. This way of eating has been shown to help prevent certain conditions and improve outcomes for people who have chronic diseases, like kidney disease and heart disease. What are tips for following this plan? Lifestyle  Cook and eat meals together with your family, when possible. Drink enough fluid to keep your urine clear or pale yellow. Be physically active every day. This includes: Aerobic exercise like running or swimming. Leisure activities like gardening, walking, or housework. Get 7-8 hours of  sleep each night. If recommended by your health care provider, drink red wine in moderation. This means 1 glass a day for nonpregnant women and 2 glasses a day for men. A glass of wine equals 5 oz (150 mL). Reading food labels  Check the serving size of packaged foods. For foods such as rice and pasta, the serving size refers to the amount of cooked product, not dry. Check the total fat in packaged foods. Avoid foods that have saturated fat or trans fats. Check the ingredients list for added sugars, such as corn syrup. Shopping  At the grocery store, buy most of your food from the areas near the walls of the store. This includes: Fresh fruits and vegetables (produce). Grains, beans, nuts, and seeds. Some of these may be available in unpackaged forms or large amounts (in bulk). Fresh seafood. Poultry and eggs. Low-fat dairy products. Buy whole ingredients instead of prepackaged foods. Buy fresh fruits and vegetables in-season from local farmers markets. Buy frozen fruits and vegetables in resealable bags. If you do not have access to quality fresh seafood, buy precooked frozen shrimp or canned fish, such as tuna, salmon, or sardines. Buy small amounts of raw or cooked vegetables, salads, or olives from the deli or salad bar at your store. Stock your pantry so you always have certain foods on hand, such as olive oil, canned tuna, canned tomatoes, rice, pasta, and beans. Cooking  Cook foods with extra-virgin olive oil instead of using butter or other vegetable oils. Have meat  as a side dish, and have vegetables or grains as your main dish. This means having meat in small portions or adding small amounts of meat to foods like pasta or stew. Use beans or vegetables instead of meat in common dishes like chili or lasagna. Experiment with different cooking methods. Try roasting or broiling vegetables instead of steaming or sauteing them. Add frozen vegetables to soups, stews, pasta, or rice. Add  nuts or seeds for added healthy fat at each meal. You can add these to yogurt, salads, or vegetable dishes. Marinate fish or vegetables using olive oil, lemon juice, garlic, and fresh herbs. Meal planning  Plan to eat 1 vegetarian meal one day each week. Try to work up to 2 vegetarian meals, if possible. Eat seafood 2 or more times a week. Have healthy snacks readily available, such as: Vegetable sticks with hummus. Greek yogurt. Fruit and nut trail mix. Eat balanced meals throughout the week. This includes: Fruit: 2-3 servings a day Vegetables: 4-5 servings a day Low-fat dairy: 2 servings a day Fish, poultry, or lean meat: 1 serving a day Beans and legumes: 2 or more servings a week Nuts and seeds: 1-2 servings a day Whole grains: 6-8 servings a day Extra-virgin olive oil: 3-4 servings a day Limit red meat and sweets to only a few servings a month What are my food choices? Mediterranean diet Recommended Grains: Whole-grain pasta. Brown rice. Bulgar wheat. Polenta. Couscous. Whole-wheat bread. Orpah Cobb. Vegetables: Artichokes. Beets. Broccoli. Cabbage. Carrots. Eggplant. Green beans. Chard. Kale. Spinach. Onions. Leeks. Peas. Squash. Tomatoes. Peppers. Radishes. Fruits: Apples. Apricots. Avocado. Berries. Bananas. Cherries. Dates. Figs. Grapes. Lemons. Melon. Oranges. Peaches. Plums. Pomegranate. Meats and other protein foods: Beans. Almonds. Sunflower seeds. Pine nuts. Peanuts. Cod. Salmon. Scallops. Shrimp. Tuna. Tilapia. Clams. Oysters. Eggs. Dairy: Low-fat milk. Cheese. Greek yogurt. Beverages: Water. Red wine. Herbal tea. Fats and oils: Extra virgin olive oil. Avocado oil. Grape seed oil. Sweets and desserts: [place] yogurt with honey. Baked apples. Poached pears. Trail mix. Seasoning and other foods: Basil. Cilantro. Coriander. Cumin. Mint. Parsley. Sage. Rosemary. Tarragon. Garlic. Oregano. Thyme. Pepper. Balsalmic vinegar. Tahini. Hummus. Tomato sauce. Olives.  Mushrooms. Limit these Grains: Prepackaged pasta or rice dishes. Prepackaged cereal with added sugar. Vegetables: Deep fried potatoes (french fries). Fruits: Fruit canned in syrup. Meats and other protein foods: Beef. Pork. Lamb. Poultry with skin. Hot dogs. Tomasa Blase. Dairy: Ice cream. Sour cream. Whole milk. Beverages: Juice. Sugar-sweetened soft drinks. Beer. Liquor and spirits. Fats and oils: Butter. Canola oil. Vegetable oil. Beef fat (tallow). Lard. Sweets and desserts: Cookies. Cakes. Pies. Candy. Seasoning and other foods: Mayonnaise. Premade sauces and marinades. The items listed may not be a complete list. Talk with your dietitian about what dietary choices are right for you. Summary The Mediterranean diet includes both food and lifestyle choices. Eat a variety of fresh fruits and vegetables, beans, nuts, seeds, and whole grains. Limit the amount of red meat and sweets that you eat. Talk with your health care provider about whether it is safe for you to drink red wine in moderation. This means 1 glass a day for nonpregnant women and 2 glasses a day for men. A glass of wine equals 5 oz (150 mL). This information is not intended to replace advice given to you by your health care provider. Make sure you discuss any questions you have with your health care provider. Document Released: 06/07/2016 Document Revised: 07/10/2016 Document Reviewed: 06/07/2016 Elsevier Interactive Patient Education  2017 [employer].

## 2024-02-20 ENCOUNTER — Ambulatory Visit (INDEPENDENT_AMBULATORY_CARE_PROVIDER_SITE_OTHER): Admitting: Family Medicine

## 2024-02-20 ENCOUNTER — Ambulatory Visit

## 2024-02-20 ENCOUNTER — Encounter: Payer: Self-pay | Admitting: Family Medicine

## 2024-02-20 VITALS — BP 101/61 | HR 73 | Temp 98.2°F | Ht 70.0 in | Wt 143.0 lb

## 2024-02-20 DIAGNOSIS — J440 Chronic obstructive pulmonary disease with acute lower respiratory infection: Secondary | ICD-10-CM | POA: Diagnosis not present

## 2024-02-20 DIAGNOSIS — G309 Alzheimer's disease, unspecified: Secondary | ICD-10-CM | POA: Diagnosis not present

## 2024-02-20 DIAGNOSIS — G4730 Sleep apnea, unspecified: Secondary | ICD-10-CM | POA: Insufficient documentation

## 2024-02-20 DIAGNOSIS — R059 Cough, unspecified: Secondary | ICD-10-CM | POA: Diagnosis not present

## 2024-02-20 DIAGNOSIS — J45990 Exercise induced bronchospasm: Secondary | ICD-10-CM

## 2024-02-20 DIAGNOSIS — F0280 Dementia in other diseases classified elsewhere without behavioral disturbance: Secondary | ICD-10-CM

## 2024-02-20 DIAGNOSIS — J209 Acute bronchitis, unspecified: Secondary | ICD-10-CM

## 2024-02-20 MED ORDER — AIRSUPRA 90-80 MCG/ACT IN AERO: 1.0000 | INHALATION_SPRAY | RESPIRATORY_TRACT | 5 refills | Status: DC | PRN

## 2024-02-20 NOTE — Assessment & Plan Note (Signed)
 Overall reassuring exam though is having more frequent cough recently.  Will start Airsupra .  He has done well with inhalers previously.  Check x-ray today.  They will follow-up with us  in a few weeks via MyChart.  Come back for next office visit in 3 to 6 months.  If not improving with this would consider controller inhaler versus referral to pulmonology.

## 2024-02-20 NOTE — Assessment & Plan Note (Signed)
 Patient with witnessed coughing and apneic episodes at night.  We will refer for sleep study.

## 2024-02-20 NOTE — Progress Notes (Signed)
   Trevor [name] is a 78 y.o. male who presents today for an office visit.  Assessment/Plan:  New/Acute Problems: Cough  No red flags.  Reassuring exam.  Likely due to flareup of his asthma/COPD due to recent pollen outbreak.  Given length of symptoms will check x-ray today.  Also start Airsupra  to use as needed.  If not improving may consider referral to pulmonology.  Chronic Problems Addressed Today: Dementia (HCC) Had lengthy discussion with patient today regarding recent diagnosis of dementia.  Thought to be likely combination Alzheimer's and vascular dementia per neurology.  He is on memantine .  Did not tolerate 10 mg twice daily but seems to be doing okay with 10 mg daily.  He will follow-up with neurology again in a few weeks.    Exercise-induced asthma Overall reassuring exam though is having more frequent cough recently.  Will start Airsupra .  He has done well with inhalers previously.  Check x-ray today.  They will follow-up with us  in a few weeks via MyChart.  Come back for next office visit in 3 to 6 months.  If not improving with this would consider controller inhaler versus referral to pulmonology.  Sleep-disordered breathing Patient with witnessed coughing and apneic episodes at night.  We will refer for sleep study.     Subjective:  HPI:  See A/P for status of chronic conditions.  Patient is here today for for follow-up.  I saw him 6 weeks ago for concern for memory impairment.  At that time we referred him to neurology.  His workup with us  was overall negative including imaging and labs.  He saw neurology 5 days after our visit.  Diagnosed with dementia likely secondary to Alzheimer's and vascular etiology.  He was started on memantine .  He did well with this initially but did not do well once he increased to 10 mg twice daily.  And they subsequently decreased back to 10 mg once daily.  He has done well with this dose.  He is also started B12 supplement.  He is doing  well with this.  Wife is concerned about his respiratory status.  He is not having shortness of breath does have more frequent cough the last couple of months.  Usually can be worsened with exercise.  They also believe the pollen may be playing a role.  They are hesitant to try over-the-counter medications due to concern that it may exacerbate his cognitive decline.  He has been on inhalers in the past and has done well with these.  She also notes that he will occasionally stop breathing at night and have coughing fits.  They are interested in pursuing sleep study.      Objective:  Physical Exam: BP 101/61   Pulse 73   Temp 98.2 F (36.8 C) (Temporal)   Ht 5\' 10"  (1.778 m)   Wt 143 lb (64.9 kg)   SpO2 98%   BMI 20.52 kg/m   Gen: No acute distress, resting comfortably CV: Regular rate and rhythm with no murmurs appreciated Pulm: Normal work of breathing, clear to auscultation bilaterally with no crackles, wheezes, or rhonchi Neuro: Grossly normal, moves all extremities Psych: Normal affect and thought content      Jahyra Sukup M. Daneil Dunker, MD 02/20/2024 11:02 AM

## 2024-02-20 NOTE — Assessment & Plan Note (Signed)
 Had lengthy discussion with patient today regarding recent diagnosis of dementia.  Thought to be likely combination Alzheimer's and vascular dementia per neurology.  He is on memantine .  Did not tolerate 10 mg twice daily but seems to be doing okay with 10 mg daily.  He will follow-up with neurology again in a few weeks.

## 2024-02-20 NOTE — Patient Instructions (Signed)
 It was very nice to see you today!  We will check an x-ray today.  Use the inhaler as needed for the cough.  I hope your symptoms improve as the pollen clears out.  I will refer you for a sleep study as well.  I will see you back in 3 to 6 months.  Please come back sooner if needed.  Return in about 3 months (around 05/21/2024).   Take care, Dr Daneil Dunker  PLEASE NOTE:  If you had any lab tests, please let us  know if you have not heard back within a few days. You may see your results on mychart before we have a chance to review them but we will give you a call once they are reviewed by us .   If we ordered any referrals today, please let us  know if you have not heard from their office within the next week.   If you had any urgent prescriptions sent in today, please check with the pharmacy within an hour of our visit to make sure the prescription was transmitted appropriately.   Please try these tips to maintain a healthy lifestyle:  Eat at least 3 REAL meals and 1-2 snacks per day.  Aim for no more than 5 hours between eating.  If you eat breakfast, please do so within one hour of getting up.   Each meal should contain half fruits/vegetables, one quarter protein, and one quarter carbs (no bigger than a computer mouse)  Cut down on sweet beverages. This includes juice, soda, and sweet tea.   Drink at least 1 glass of water with each meal and aim for at least 8 glasses per day  Exercise at least 150 minutes every week.

## 2024-02-21 ENCOUNTER — Encounter: Payer: Self-pay | Admitting: Physician Assistant

## 2024-02-26 ENCOUNTER — Encounter: Payer: Self-pay | Admitting: Family Medicine

## 2024-02-26 NOTE — Progress Notes (Signed)
 X-ray is normal.  No signs of infection.  He should let us  know if his symptoms are not improving.

## 2024-02-27 NOTE — Telephone Encounter (Signed)
Date of birth verified by patient  results given,Pt verbalized understanding

## 2024-02-28 ENCOUNTER — Telehealth: Payer: Self-pay | Admitting: *Deleted

## 2024-02-28 NOTE — Telephone Encounter (Signed)
 Copied from CRM [id number]. Topic: Clinical - Lab/Test Results >> Feb 27, 2024 11:30 AM [place] J wrote: Reason for CRM: Patient calling in to see if he could get a call back in regards to his imaging he got done on 02/25/2024.

## 2024-02-28 NOTE — Telephone Encounter (Signed)
 Spoke to pt told him Chest X-ray is normal.  No signs of infection.  He should let us  know if his symptoms are not improving. Pt said he was aware of result someone spoke to him yesterday. Told him okay.

## 2024-03-24 ENCOUNTER — Encounter: Payer: Self-pay | Admitting: Physician Assistant

## 2024-03-24 ENCOUNTER — Ambulatory Visit (INDEPENDENT_AMBULATORY_CARE_PROVIDER_SITE_OTHER): Admitting: Physician Assistant

## 2024-03-24 VITALS — BP 96/61 | HR 80 | Ht 70.0 in

## 2024-03-24 DIAGNOSIS — R413 Other amnesia: Secondary | ICD-10-CM

## 2024-03-24 MED ORDER — DONEPEZIL HCL 5 MG PO TABS: ORAL_TABLET | ORAL | 3 refills | Status: DC

## 2024-03-24 NOTE — Patient Instructions (Signed)
 It was a pleasure to see you today at our office.   Recommendations:  Replenish the B12 Continue Memantine  10 mg at night  Start donepezil 5 mg daily  Follow up in   6 months Recommend visiting the website : " Dementia [facility]" to better understand some behaviors related to memory loss.    For psychiatric meds, mood meds: Please have your primary care physician manage these medications.  If you have any severe symptoms of a stroke, or other severe issues such as confusion,severe chills or fever, etc call 911 or go to the ER as you may need to be evaluated further   For guidance regarding [facility] and if placement were needed at the facility, contact Social Worker tel: [phone]  For assessment of decision of mental capacity and competency:  Call Dr. Laverne Potter, geriatric psychiatrist at [phone]  Counseling regarding caregiver distress, including caregiver depression, anxiety and issues regarding community resources, adult day care programs, adult living facilities, or memory care questions:  please contact your  Primary Doctor's Social Worker   Whom to call: Memory  decline, memory medications: Call our office [phone]    [email or web address]   RECOMMENDATIONS FOR ALL PATIENTS WITH MEMORY PROBLEMS: 1. Continue to exercise (Recommend 30 minutes of walking everyday, or 3 hours every week) 2. Increase social interactions - continue going to [place] and enjoy social gatherings with friends and family 3. Eat healthy, avoid fried foods and eat more fruits and vegetables 4. Maintain adequate blood pressure, blood sugar, and blood cholesterol level. Reducing the risk of stroke and cardiovascular disease also helps promoting better memory. 5. Avoid stressful situations. Live a simple life and avoid aggravations. Organize your time and prepare for the next day in anticipation. 6. Sleep well, avoid  any interruptions of sleep and avoid any distractions in the bedroom that may interfere with adequate sleep quality 7. Avoid sugar, avoid sweets as there is a strong link between excessive sugar intake, diabetes, and cognitive impairment We discussed the Mediterranean diet, which has been shown to help patients reduce the risk of progressive memory disorders and reduces cardiovascular risk. This includes eating fish, eat fruits and green leafy vegetables, nuts like almonds and hazelnuts, walnuts, and also use olive oil. Avoid fast foods and fried foods as much as possible. Avoid sweets and sugar as sugar use has been linked to worsening of memory function.  There is always a concern of gradual progression of memory problems. If this is the case, then we may need to adjust level of care according to patient needs. Support, both to the patient and caregiver, should then be put into place.        DRIVING: Regarding driving, in patients with progressive memory problems, driving will be impaired. We advise to have someone else do the driving if trouble finding directions or if minor accidents are reported. Independent driving assessment is available to determine safety of driving.   If you are interested in the driving assessment, you can contact the following:  [employer] in [place] [phone]  [facility] [phone]  [facility] [phone]  [facility] [phone] or [phone]   FALL PRECAUTIONS: Be cautious when walking. Scan the area for obstacles that may increase the risk of trips and falls. When getting up in the mornings, sit up at the edge of the bed for a few minutes before getting out of bed. Consider elevating the bed at the head end to avoid drop  of blood pressure when getting up. Walk always in a well-lit room (use night lights in the walls). Avoid area rugs or power cords from appliances in the middle of the walkways. Use  a walker or a cane if necessary and consider physical therapy for balance exercise. Get your eyesight checked regularly.  FINANCIAL OVERSIGHT: Supervision, especially oversight when making financial decisions or transactions is also recommended.  HOME SAFETY: Consider the safety of the kitchen when operating appliances like stoves, microwave oven, and blender. Consider having supervision and share cooking responsibilities until no longer able to participate in those. Accidents with firearms and other hazards in the house should be identified and addressed as well.   ABILITY TO BE LEFT ALONE: If patient is unable to contact 911 operator, consider using LifeLine, or when the need is there, arrange for someone to stay with patients. Smoking is a fire hazard, consider supervision or cessation. Risk of wandering should be assessed by caregiver and if detected at any point, supervision and safe proof recommendations should be instituted.  MEDICATION SUPERVISION: Inability to self-administer medication needs to be constantly addressed. Implement a mechanism to ensure safe administration of the medications.      Mediterranean Diet A Mediterranean diet refers to food and lifestyle choices that are based on the traditions of countries located on the [employer]. This way of eating has been shown to help prevent certain conditions and improve outcomes for people who have chronic diseases, like kidney disease and heart disease. What are tips for following this plan? Lifestyle  Cook and eat meals together with your family, when possible. Drink enough fluid to keep your urine clear or pale yellow. Be physically active every day. This includes: Aerobic exercise like running or swimming. Leisure activities like gardening, walking, or housework. Get 7-8 hours of sleep each night. If recommended by your health care provider, drink red wine in moderation. This means 1 glass a day for nonpregnant women  and 2 glasses a day for men. A glass of wine equals 5 oz (150 mL). Reading food labels  Check the serving size of packaged foods. For foods such as rice and pasta, the serving size refers to the amount of cooked product, not dry. Check the total fat in packaged foods. Avoid foods that have saturated fat or trans fats. Check the ingredients list for added sugars, such as corn syrup. Shopping  At the grocery store, buy most of your food from the areas near the walls of the store. This includes: Fresh fruits and vegetables (produce). Grains, beans, nuts, and seeds. Some of these may be available in unpackaged forms or large amounts (in bulk). Fresh seafood. Poultry and eggs. Low-fat dairy products. Buy whole ingredients instead of prepackaged foods. Buy fresh fruits and vegetables in-season from local farmers markets. Buy frozen fruits and vegetables in resealable bags. If you do not have access to quality fresh seafood, buy precooked frozen shrimp or canned fish, such as tuna, salmon, or sardines. Buy small amounts of raw or cooked vegetables, salads, or olives from the deli or salad bar at your store. Stock your pantry so you always have certain foods on hand, such as olive oil, canned tuna, canned tomatoes, rice, pasta, and beans. Cooking  Cook foods with extra-virgin olive oil instead of using butter or other vegetable oils. Have meat as a side dish, and have vegetables or grains as your main dish. This means having meat in small portions or adding small amounts of meat to foods  like pasta or stew. Use beans or vegetables instead of meat in common dishes like chili or lasagna. Experiment with different cooking methods. Try roasting or broiling vegetables instead of steaming or sauteing them. Add frozen vegetables to soups, stews, pasta, or rice. Add nuts or seeds for added healthy fat at each meal. You can add these to yogurt, salads, or vegetable dishes. Marinate fish or vegetables using  olive oil, lemon juice, garlic, and fresh herbs. Meal planning  Plan to eat 1 vegetarian meal one day each week. Try to work up to 2 vegetarian meals, if possible. Eat seafood 2 or more times a week. Have healthy snacks readily available, such as: Vegetable sticks with hummus. Greek yogurt. Fruit and nut trail mix. Eat balanced meals throughout the week. This includes: Fruit: 2-3 servings a day Vegetables: 4-5 servings a day Low-fat dairy: 2 servings a day Fish, poultry, or lean meat: 1 serving a day Beans and legumes: 2 or more servings a week Nuts and seeds: 1-2 servings a day Whole grains: 6-8 servings a day Extra-virgin olive oil: 3-4 servings a day Limit red meat and sweets to only a few servings a month What are my food choices? Mediterranean diet Recommended Grains: Whole-grain pasta. Brown rice. Bulgar wheat. Polenta. Couscous. Whole-wheat bread. Dwyane Glad. Vegetables: Artichokes. Beets. Broccoli. Cabbage. Carrots. Eggplant. Green beans. Chard. Kale. Spinach. Onions. Leeks. Peas. Squash. Tomatoes. Peppers. Radishes. Fruits: Apples. Apricots. Avocado. Berries. Bananas. Cherries. Dates. Figs. Grapes. Lemons. Melon. Oranges. Peaches. Plums. Pomegranate. Meats and other protein foods: Beans. Almonds. Sunflower seeds. Pine nuts. Peanuts. Cod. Salmon. Scallops. Shrimp. Tuna. Tilapia. Clams. Oysters. Eggs. Dairy: Low-fat milk. Cheese. Greek yogurt. Beverages: Water. Red wine. Herbal tea. Fats and oils: Extra virgin olive oil. Avocado oil. Grape seed oil. Sweets and desserts: [place] yogurt with honey. Baked apples. Poached pears. Trail mix. Seasoning and other foods: Basil. Cilantro. Coriander. Cumin. Mint. Parsley. Sage. Rosemary. Tarragon. Garlic. Oregano. Thyme. Pepper. Balsalmic vinegar. Tahini. Hummus. Tomato sauce. Olives. Mushrooms. Limit these Grains: Prepackaged pasta or rice dishes. Prepackaged cereal with added sugar. Vegetables: Deep fried potatoes (french  fries). Fruits: Fruit canned in syrup. Meats and other protein foods: Beef. Pork. Lamb. Poultry with skin. Hot dogs. Helene Loader. Dairy: Ice cream. Sour cream. Whole milk. Beverages: Juice. Sugar-sweetened soft drinks. Beer. Liquor and spirits. Fats and oils: Butter. Canola oil. Vegetable oil. Beef fat (tallow). Lard. Sweets and desserts: Cookies. Cakes. Pies. Candy. Seasoning and other foods: Mayonnaise. Premade sauces and marinades. The items listed may not be a complete list. Talk with your dietitian about what dietary choices are right for you. Summary The Mediterranean diet includes both food and lifestyle choices. Eat a variety of fresh fruits and vegetables, beans, nuts, seeds, and whole grains. Limit the amount of red meat and sweets that you eat. Talk with your health care provider about whether it is safe for you to drink red wine in moderation. This means 1 glass a day for nonpregnant women and 2 glasses a day for men. A glass of wine equals 5 oz (150 mL). This information is not intended to replace advice given to you by your health care provider. Make sure you discuss any questions you have with your health care provider. Document Released: 06/07/2016 Document Revised: 07/10/2016 Document Reviewed: 06/07/2016 Elsevier Interactive Patient Education  2017 [employer].

## 2024-03-24 NOTE — Progress Notes (Signed)
 Assessment/Plan:   Dementia likely due to mixed Alzheimer's and vascular disease   Trevor [name] is a very pleasant 78 y.o. RH male with a history ofGERD, possible "eye TIA with negative MRI" in October 2024, hyperlipidemia, BPH, Gilbert syndrome, CAD, prior history of acute pericarditis, vitamin D  deficiency, COPD and a history of dementia likely due to Alzheimer disease and vascular etiology seen today in follow up for memory loss. Patient is currently on memantine  10 mg once a day, cut from 2 times a day due to increased sleepiness with it. Discussed adding donepezil  to the regimen for added coverage, he agrees to proceed. Mood is good. Despite prior MoCA score, he is able to participate on ADLs and to drive without difficulties    Continue memantine  10 mg twice daily, side effects discussed Start donepezil  5 mg daily, side effects discussed . Replenish B12 Recommend good control of her cardiovascular risk factors Continue to control mood as per PCP Agree with sleep studies  Monitor driving     Subjective:    This patient is accompanied in the office by his wife  who supplements the history.  Previous records as well as any outside records available were reviewed prior to todays visit. Patient was last seen on 01/08/2024 with MoCA 3/30    Any changes in memory since last visit? " About the same".Patient has some difficulty remembering recent conversations and names of people, LTM is also affected  repeats oneself?  Endorsed Disoriented when walking into a room? Denies    Leaving objects?  May misplace things but not in unusual places   Wandering behavior?  denies   Any personality changes since last visit? May have some moments of irritability.    Any worsening depression?:  Denies.   Hallucinations or paranoia? " 2 weeks ago I saw someone walking across when I was lying down".   Seizures? denies    Any sleep changes?  Sleeps well. May have had recent vivid dreams,  talked to "someone"saying "get out".  Possible REM behavior or sleepwalking. PCP suspects that there is a component of sleep disorder, patient to undergo sleep study soon. Sleep apnea?   Denies.   Any hygiene concerns? Denies.  Independent of bathing and dressing?  Endorsed  Does the patient needs help with medications? Patient  is in charge   Who is in charge of the finances? Both patient and wife are  in charge     Any changes in appetite?  denies     Patient have trouble swallowing? Denies.   Does the patient cook? No Any headaches?   denies   Any vision changes? Denies  Chronic back pain  denies Ambulates with difficulty? Denies.    Recent falls or head injuries? Denies.     Unilateral weakness, numbness or tingling? denies   Any tremors?  Denies   Any anosmia?  Denies   Any incontinence of urine?  Endorsed   Any bowel dysfunction?   Denies      Patient lives  with his wife    Does the patient drive?  Yes, he denies getting lost  Initial visit 01/08/2024 How long did patient have memory difficulties?  For the last 3-4 years after a "very bad Covid", but worse over the last year, "better and worse days "-wife says. Patient has difficulty remembering new information, recent conversations and names of people. LTM is affected as well.  repeats oneself?  Endorsed Disoriented when walking into a room?  Denies  except occasionally not remembering what patient came to the room for Leaving objects in unusual places?  He may lose the glasses in different places, "but not in the trash".  Wandering behavior? Denies.   Any personality changes, or depression, anxiety?  Has moments of irritability.  Hallucinations or paranoia?  Denies  Seizures? Denies.    Any sleep changes?   For the last 3 nights he has been screaming "get out of here ". Wife reports REM behavior moving his hands as if he were reenacting the dream.  Denies sleepwalking.  Wife was concerned "being a [place], the devil is coming  to visit as with having to go away " Sleep apnea? Denies.   Any hygiene concerns?  Denies.   Independent of bathing and dressing?  Yes Who is in charge of the medications? Patient is in charge   Who is in charge of the finances? Both in charge     Any changes in appetite?   Denies.     Patient have trouble swallowing?  Denies.   Does the patient cook?  No  Any headaches?  Denies.   Chronic back pain?  Denies.   Ambulates with difficulty? Needs a walker to ambulate     Recent falls or head injuries? Denies.     Vision changes? Denies. Stroke like symptoms?  He is asymptomatic for stroke, he was told that on his right eye, during an eye exam he may have a stroke, but an MRI of the brain did not yield any other abnormalities. Any tremors?  Denies.   Any anosmia?  Denies.   Any incontinence of urine? Denies.   Any bowel dysfunction? Denies.      Patient lives  his wife  History of heavy alcohol intake? Denies.   History of heavy tobacco use? Denies.   Family history of dementia?  Denies  Does patient drive? Denies any issues, wife reports that he is excellent driver.  MRI of the brain 08/07/2023, personally reviewed without acute intracranial abnormalities, age related cerebral with mild to moderate chronic microvascular ischemic disease   PREVIOUS MEDICATIONS:   CURRENT MEDICATIONS:  Outpatient Encounter Medications as of 03/24/2024  Medication Sig   donepezil (ARICEPT) 5 MG tablet Take one tablet daily   Albuterol -Budesonide (AIRSUPRA ) 90-80 MCG/ACT AERO Inhale 1 puff into the lungs every 4 (four) hours as needed.   Ascorbic Acid  (VITA-C PO) Take 1 tablet by mouth daily.   aspirin  81 MG tablet Take 81 mg by mouth daily.     Cholecalciferol  (VITAMIN D ) 2000 UNITS CAPS Take 2,000 Units by mouth daily.    fish oil-omega-3 fatty acids  1000 MG capsule Take 1 g by mouth daily.    Magnesium  250 MG TABS Take 250 mg by mouth daily.    memantine  (NAMENDA ) 10 MG tablet Take 1 tablet (10 mg at  night) for 2 weeks, then increase to 1 tablet (10 mg) twice a day   metroNIDAZOLE (METROCREAM) 0.75 % cream Apply 1 application topically as needed.   Multiple Vitamins-Minerals (MENS 50+ MULTI VITAMIN/MIN PO) Take 1 tablet daily by mouth.   pantoprazole  (PROTONIX ) 40 MG tablet TAKE 1 TABLET(40 MG) BY MOUTH DAILY   Turmeric (QC TUMERIC COMPLEX PO) Take by mouth.   Zinc 25 MG TABS Take 1 tablet by mouth daily.   No facility-administered encounter medications on file as of 03/24/2024.       09/22/2018   11:31 AM 09/11/2017    8:58 AM  MMSE - Mini Mental  State Exam  Not completed: -- --      01/08/2024    1:00 PM  Montreal Cognitive Assessment   Visuospatial/ Executive (0/5) 0  Naming (0/3) 0  Attention: Read list of digits (0/2) 0  Attention: Read list of letters (0/1) 1  Attention: Serial 7 subtraction starting at 100 (0/3) 0  Language: Repeat phrase (0/2) 1  Language : Fluency (0/1) 0  Abstraction (0/2) 0  Delayed Recall (0/5) 0  Orientation (0/6) 1  Total 3  Adjusted Score (based on education) 3    Objective:     PHYSICAL EXAMINATION:    VITALS:   Vitals:   03/24/24 1503  Height: 5\' 10"  (1.778 m)    GEN:  The patient appears stated age and is in NAD. HEENT:  Normocephalic, atraumatic.   Neurological examination:  General: NAD, well-groomed, appears stated age. Orientation: The patient is alert. Oriented to person, not to place and date Cranial nerves: There is good facial symmetry.The speech is fluent and clear. No aphasia or dysarthria. Fund of knowledge is reduced. Recent and remote memory are impaired. Attention and concentration are reduced. Able to name objects and repeat phrases.  Hearing is intact to conversational tone.  Sensation: Sensation is intact to light touch throughout Motor: Strength is at least antigravity x4. DTR's 2/4 in UE/LE     Movement examination: Tone: There is normal tone in the UE/LE Abnormal movements:  no tremor.  No myoclonus.   No asterixis.   Coordination:  There is no decremation with RAM's. Normal finger to nose  Gait and Station: The patient has no difficulty arising out of a deep-seated chair without the use of the hands. The patient's stride length is good.  Gait is cautious and narrow.    Thank you for allowing us  the opportunity to participate in the care of this nice patient. Please do not hesitate to contact us  for any questions or concerns.   Total time spent on today's visit was 23 minutes dedicated to this patient today, preparing to see patient, examining the patient, ordering tests and/or medications and counseling the patient, documenting clinical information in the EHR or other health record, independently interpreting results and communicating results to the patient/family, discussing treatment and goals, answering patient's questions and coordinating care.  Cc:  Rodney Clamp, MD  Tex Filbert 03/24/2024 3:26 PM

## 2024-04-15 ENCOUNTER — Ambulatory Visit: Admitting: Physician Assistant

## 2024-05-22 ENCOUNTER — Encounter: Payer: Self-pay | Admitting: Family Medicine

## 2024-05-22 ENCOUNTER — Ambulatory Visit (INDEPENDENT_AMBULATORY_CARE_PROVIDER_SITE_OTHER): Admitting: Family Medicine

## 2024-05-22 VITALS — BP 101/59 | HR 63 | Temp 97.3°F | Ht 70.0 in | Wt 143.4 lb

## 2024-05-22 DIAGNOSIS — G309 Alzheimer's disease, unspecified: Secondary | ICD-10-CM | POA: Diagnosis not present

## 2024-05-22 DIAGNOSIS — F0280 Dementia in other diseases classified elsewhere without behavioral disturbance: Secondary | ICD-10-CM

## 2024-05-22 DIAGNOSIS — J45990 Exercise induced bronchospasm: Secondary | ICD-10-CM

## 2024-05-22 NOTE — Assessment & Plan Note (Addendum)
 Patient diagnosed with combination of Alzheimer's and vascular dementia per neurology earlier this year.  He did have MoCA score 3 out of 30 initially however his overall clinical picture is not congruent with this level score.  He still manages most of his ADLs and IADLs at home.  Though does require some assistance with housework and tasks involving reading or writing.  He still was very active and runs 3 times per week and is able to be a part of the community.  He likely needs further neurocognitive evaluation.  Will place referral for him to get a second opinion today.  We did discuss lifestyle modifications to improve his overall health including his cognitive function.  We discussed importance of a heart healthy diet such as the Mediterranean diet.  He is doing a great job with exercise and will continue doing this.  We also did discuss importance of maintaining cognitive function via daily brain exercises such as crosswords, sudoku, word games, card games, tabletop games, etc.

## 2024-05-22 NOTE — Assessment & Plan Note (Signed)
 Overall symptoms are stable.  Did not have any benefit with Airsupra .  Overall symptoms are currently manageable.  We did discuss trial of alternative inhaler however he like to hold off on this for now.  Recheck again at his CPE later this year.  If this continues to be an issue would consider referral to pulmonology.

## 2024-05-22 NOTE — Patient Instructions (Addendum)
 It was very nice to see you today!  I will forward you to a neurologist for second opinion.  Please continue to eat a heart healthy diet.  Mediterranean diet is a good eating plan to try to follow.  Please continue to keep your mind and body active.  I will refer you for home health also.  We will see you back later this year when you are due for your annual physical.  Come back sooner if needed.  Return for Annual Physical.   Take care, Dr Kennyth  PLEASE NOTE:  If you had any lab tests, please let us  know if you have not heard back within a few days. You may see your results on mychart before we have a chance to review them but we will give you a call once they are reviewed by us .   If we ordered any referrals today, please let us  know if you have not heard from their office within the next week.   If you had any urgent prescriptions sent in today, please check with the pharmacy within an hour of our visit to make sure the prescription was transmitted appropriately.   Please try these tips to maintain a healthy lifestyle:  Eat at least 3 REAL meals and 1-2 snacks per day.  Aim for no more than 5 hours between eating.  If you eat breakfast, please do so within one hour of getting up.   Each meal should contain half fruits/vegetables, one quarter protein, and one quarter carbs (no bigger than a computer mouse)  Cut down on sweet beverages. This includes juice, soda, and sweet tea.   Drink at least 1 glass of water with each meal and aim for at least 8 glasses per day  Exercise at least 150 minutes every week.

## 2024-05-22 NOTE — Progress Notes (Signed)
   Trevor [name] is a 78 y.o. male who presents today for an office visit.  Assessment/Plan:  Chronic Problems Addressed Today: Dementia (HCC) Patient diagnosed with combination of Alzheimer's and vascular dementia per neurology earlier this year.  He did have MoCA score 3 out of 30 initially however his overall clinical picture is not congruent with this level score.  He still manages most of his ADLs and IADLs at home.  Though does require some assistance with housework and tasks involving reading or writing.  He still was very active and runs 3 times per week and is able to be a part of the community.  He likely needs further neurocognitive evaluation.  Will place referral for him to get a second opinion today.  We did discuss lifestyle modifications to improve his overall health including his cognitive function.  We discussed importance of a heart healthy diet such as the Mediterranean diet.  He is doing a great job with exercise and will continue doing this.  We also did discuss importance of maintaining cognitive function via daily brain exercises such as crosswords, sudoku, word games, card games, tabletop games, etc.  Exercise-induced asthma Overall symptoms are stable.  Did not have any benefit with Airsupra .  Overall symptoms are currently manageable.  We did discuss trial of alternative inhaler however he like to hold off on this for now.  Recheck again at his CPE later this year.  If this continues to be an issue would consider referral to pulmonology.     Subjective:  HPI:  Patient is here today for follow-up.  I saw him about 3 months ago.  At that time we discussed his recent diagnosis of dementia.  He was on memantine  at that time.  Since our last visit he followed up with neurology and they added on donezepil. He has tried taking higher doses of memantine  but cannot tolerate due to somnolence. 6 overall his symptoms are about the same since our last visit.  At our last  visit we also discussed his cough and started him on Airsupra  for his exercise-induced asthma. HE did not find this to be effective.        Objective:  Physical Exam: BP (!) 101/59   Pulse 63   Temp (!) 97.3 F (36.3 C) (Temporal)   Ht 5' 10 (1.778 m)   Wt 143 lb 6.4 oz (65 kg)   SpO2 97%   BMI 20.58 kg/m   Gen: No acute distress, resting comfortably CV: Regular rate and rhythm with no murmurs appreciated Pulm: Normal work of breathing, clear to auscultation bilaterally with no crackles, wheezes, or rhonchi Neuro: Grossly normal, moves all extremities.  No apparent deficits.  Alert and oriented.  Cooperative and interactive with exam.  Psych: Normal affect and thought content      Allen Basista M. Kennyth, MD 05/22/2024 2:22 PM

## 2024-06-15 ENCOUNTER — Telehealth: Payer: Self-pay | Admitting: Family Medicine

## 2024-06-15 DIAGNOSIS — Z7982 Long term (current) use of aspirin: Secondary | ICD-10-CM

## 2024-06-15 DIAGNOSIS — J45990 Exercise induced bronchospasm: Secondary | ICD-10-CM | POA: Diagnosis not present

## 2024-06-15 DIAGNOSIS — Z87891 Personal history of nicotine dependence: Secondary | ICD-10-CM | POA: Diagnosis not present

## 2024-06-15 DIAGNOSIS — F0280 Dementia in other diseases classified elsewhere without behavioral disturbance: Secondary | ICD-10-CM | POA: Diagnosis not present

## 2024-06-15 DIAGNOSIS — G309 Alzheimer's disease, unspecified: Secondary | ICD-10-CM | POA: Diagnosis not present

## 2024-06-15 NOTE — Telephone Encounter (Signed)
 [facility] faxed document Home Health Certificate (Order [place] [id number]), to be filled out by provider. Patient requested to send it back via Fax within ASAP. Document is located in providers tray at front office.Please advise at [phone].

## 2024-06-16 NOTE — Telephone Encounter (Signed)
Form placed to be reviewed in PCP office

## 2024-06-16 NOTE — Telephone Encounter (Signed)
 Form faxed to [phone] Placed to be scan in Patient chart

## 2024-07-06 ENCOUNTER — Ambulatory Visit: Payer: Self-pay

## 2024-07-06 NOTE — Telephone Encounter (Signed)
 Left message to return call to our office at their convenience.

## 2024-07-06 NOTE — Telephone Encounter (Signed)
  No triage: Wife sandra called to make appt for concerns but didn't want to go into detail. Appts offered throughout week and pt told wife I'll think about it and call back.      Copied from CRM [id number]. Topic: Clinical - Red Word Triage >> Jul 06, 2024  2:15 PM Rosina D wrote: Reason for RMF:[id number] wife called stating the patient is not feeling right due to his dementia and having trouble with it

## 2024-07-13 ENCOUNTER — Ambulatory Visit (INDEPENDENT_AMBULATORY_CARE_PROVIDER_SITE_OTHER): Admitting: Family Medicine

## 2024-07-13 ENCOUNTER — Encounter: Payer: Self-pay | Admitting: Family Medicine

## 2024-07-13 VITALS — BP 102/64 | HR 69 | Temp 98.0°F | Ht 70.0 in | Wt 142.8 lb

## 2024-07-13 DIAGNOSIS — G309 Alzheimer's disease, unspecified: Secondary | ICD-10-CM

## 2024-07-13 DIAGNOSIS — F0280 Dementia in other diseases classified elsewhere without behavioral disturbance: Secondary | ICD-10-CM | POA: Diagnosis not present

## 2024-07-13 NOTE — Progress Notes (Signed)
   Trevor [name] is a 78 y.o. male who presents today for an office visit.  Assessment/Plan:  Chronic Problems Addressed Today: Dementia (HCC) Had a lengthy discussion with patient and his wife today regarding his dementia.  He has not had any significant change in his symptoms since our last visit.  He has been working with home health though is still interested in possible additional resources to help with housework and tasks involving reading or writing at home.  Will place referral to [facility] to see if they have any further options to help him with this.  We did refer him for second opinion as his 3 out of 30 MoCA score is not consistent with his overall clinical picture.  He will not be able to see the neurologist until January.  He is currently on donepezil  and amantadine and tolerating both of these well.  We also did discuss lifestyle modifications to improve cognitive function including diet and exercise as well as daily brain exercises such as crossword, word game, card games, puzzles, etc.     Subjective:  HPI:  See assessment / plan for status of chronic conditions.   Discussed the use of AI scribe software for clinical note transcription with the patient, who gave verbal consent to proceed.  History of Present Illness Trevor [name] is a 78 year old male who presents with concerns of brain fog and intermittent pain.  He experiences a persistent sensation of 'fog' in his head, which he distinguishes from confusion. Despite this sensation, he maintains good sleep, remains active, and participates in activities such as driving and running. The sensation is not severe enough to impair his focus or ability to stay on task. No confusion, headaches, or issues with driving or getting lost are reported.  He experiences intermittent soreness that comes and goes quickly, with more instances of it going than coming. No headaches are reported.He has not experienced any changes  in symptoms since the last visit.  He receives support from various home health services, including a physical therapist and a Child psychotherapist, who provide resources and assistance. He finds it challenging to manage household tasks due to chronic fatigue and is seeking additional help.         Objective:  Physical Exam: BP 102/64   Pulse 69   Temp 98 F (36.7 C) (Temporal)   Ht 5' 10 (1.778 m)   Wt 142 lb 12.8 oz (64.8 kg)   SpO2 99%   BMI 20.49 kg/m   Gen: No acute distress, resting comfortably Neuro: Grossly normal, moves all extremities Psych: Normal affect and thought content      Selby Slovacek M. Kennyth, MD 07/13/2024 2:12 PM

## 2024-07-13 NOTE — Assessment & Plan Note (Signed)
 Had a lengthy discussion with patient and his wife today regarding his dementia.  He has not had any significant change in his symptoms since our last visit.  He has been working with home health though is still interested in possible additional resources to help with housework and tasks involving reading or writing at home.  Will place referral to [facility] to see if they have any further options to help him with this.  We did refer him for second opinion as his 3 out of 30 MoCA score is not consistent with his overall clinical picture.  He will not be able to see the neurologist until January.  He is currently on donepezil  and amantadine and tolerating both of these well.  We also did discuss lifestyle modifications to improve cognitive function including diet and exercise as well as daily brain exercises such as crossword, word game, card games, puzzles, etc.

## 2024-07-13 NOTE — Patient Instructions (Signed)
 It was very nice to see you today!  VISIT SUMMARY: Today, we discussed your concerns about brain fog and intermittent pain. You are staying active and managing well despite these symptoms. We also talked about the support you are receiving from home health services and explored additional resources to help you manage household tasks.  YOUR PLAN: BRAIN FOG: -Continue with your physical and cognitive activities. -Keep your appointment with Dr. Onita on January 12th for a neurological evaluation. -We will refer you to [facility] for additional resources and support. -Explore senior resources and memory care options.   GENERAL HEALTH MAINTENANCE: You are receiving support from home health services, including a physical therapist and a Child psychotherapist. -Continue utilizing the support from your home health services. -Seek additional help for managing household tasks due to chronic fatigue.  Return if symptoms worsen or fail to improve.   Take care, Dr Kennyth  PLEASE NOTE:  If you had any lab tests, please let us  know if you have not heard back within a few days. You may see your results on mychart before we have a chance to review them but we will give you a call once they are reviewed by us .   If we ordered any referrals today, please let us  know if you have not heard from their office within the next week.   If you had any urgent prescriptions sent in today, please check with the pharmacy within an hour of our visit to make sure the prescription was transmitted appropriately.   Please try these tips to maintain a healthy lifestyle:  Eat at least 3 REAL meals and 1-2 snacks per day.  Aim for no more than 5 hours between eating.  If you eat breakfast, please do so within one hour of getting up.   Each meal should contain half fruits/vegetables, one quarter protein, and one quarter carbs (no bigger than a computer mouse)  Cut down on sweet beverages. This includes juice, soda, and sweet tea.    Drink at least 1 glass of water with each meal and aim for at least 8 glasses per day  Exercise at least 150 minutes every week.

## 2024-07-17 ENCOUNTER — Telehealth: Payer: Self-pay | Admitting: *Deleted

## 2024-07-17 NOTE — Progress Notes (Signed)
 Complex Care Management Note Care Guide Note  07/17/2024 Name: Trevor [name] MRN: [id number] DOB: 06-15-1946   Complex Care Management Outreach Attempts: An unsuccessful telephone outreach was attempted today to offer the patient information about available complex care management services.  Follow Up Plan:  Additional outreach attempts will be made to offer the patient complex care management information and services.   Encounter Outcome:  No Answer  Thedford Franks, CMA [place]  [facility], [facility] Guide Direct Dial: [phone]  Fax: [phone] Website: [email or web address]

## 2024-07-20 NOTE — Progress Notes (Unsigned)
 Complex Care Management Note Care Guide Note  07/20/2024 Name: Trevor [name] MRN: [id number] DOB: 27-Jun-1946   Complex Care Management Outreach Attempts: A second unsuccessful outreach was attempted today to offer the patient with information about available complex care management services.  Follow Up Plan:  Additional outreach attempts will be made to offer the patient complex care management information and services.   Encounter Outcome:  No Answer  Thedford Franks, CMA [place]  [facility], [facility] Guide Direct Dial: [phone]  Fax: [phone] Website: [email or web address]

## 2024-07-21 NOTE — Progress Notes (Signed)
 Complex Care Management Note  Care Guide Note 07/21/2024 Name: YANNIS BROCE MRN: [id number] DOB: 05-22-1946  DELNO BLAISDELL is a 78 y.o. year old male who sees Kennyth Worth CHRISTELLA, MD for primary care. I reached out to Gilmore CHRISTELLA Jumbo by phone today to offer complex care management services.  Mr. Geisen was given information about Complex Care Management services today including:   The Complex Care Management services include support from the care team which includes your Nurse Care Manager, Clinical Social Worker, or Pharmacist.  The Complex Care Management team is here to help remove barriers to the health concerns and goals most important to you. Complex Care Management services are voluntary, and the patient may decline or stop services at any time by request to their care team member.   Complex Care Management Consent Status: Patient agreed to services and verbal consent obtained.   Follow up plan:  Telephone appointment with complex care management team member scheduled for:  08/03/2024  Encounter Outcome:  Patient Scheduled  Thedford Franks, CMA [place]  [facility], [facility] Guide Direct Dial: [phone]  Fax: [phone] Website: [email or web address]

## 2024-08-03 ENCOUNTER — Other Ambulatory Visit: Payer: Self-pay | Admitting: Licensed Clinical Social Worker

## 2024-09-07 ENCOUNTER — Ambulatory Visit: Admitting: Family Medicine

## 2024-09-07 VITALS — BP 120/80 | Ht 70.0 in | Wt 142.0 lb

## 2024-09-07 DIAGNOSIS — M25561 Pain in right knee: Secondary | ICD-10-CM | POA: Diagnosis not present

## 2024-09-07 DIAGNOSIS — M25562 Pain in left knee: Secondary | ICD-10-CM | POA: Diagnosis not present

## 2024-09-07 NOTE — Progress Notes (Signed)
 PCP: Kennyth Worth HERO, MD  Discussed the use of AI scribe software for clinical note transcription with the patient, who gave verbal consent to proceed.  History of Present Illness Trevor [name] is a 78 year old male with knee arthritis who presents with knee clicking and limited extension.  Knee mechanical symptoms and range of motion limitation - Intermittent clicking in the right knee - Difficulty achieving full extension in the left knee - Catching sensation in the knees attributed to arthritis - Primary limitation is mechanical (catching and limited extension) rather than significant pain  Knee pain - Occasional knee pain, particularly during running - No history of falls or specific injury  Functional status and activity level - Active runner, typically participates in 5K, 10K, and occasional half marathon races  Prior treatments and response - Previously completed physical therapy - Received cortisone injections, which were effective for a couple of years  Past Medical History:  Diagnosis Date   Acute pericarditis    a. admitted as a code STEMI but no sig CAD on cath.    CAD (coronary artery disease)    30% stenosis mid RCA   Colonic polyp    Dr. Luis; hyperplastic polyp   Bertrum syndrome    Hyperlipidemia     Current Outpatient Medications on File Prior to Visit  Medication Sig Dispense Refill   Ascorbic Acid  (VITA-C PO) Take 1 tablet by mouth daily.     aspirin  81 MG tablet Take 81 mg by mouth daily.       Cholecalciferol  (VITAMIN D ) 2000 UNITS CAPS Take 2,000 Units by mouth daily.      donepezil  (ARICEPT ) 5 MG tablet Take one tablet daily 90 tablet 3   fish oil-omega-3 fatty acids  1000 MG capsule Take 1 g by mouth daily.      Magnesium  250 MG TABS Take 250 mg by mouth daily.      memantine  (NAMENDA ) 10 MG tablet Take 1 tablet (10 mg at night) for 2 weeks, then increase to 1 tablet (10 mg) twice a day 60 tablet 11   metroNIDAZOLE (METROCREAM) 0.75  % cream Apply 1 application topically as needed.     Multiple Vitamins-Minerals (MENS 50+ MULTI VITAMIN/MIN PO) Take 1 tablet daily by mouth.     pantoprazole  (PROTONIX ) 40 MG tablet TAKE 1 TABLET(40 MG) BY MOUTH DAILY 90 tablet 1   Turmeric (QC TUMERIC COMPLEX PO) Take by mouth.     Zinc 25 MG TABS Take 1 tablet by mouth daily.     No current facility-administered medications on file prior to visit.    Past Surgical History:  Procedure Laterality Date   actinic keratoses     facial removed x 2, Dr Helga   basal cell cancer  2013 & 2015   Dr Lorrayne Helga   CARDIAC CATHETERIZATION N/A 10/30/2015   Procedure: Left Heart Cath and Coronary Angiography;  Surgeon: Lonni JONETTA Cash, MD;  Location: [facility] INVASIVE CV LAB;  Service: Cardiovascular;  Laterality: N/A;   COLONOSCOPY  2014   negative   COLONOSCOPY W/ POLYPECTOMY  2004   @ age 34, Dr Luis. ? Hyperplastic polyp   FLEXIBLE SIGMOIDOSCOPY  1999   negative   KNEE ARTHROSCOPY  1984   right    Allergies  Allergen Reactions   Codeine Anaphylaxis    Mental status changes  Other Reaction(s): Psychosis   Famotidine  Shortness Of Breath   Rosuvastatin  Calcium      Other Reaction(s): Other (See Comments)  Severe fatigue   Niacin Other (See Comments)    flushing    BP 120/80   Ht 5' 10 (1.778 m)   Wt 142 lb (64.4 kg)   BMI 20.37 kg/m      08/11/2020    9:16 AM 05/30/2021   10:19 AM 11/21/2021   11:31 AM  [facility] Adult Exercise  Frequency of aerobic exercise (# of days/week) 5 3 5   Average time in minutes 60 60 60  Frequency of strengthening activities (# of days/week) 2 2 1         No data to display              Objective:  Physical Exam:  Gen: NAD, comfortable in exam room  Bilateral knees: No gross deformity, ecchymoses, swelling. No TTP joint lines. Lacks about 5 degrees extension bilaterally. Flexion to 120 bilaterally. Normal strength. Negative ant/post drawers. Negative valgus/varus  testing. Negative lachman.  Negative mcmurrays, apleys.  NV intact distally. Assessment & Plan Bilateral knee osteoarthritis Previous cortisone injections provided temporary relief but are not recommended currently - main issues are lack of extension and occasional catching. Physical therapy suggested to improve quadriceps strength and knee extension. - Referred to physical therapy for quadriceps strengthening and knee extension exercises. - Advised against cortisone injections unless pain becomes severe. - OTC medications if needed.

## 2024-09-29 ENCOUNTER — Ambulatory Visit: Admitting: Physician Assistant

## 2024-09-29 ENCOUNTER — Encounter: Payer: Self-pay | Admitting: Physician Assistant

## 2024-09-29 VITALS — BP 100/62 | HR 83 | Resp 20 | Wt 145.0 lb

## 2024-09-29 DIAGNOSIS — R413 Other amnesia: Secondary | ICD-10-CM

## 2024-09-29 MED ORDER — MEMANTINE HCL 10 MG PO TABS: ORAL_TABLET | ORAL | 3 refills | Status: AC

## 2024-09-29 NOTE — Patient Instructions (Addendum)
 It was a pleasure to see you today at our office.   Recommendations:  Replenish the B12 Continue Memantine  20 mg at night  Follow up in   6 months Recommend visiting the website :  Dementia [facility] to better understand some behaviors related to memory loss.   For guidance regarding [facility] and if placement were needed at the facility, contact Social Worker tel: [phone]  For assessment of decision of mental capacity and competency:  Call Dr. Rosaline Nine, geriatric psychiatrist at [phone]  Counseling regarding caregiver distress, including caregiver depression, anxiety and issues regarding community resources, adult day care programs, adult living facilities, or memory care questions:  please contact your  Primary Doctor's Social Worker   Whom to call: Memory  decline, memory medications: Call our office [phone]    [email or web address]   RECOMMENDATIONS FOR ALL PATIENTS WITH MEMORY PROBLEMS: 1. Continue to exercise (Recommend 30 minutes of walking everyday, or 3 hours every week) 2. Increase social interactions - continue going to [place] and enjoy social gatherings with friends and family 3. Eat healthy, avoid fried foods and eat more fruits and vegetables 4. Maintain adequate blood pressure, blood sugar, and blood cholesterol level. Reducing the risk of stroke and cardiovascular disease also helps promoting better memory. 5. Avoid stressful situations. Live a simple life and avoid aggravations. Organize your time and prepare for the next day in anticipation. 6. Sleep well, avoid any interruptions of sleep and avoid any distractions in the bedroom that may interfere with adequate sleep quality 7. Avoid sugar, avoid sweets as there is a strong link between excessive sugar intake, diabetes, and cognitive impairment We discussed the Mediterranean diet, which has been shown to help patients  reduce the risk of progressive memory disorders and reduces cardiovascular risk. This includes eating fish, eat fruits and green leafy vegetables, nuts like almonds and hazelnuts, walnuts, and also use olive oil. Avoid fast foods and fried foods as much as possible. Avoid sweets and sugar as sugar use has been linked to worsening of memory function.  There is always a concern of gradual progression of memory problems. If this is the case, then we may need to adjust level of care according to patient needs. Support, both to the patient and caregiver, should then be put into place.        DRIVING: Regarding driving, in patients with progressive memory problems, driving will be impaired. We advise to have someone else do the driving if trouble finding directions or if minor accidents are reported. Independent driving assessment is available to determine safety of driving.   If you are interested in the driving assessment, you can contact the following:  [employer] in [place] [phone]  [facility] [phone]  [facility] [phone]  [facility] [phone] or [phone]   FALL PRECAUTIONS: Be cautious when walking. Scan the area for obstacles that may increase the risk of trips and falls. When getting up in the mornings, sit up at the edge of the bed for a few minutes before getting out of bed. Consider elevating the bed at the head end to avoid drop of blood pressure when getting up. Walk always in a well-lit room (use night lights in the walls). Avoid area rugs or power cords from appliances in the middle of the walkways. Use a walker or a cane if necessary and consider physical therapy for balance exercise. Get your eyesight checked regularly.  FINANCIAL OVERSIGHT: Supervision, especially oversight when  making financial decisions or transactions is also recommended.  HOME SAFETY: Consider the safety of the kitchen when operating  appliances like stoves, microwave oven, and blender. Consider having supervision and share cooking responsibilities until no longer able to participate in those. Accidents with firearms and other hazards in the house should be identified and addressed as well.   ABILITY TO BE LEFT ALONE: If patient is unable to contact 911 operator, consider using LifeLine, or when the need is there, arrange for someone to stay with patients. Smoking is a fire hazard, consider supervision or cessation. Risk of wandering should be assessed by caregiver and if detected at any point, supervision and safe proof recommendations should be instituted.  MEDICATION SUPERVISION: Inability to self-administer medication needs to be constantly addressed. Implement a mechanism to ensure safe administration of the medications.      Mediterranean Diet A Mediterranean diet refers to food and lifestyle choices that are based on the traditions of countries located on the [employer]. This way of eating has been shown to help prevent certain conditions and improve outcomes for people who have chronic diseases, like kidney disease and heart disease. What are tips for following this plan? Lifestyle  Cook and eat meals together with your family, when possible. Drink enough fluid to keep your urine clear or pale yellow. Be physically active every day. This includes: Aerobic exercise like running or swimming. Leisure activities like gardening, walking, or housework. Get 7-8 hours of sleep each night. If recommended by your health care provider, drink red wine in moderation. This means 1 glass a day for nonpregnant women and 2 glasses a day for men. A glass of wine equals 5 oz (150 mL). Reading food labels  Check the serving size of packaged foods. For foods such as rice and pasta, the serving size refers to the amount of cooked product, not dry. Check the total fat in packaged foods. Avoid foods that have saturated fat or trans  fats. Check the ingredients list for added sugars, such as corn syrup. Shopping  At the grocery store, buy most of your food from the areas near the walls of the store. This includes: Fresh fruits and vegetables (produce). Grains, beans, nuts, and seeds. Some of these may be available in unpackaged forms or large amounts (in bulk). Fresh seafood. Poultry and eggs. Low-fat dairy products. Buy whole ingredients instead of prepackaged foods. Buy fresh fruits and vegetables in-season from local farmers markets. Buy frozen fruits and vegetables in resealable bags. If you do not have access to quality fresh seafood, buy precooked frozen shrimp or canned fish, such as tuna, salmon, or sardines. Buy small amounts of raw or cooked vegetables, salads, or olives from the deli or salad bar at your store. Stock your pantry so you always have certain foods on hand, such as olive oil, canned tuna, canned tomatoes, rice, pasta, and beans. Cooking  Cook foods with extra-virgin olive oil instead of using butter or other vegetable oils. Have meat as a side dish, and have vegetables or grains as your main dish. This means having meat in small portions or adding small amounts of meat to foods like pasta or stew. Use beans or vegetables instead of meat in common dishes like chili or lasagna. Experiment with different cooking methods. Try roasting or broiling vegetables instead of steaming or sauteing them. Add frozen vegetables to soups, stews, pasta, or rice. Add nuts or seeds for added healthy fat at each meal. You can add these to  yogurt, salads, or vegetable dishes. Marinate fish or vegetables using olive oil, lemon juice, garlic, and fresh herbs. Meal planning  Plan to eat 1 vegetarian meal one day each week. Try to work up to 2 vegetarian meals, if possible. Eat seafood 2 or more times a week. Have healthy snacks readily available, such as: Vegetable sticks with hummus. Greek yogurt. Fruit and nut  trail mix. Eat balanced meals throughout the week. This includes: Fruit: 2-3 servings a day Vegetables: 4-5 servings a day Low-fat dairy: 2 servings a day Fish, poultry, or lean meat: 1 serving a day Beans and legumes: 2 or more servings a week Nuts and seeds: 1-2 servings a day Whole grains: 6-8 servings a day Extra-virgin olive oil: 3-4 servings a day Limit red meat and sweets to only a few servings a month What are my food choices? Mediterranean diet Recommended Grains: Whole-grain pasta. Brown rice. Bulgar wheat. Polenta. Couscous. Whole-wheat bread. [place] Madeira. Vegetables: Artichokes. Beets. Broccoli. Cabbage. Carrots. Eggplant. Green beans. Chard. Kale. Spinach. Onions. Leeks. Peas. Squash. Tomatoes. Peppers. Radishes. Fruits: Apples. Apricots. Avocado. Berries. Bananas. Cherries. Dates. Figs. Grapes. Lemons. Melon. Oranges. Peaches. Plums. Pomegranate. Meats and other protein foods: Beans. Almonds. Sunflower seeds. Pine nuts. Peanuts. Cod. Salmon. Scallops. Shrimp. Tuna. Tilapia. Clams. Oysters. Eggs. Dairy: Low-fat milk. Cheese. Greek yogurt. Beverages: Water. Red wine. Herbal tea. Fats and oils: Extra virgin olive oil. Avocado oil. Grape seed oil. Sweets and desserts: Greek yogurt with honey. Baked apples. Poached pears. Trail mix. Seasoning and other foods: Basil. Cilantro. Coriander. Cumin. Mint. Parsley. Sage. Rosemary. Tarragon. Garlic. Oregano. Thyme. Pepper. Balsalmic vinegar. Tahini. Hummus. Tomato sauce. Olives. Mushrooms. Limit these Grains: Prepackaged pasta or rice dishes. Prepackaged cereal with added sugar. Vegetables: Deep fried potatoes (french fries). Fruits: Fruit canned in syrup. Meats and other protein foods: Beef. Pork. Lamb. Poultry with skin. Hot dogs. [place]. Dairy: Ice cream. Sour cream. Whole milk. Beverages: Juice. Sugar-sweetened soft drinks. Beer. Liquor and spirits. Fats and oils: Butter. Canola oil. Vegetable oil. Beef fat (tallow).  Lard. Sweets and desserts: Cookies. Cakes. Pies. Candy. Seasoning and other foods: Mayonnaise. Premade sauces and marinades. The items listed may not be a complete list. Talk with your dietitian about what dietary choices are right for you. Summary The Mediterranean diet includes both food and lifestyle choices. Eat a variety of fresh fruits and vegetables, beans, nuts, seeds, and whole grains. Limit the amount of red meat and sweets that you eat. Talk with your health care provider about whether it is safe for you to drink red wine in moderation. This means 1 glass a day for nonpregnant women and 2 glasses a day for men. A glass of wine equals 5 oz (150 mL). This information is not intended to replace advice given to you by your health care provider. Make sure you discuss any questions you have with your health care provider. Document Released: 06/07/2016 Document Revised: 07/10/2016 Document Reviewed: 06/07/2016 Elsevier Interactive Patient Education  2017 [employer].

## 2024-09-29 NOTE — Progress Notes (Signed)
 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 No significant personality changes or depression. Occasional disorientation and forgetfulness, such as losing keys. No hallucinations or significant wandering behavior. Continues to drive safely after passing a DMV test. No recent falls or head injuries. No vision changes or urinary incontinence. Continues to engage in physical activities like [facility] and [facility]. Caregiver distress reported, and caregiver support resources discussed.  - Discontinued donepezil  due to excessive sleepiness. - Continue memantine , increase to 20 mg at night if tolerated; if not, maintain at 15 mg. - Encouraged continued physical activity and engagement in enjoyable activities. - Discussed caregiver support resources and potential for home health assistance.         Discussed the use of AI scribe software for clinical note transcription with the patient, who gave verbal consent to proceed.  History of Present Illness   Trevor [name] is a very pleasant 78 y.o. RH male with a history of dementia likely due to mixed Alzheimer's and vascular disease seen today in follow up for memory loss. Patient is currently on memantine  15 mg at night . He previously tried donepezil  but found it caused excessive sleepiness, leading to its discontinuation. He takes vitamin B12 regularly. This patient is accompanied in the office by his wife who supplements the history.  Previous records as well as any outside records available were reviewed prior to todays visit. Patient was last seen on 03/24/24. Cognitive decline is noted.  Patient is able to participate on ADLs and continues to drive after passing the [facility] test without difficulties. Mood is anxious.    He experiences a persistent 'fog' and feels 'weird.' There has been a decline in his running and  walking activities since January, following an illness, and his physical activity is not the same as before.    He has difficulties with short-term memory, such as losing his keys, but no significant issues with long-term memory, like recognizing family members. He does not feel disoriented at home and does not exhibit wandering behavior. No frequent repetition of questions to his wife and maintains awareness of his surroundings.  His wife manages his medications and finances, and he does not require reminders for personal hygiene. He acknowledges occasional forgetfulness, such as misplacing car keys, but attributes this to past behavior as well.  He experiences occasional hallucinations, having once thought someone was present and talking to him. His wife reports hearing him talk in his sleep and notes he sometimes grunts or turns over at night. No nightmares or vivid dreams.   No recent falls, head injuries, or symptoms of stroke. No changes in vision, headaches, or urinary incontinence. He maintains his ability to walk without difficulty and continues to engage in physical activities at the [facility].         09/22/2018   11:31 AM 09/11/2017    8:58 AM  MMSE - Mini Mental State Exam  Not completed: -- --      01/08/2024    1:00 PM  Montreal Cognitive Assessment   Visuospatial/ Executive (0/5) 0  Naming (0/3) 0  Attention: Read list of digits (0/2) 0  Attention: Read list of letters (0/1) 1  Attention: Serial 7 subtraction starting at 100 (0/3) 0  Language: Repeat phrase (0/2) 1  Language : Fluency (0/1) 0  Abstraction (0/2) 0  Delayed Recall (0/5) 0  Orientation (0/6) 1  Total 3  Adjusted Score (based on education) 3    Results DIAGNOSTIC Visual acuity test: Passed    Objective:    Neurological Exam:    VITALS:   Vitals:   09/29/24 1434  BP: 100/62  Pulse: 83  Resp: 20  SpO2: 97%  Weight: 145 lb (65.8 kg)    GEN:  The patient appears stated age and is in  NAD. HEENT:  Normocephalic, atraumatic.   Neurological examination:  General: NAD, well-groomed, appears stated age. Orientation: The patient is alert. Oriented to person, not to place and date Cranial nerves: There is good facial symmetry.The speech is fluent and clear. No aphasia or dysarthria. Fund of knowledge is reduced. Recent and remote memory are impaired. Attention and concentration are reduced. Able to name objects and repeat phrases.  Hearing is intact to conversational tone.  Sensation: Sensation is intact to light touch throughout Motor: Strength is at least antigravity x4. DTR's 2/4 in UE/LE     Movement examination:  Tone: There is normal tone in the UE/LE Abnormal movements:  no tremor.  No myoclonus.  No asterixis.   Coordination:  There is no decremation with RAM's. Normal finger to nose  Gait and Station: The patient has no difficulty arising out of a deep-seated chair without the use of the hands. The patient's stride length is good.  Gait is cautious and narrow.    Thank you for allowing us  the opportunity to participate in the care of this nice patient. Please do not hesitate to contact us  for any questions or concerns.   Total time spent on today's visit was 34 minutes dedicated to this patient today, preparing to see patient, examining the patient, ordering tests and/or medications and counseling the patient, documenting clinical information in the EHR or other health record, independently interpreting results and communicating results to the patient/family, discussing treatment and goals, answering patient's questions and coordinating care.  Cc:  Kennyth Worth HERO, MD  Camie Sevin 09/29/2024 5:50 PM

## 2024-10-08 ENCOUNTER — Ambulatory Visit: Payer: Medicare Other | Admitting: Family Medicine

## 2024-10-08 VITALS — BP 110/60 | HR 57 | Temp 97.2°F | Ht 70.0 in | Wt 145.8 lb

## 2024-10-08 DIAGNOSIS — R739 Hyperglycemia, unspecified: Secondary | ICD-10-CM | POA: Diagnosis not present

## 2024-10-08 DIAGNOSIS — E785 Hyperlipidemia, unspecified: Secondary | ICD-10-CM | POA: Diagnosis not present

## 2024-10-08 DIAGNOSIS — N400 Enlarged prostate without lower urinary tract symptoms: Secondary | ICD-10-CM

## 2024-10-08 DIAGNOSIS — Z0001 Encounter for general adult medical examination with abnormal findings: Secondary | ICD-10-CM

## 2024-10-08 DIAGNOSIS — K219 Gastro-esophageal reflux disease without esophagitis: Secondary | ICD-10-CM | POA: Diagnosis not present

## 2024-10-08 DIAGNOSIS — G309 Alzheimer's disease, unspecified: Secondary | ICD-10-CM | POA: Diagnosis not present

## 2024-10-08 DIAGNOSIS — E559 Vitamin D deficiency, unspecified: Secondary | ICD-10-CM | POA: Diagnosis not present

## 2024-10-08 DIAGNOSIS — R972 Elevated prostate specific antigen [PSA]: Secondary | ICD-10-CM | POA: Diagnosis not present

## 2024-10-08 DIAGNOSIS — F0280 Dementia in other diseases classified elsewhere without behavioral disturbance: Secondary | ICD-10-CM | POA: Diagnosis not present

## 2024-10-08 LAB — LIPID PANEL
Cholesterol: 194 mg/dL (ref 0–200)
HDL: 45.2 mg/dL (ref >39.00)
LDL Cholesterol: 132 mg/dL — ABNORMAL HIGH (ref 0–99)
NonHDL: 148.59
Total CHOL/HDL Ratio: 4
Triglycerides: 84 mg/dL (ref 0.0–149.0)
VLDL: 16.8 mg/dL (ref 0.0–40.0)

## 2024-10-08 LAB — CBC
HCT: 36.2 % — ABNORMAL LOW (ref 39.0–52.0)
Hemoglobin: 12.6 g/dL — ABNORMAL LOW (ref 13.0–17.0)
MCHC: 34.8 g/dL (ref 30.0–36.0)
MCV: 91.3 fl (ref 78.0–100.0)
Platelets: 207 K/uL (ref 150.0–400.0)
RBC: 3.96 Mil/uL — ABNORMAL LOW (ref 4.22–5.81)
RDW: 13.3 % (ref 11.5–15.5)
WBC: 5.3 K/uL (ref 4.0–10.5)

## 2024-10-08 LAB — COMPREHENSIVE METABOLIC PANEL WITH GFR
ALT: 17 U/L (ref 0–53)
AST: 28 U/L (ref 0–37)
Albumin: 4.2 g/dL (ref 3.5–5.2)
Alkaline Phosphatase: 53 U/L (ref 39–117)
BUN: 18 mg/dL (ref 6–23)
CO2: 29 meq/L (ref 19–32)
Calcium: 8.9 mg/dL (ref 8.4–10.5)
Chloride: 100 meq/L (ref 96–112)
Creatinine, Ser: 0.97 mg/dL (ref 0.40–1.50)
GFR: 75.72 mL/min (ref >60.00)
Glucose, Bld: 85 mg/dL (ref 70–99)
Potassium: 4.6 meq/L (ref 3.5–5.1)
Sodium: 135 meq/L (ref 135–145)
Total Bilirubin: 1.3 mg/dL — ABNORMAL HIGH (ref 0.2–1.2)
Total Protein: 6.4 g/dL (ref 6.0–8.3)

## 2024-10-08 LAB — VITAMIN D 25 HYDROXY (VIT D DEFICIENCY, FRACTURES): VITD: 52.52 ng/mL (ref 30.00–100.00)

## 2024-10-08 LAB — HEMOGLOBIN A1C: Hgb A1c MFr Bld: 5.8 % (ref 4.6–6.5)

## 2024-10-08 LAB — TSH: TSH: 5.87 u[IU]/mL — ABNORMAL HIGH (ref 0.35–5.50)

## 2024-10-08 LAB — PSA: PSA: 2.07 ng/mL (ref 0.10–4.00)

## 2024-10-08 NOTE — Progress Notes (Signed)
 Chief Complaint:  Trevor [name] is a 78 y.o. male who presents today for his annual comprehensive physical exam.    Assessment/Plan:  Chronic Problems Addressed Today: Hyperlipidemia with target LDL less than 70 Check lipids.  Was previously on Crestor  5 mg 3 times weekly however is no longer on th may need to restart depending on results of today's lipid panel.is.   Hyperglycemia Check A1c with labs.  Vitamin D  deficiency Check vitamin D  with labs.  Elevated PSA Check PSA  BPH without obstruction/lower urinary tract symptoms Overall symptoms are stable.  Check PSA.  Dementia [facility]) Continue management per neurology.  He is on Namenda  10 mg twice daily.  Overall symptoms are stable.  He is still extremely active and independent in the community.  Gastroesophageal reflux disease without esophagitis No longer on Protonix .  Symptoms are currently not bothersome.  Preventative Healthcare: Check labs.  Flu vaccine declined.    Patient Counseling(The following topics were reviewed and/or handout was given):  -Nutrition: Stressed importance of moderation in sodium/caffeine intake, saturated fat and cholesterol, caloric balance, sufficient intake of fresh fruits, vegetables, and fiber.  -Stressed the importance of regular exercise.   -Substance Abuse: Discussed cessation/primary prevention of tobacco, alcohol, or other drug use; driving or other dangerous activities under the influence; availability of treatment for abuse.   -Injury prevention: Discussed safety belts, safety helmets, smoke detector, smoking near bedding or upholstery.   -Sexuality: Discussed sexually transmitted diseases, partner selection, use of condoms, avoidance of unintended pregnancy and contraceptive alternatives.   -Dental health: Discussed importance of regular tooth brushing, flossing, and dental visits.  -Health maintenance and immunizations reviewed. Please refer to Health maintenance  section.  Return to care in 1 year for next preventative visit.     Subjective:  HPI:  He has no acute complaints today. Patient is here today for his annual physical.  See assessment / plan for status of chronic conditions.  Discussed the use of AI scribe software for clinical note transcription with the patient, who gave verbal consent to proceed.  History of Present Illness Trevor [name] is a 78 year old male who presents for an annual physical exam.  He is maintaining an active lifestyle with regular running and exercise, which he finds beneficial. He continues to work and stay active.  He is no longer taking pantoprazole  for acid reflux.  He has not received a flu shot yet.         10/08/2024   11:01 AM  Depression screen PHQ 2/9  Decreased Interest 0  Down, Depressed, Hopeless 0  PHQ - 2 Score 0    There are no preventive care reminders to display for this patient.   ROS: Per HPI, otherwise a complete review of systems was negative.   PMH:  The following were reviewed and entered/updated in epic: Past Medical History:  Diagnosis Date   Acute pericarditis    a. admitted as a code STEMI but no sig CAD on cath.    CAD (coronary artery disease)    30% stenosis mid RCA   Colonic polyp    Dr. Luis; hyperplastic polyp   Bertrum syndrome    Hyperlipidemia    Patient Active Problem List   Diagnosis Date Noted   Sleep-disordered breathing 02/20/2024   Dementia (HCC) 01/03/2024   Visual field defect 10/07/2023   Elevated PSA 10/07/2023   Vitamin D  deficiency 10/05/2022   Hyperglycemia 09/20/2020   Gastroesophageal reflux disease without esophagitis 09/03/2016  Exercise-induced asthma 03/14/2016   CAD (coronary artery disease)    Bertrum syndrome 09/06/2014   History of basal cell cancer 08/31/2013   Hyperplastic colonic polyp 09/09/2008   Hyperlipidemia with target LDL less than 70 09/01/2007   BPH without obstruction/lower urinary tract  symptoms 09/01/2007   Past Surgical History:  Procedure Laterality Date   actinic keratoses     facial removed x 2, Dr Helga   basal cell cancer  2013 & 2015   Dr Lorrayne Helga   CARDIAC CATHETERIZATION N/A 10/30/2015   Procedure: Left Heart Cath and Coronary Angiography;  Surgeon: Lonni JONETTA Cash, MD;  Location: [facility] INVASIVE CV LAB;  Service: Cardiovascular;  Laterality: N/A;   COLONOSCOPY  2014   negative   COLONOSCOPY W/ POLYPECTOMY  2004   @ age 89, Dr Luis. ? Hyperplastic polyp   FLEXIBLE SIGMOIDOSCOPY  1999   negative   KNEE ARTHROSCOPY  1984   right    Family History  Problem Relation Age of Onset   Stroke Father 22   Alcohol abuse Father    Asthma Brother    Breast cancer Mother    HIV Brother    Prostate cancer Brother 73   Breast cancer Maternal Aunt    Breast cancer Maternal Grandmother    Heart disease Neg Hx    Diabetes Neg Hx     Medications- reviewed and updated Current Outpatient Medications  Medication Sig Dispense Refill   Ascorbic Acid  (VITA-C PO) Take 1 tablet by mouth daily.     aspirin  81 MG tablet Take 81 mg by mouth daily.       Cholecalciferol  (VITAMIN D ) 2000 UNITS CAPS Take 2,000 Units by mouth daily.      fish oil-omega-3 fatty acids  1000 MG capsule Take 1 g by mouth daily.      Magnesium  250 MG TABS Take 250 mg by mouth daily.      memantine  (NAMENDA ) 10 MG tablet Take 1 tablet  twice a day 180 tablet 3   Multiple Vitamins-Minerals (MENS 50+ MULTI VITAMIN/MIN PO) Take 1 tablet daily by mouth.     Turmeric (QC TUMERIC COMPLEX PO) Take by mouth.     Zinc 25 MG TABS Take 1 tablet by mouth daily.     No current facility-administered medications for this visit.    Allergies-reviewed and updated Allergies[1]  Social History   Socioeconomic History   Marital status: Married    Spouse name: Not on file   Number of children: 0   Years of education: 16   Highest education level: Not on file  Occupational History   Occupation:  retired   Tobacco Use   Smoking status: Former    Current packs/day: 0.00    Average packs/day: 1 pack/day for 13.0 years (13.0 ttl pk-yrs)    Types: Cigarettes    Start date: 10/29/1965    Quit date: 10/29/1978    Years since quitting: 45.9   Smokeless tobacco: Never   Tobacco comments:    smoked 1967-1980, up to 1 ppd  Vaping Use   Vaping status: Never Used  Substance and Sexual Activity   Alcohol use: No    Alcohol/week: 0.0 standard drinks of alcohol   Drug use: No   Sexual activity: Not on file  Other Topics Concern   Not on file  Social History Narrative   Originally from [place]. Moved to [place] in 1951. He moved to [place] in 1984. 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 Previously has traveled to [place], [place], [place], [place], [place], [place] , and [place]. Normally has a cockatiel but not currently No mold exposure. Has a hot tub but doesn't use it regularly. No children   Right handed    Social Drivers of Health   Tobacco Use: Medium Risk (09/29/2024)   Patient History    Smoking Tobacco Use: Former    Smokeless Tobacco Use: Never    Passive Exposure: Not on file  Financial Resource Strain: Low Risk (01/06/2024)   Overall Financial Resource Strain (CARDIA)    Difficulty of Paying Living Expenses: Not hard at all  Food Insecurity: No Food Insecurity (01/06/2024)   Hunger Vital Sign    Worried About Running Out of Food in the Last Year: Never true    Ran Out of Food in the Last Year: Never true  Transportation Needs: No Transportation Needs (01/06/2024)   PRAPARE - Administrator, Civil Service (Medical): No    Lack of Transportation (Non-Medical): No  Physical Activity: Sufficiently Active (01/06/2024)   Exercise Vital Sign    Days of Exercise per Week: 5 days    Minutes of Exercise per Session: 90 min  Stress: No Stress Concern Present (01/06/2024)   [employer] of Occupational Health - Occupational Stress  Questionnaire    Feeling of Stress : Not at all  Social Connections: Socially Integrated (01/06/2024)   Social Connection and Isolation Panel    Frequency of Communication with Friends and Family: More than three times a week    Frequency of Social Gatherings with Friends and Family: More than three times a week    Attends Religious Services: More than 4 times per year    Active Member of [employer] or Organizations: Yes    Attends Banker Meetings: 1 to 4 times per year    Marital Status: Married  Depression (PHQ2-9): Low Risk (10/08/2024)   Depression (PHQ2-9)    PHQ-2 Score: 0  Alcohol Screen: Not on file  Housing: Unknown (01/06/2024)   Housing Stability Vital Sign    Unable to Pay for Housing in the Last Year: No    Number of Times Moved in the Last Year: Not on file    Homeless in the Last Year: No  Utilities: Not At Risk (01/06/2024)   AHC Utilities    Threatened with loss of utilities: No  Health Literacy: Adequate Health Literacy (01/06/2024)   B1300 Health Literacy    Frequency of need for help with medical instructions: Never        Objective:  Physical Exam: BP 110/60   Pulse (!) 57   Temp (!) 97.2 F (36.2 C) (Temporal)   Ht 5' 10 (1.778 m)   Wt 145 lb 12.8 oz (66.1 kg)   SpO2 94%   BMI 20.92 kg/m   Body mass index is 20.92 kg/m. Wt Readings from Last 3 Encounters:  10/08/24 145 lb 12.8 oz (66.1 kg)  09/29/24 145 lb (65.8 kg)  09/07/24 142 lb (64.4 kg)   Gen: NAD, resting comfortably HEENT: TMs normal bilaterally. OP clear. No thyromegaly noted.  CV: RRR with no murmurs appreciated Pulm: NWOB, CTAB with no crackles, wheezes, or rhonchi GI: Normal bowel sounds present. Soft, Nontender, Nondistended. MSK: no edema, cyanosis, or clubbing noted Skin: warm, dry Neuro: CN2-12 grossly intact. Strength 5/5 in upper and lower extremities. Reflexes symmetric and intact bilaterally.  Psych: Normal affect and thought content     Trevor [name] M. Kennyth,  MD 10/08/2024 11:22 AM     [1]  Allergies Allergen Reactions   Codeine Anaphylaxis    Mental status changes  Other Reaction(s): Psychosis   Famotidine  Shortness Of Breath   Rosuvastatin  Calcium      Other Reaction(s): Other (See Comments)  Severe fatigue   Niacin Other (See Comments)    flushing

## 2024-10-08 NOTE — Assessment & Plan Note (Signed)
 Overall symptoms are stable.  Check PSA.

## 2024-10-08 NOTE — Patient Instructions (Signed)
 It was very nice to see you today!  VISIT SUMMARY: Today, you came in for your annual physical exam. You are staying active with regular running and exercise, and you continue to work and stay engaged. We discussed your past ear incident and your confidence in driving. You have stopped taking pantoprazole  for acid reflux and have not yet received your flu shot.  YOUR PLAN: ALZHEIMER'S DISEASE: Your Alzheimer's disease is well-managed with no significant decline in your daily activities. You are maintaining your independence, including driving. -Continue with your current management and lifestyle activities.  GASTROESOPHAGEAL REFLUX DISEASE: You have stopped taking pantoprazole  for your acid reflux. -No further action is needed at this time.  GENERAL HEALTH MAINTENANCE: We discussed your general health maintenance, including blood work and vaccinations. -Blood work has been ordered. -You declined the flu shot today but plan to receive it soon. -No need for colon cancer screening as you are over 26 years old. -Please get your flu vaccination soon.  Return in about 1 year (around 10/08/2025).   Take care, Dr Kennyth  PLEASE NOTE:  If you had any lab tests, please let us  know if you have not heard back within a few days. You may see your results on mychart before we have a chance to review them but we will give you a call once they are reviewed by us .   If we ordered any referrals today, please let us  know if you have not heard from their office within the next week.   If you had any urgent prescriptions sent in today, please check with the pharmacy within an hour of our visit to make sure the prescription was transmitted appropriately.   Please try these tips to maintain a healthy lifestyle:  Eat at least 3 REAL meals and 1-2 snacks per day.  Aim for no more than 5 hours between eating.  If you eat breakfast, please do so within one hour of getting up.   Each meal should contain half  fruits/vegetables, one quarter protein, and one quarter carbs (no bigger than a computer mouse)  Cut down on sweet beverages. This includes juice, soda, and sweet tea.   Drink at least 1 glass of water with each meal and aim for at least 8 glasses per day  Exercise at least 150 minutes every week.    Preventive Care 28 Years and Older, Male Preventive care refers to lifestyle choices and visits with your health care provider that can promote health and wellness. Preventive care visits are also called wellness exams. What can I expect for my preventive care visit? Counseling During your preventive care visit, your health care provider may ask about your: Medical history, including: Past medical problems. Family medical history. History of falls. Current health, including: Emotional well-being. Home life and relationship well-being. Sexual activity. Memory and ability to understand (cognition). Lifestyle, including: Alcohol, nicotine or tobacco, and drug use. Access to firearms. Diet, exercise, and sleep habits. Work and work astronomer. Sunscreen use. Safety issues such as seatbelt and bike helmet use. Physical exam Your health care provider will check your: Height and weight. These may be used to calculate your BMI (body mass index). BMI is a measurement that tells if you are at a healthy weight. Waist circumference. This measures the distance around your waistline. This measurement also tells if you are at a healthy weight and may help predict your risk of certain diseases, such as type 2 diabetes and high blood pressure. Heart rate and blood  pressure. Body temperature. Skin for abnormal spots. What immunizations do I need?  Vaccines are usually given at various ages, according to a schedule. Your health care provider will recommend vaccines for you based on your age, medical history, and lifestyle or other factors, such as travel or where you work. What tests do I  need? Screening Your health care provider may recommend screening tests for certain conditions. This may include: Lipid and cholesterol levels. Diabetes screening. This is done by checking your blood sugar (glucose) after you have not eaten for a while (fasting). Hepatitis C test. Hepatitis B test. HIV (human immunodeficiency virus) test. STI (sexually transmitted infection) testing, if you are at risk. Lung cancer screening. Colorectal cancer screening. Prostate cancer screening. Abdominal aortic aneurysm (AAA) screening. You may need this if you are a current or former smoker. Talk with your health care provider about your test results, treatment options, and if necessary, the need for more tests. Follow these instructions at home: Eating and drinking  Eat a diet that includes fresh fruits and vegetables, whole grains, lean protein, and low-fat dairy products. Limit your intake of foods with high amounts of sugar, saturated fats, and salt. Take vitamin and mineral supplements as recommended by your health care provider. Do not drink alcohol if your health care provider tells you not to drink. If you drink alcohol: Limit how much you have to 0-2 drinks a day. Know how much alcohol is in your drink. In the [place] one drink equals one 12 oz bottle of beer (355 mL), one 5 oz glass of wine (148 mL), or one 1 oz glass of hard liquor (44 mL). Lifestyle Brush your teeth every morning and night with fluoride toothpaste. Floss one time each day. Exercise for at least 30 minutes 5 or more days each week. Do not use any products that contain nicotine or tobacco. These products include cigarettes, chewing tobacco, and vaping devices, such as e-cigarettes. If you need help quitting, ask your health care provider. Do not use drugs. If you are sexually active, practice safe sex. Use a condom or other form of protection to prevent STIs. Take aspirin  only as told by your health care provider. Make sure  that you understand how much to take and what form to take. Work with your health care provider to find out whether it is safe and beneficial for you to take aspirin  daily. Ask your health care provider if you need to take a cholesterol-lowering medicine (statin). Find healthy ways to manage stress, such as: Meditation, yoga, or listening to music. Journaling. Talking to a trusted person. Spending time with friends and family. Safety Always wear your seat belt while driving or riding in a vehicle. Do not drive: If you have been drinking alcohol. Do not ride with someone who has been drinking. When you are tired or distracted. While texting. If you have been using any mind-altering substances or drugs. Wear a helmet and other protective equipment during sports activities. If you have firearms in your house, make sure you follow all gun safety procedures. Minimize exposure to UV radiation to reduce your risk of skin cancer. What's next? Visit your health care provider once a year for an annual wellness visit. Ask your health care provider how often you should have your eyes and teeth checked. Stay up to date on all vaccines. This information is not intended to replace advice given to you by your health care provider. Make sure you discuss any questions you have with  your health care provider. Document Revised: 04/12/2021 Document Reviewed: 04/12/2021 Elsevier Patient Education  2024 [employer].

## 2024-10-08 NOTE — Assessment & Plan Note (Signed)
 Check lipids.  Was previously on Crestor  5 mg 3 times weekly however is no longer on th may need to restart depending on results of today's lipid panel.is.

## 2024-10-08 NOTE — Assessment & Plan Note (Signed)
 Check PSA. ?

## 2024-10-08 NOTE — Assessment & Plan Note (Signed)
 Continue management per neurology.  He is on Namenda  10 mg twice daily.  Overall symptoms are stable.  He is still extremely active and independent in the community.

## 2024-10-08 NOTE — Assessment & Plan Note (Signed)
Check vitamin D with labs.

## 2024-10-08 NOTE — Assessment & Plan Note (Signed)
 No longer on Protonix .  Symptoms are currently not bothersome.

## 2024-10-08 NOTE — Assessment & Plan Note (Signed)
Check A1c with labs. 

## 2024-10-09 ENCOUNTER — Ambulatory Visit: Payer: Self-pay | Admitting: Family Medicine

## 2024-10-09 DIAGNOSIS — E038 Other specified hypothyroidism: Secondary | ICD-10-CM

## 2024-10-09 NOTE — Progress Notes (Signed)
 Thyroid  levels are off a little bit.  Recommend he come back to recheck TSH, free T4, and free T3.  Cholesterol is also mildly elevated.  Recommend restarting Crestor  5 mg 3 times daily.  Please send in new prescription if he is agreeable.  We should recheck in 3 to 6 months.   all of his other labs are stable and we can recheck again in a year.

## 2024-10-14 ENCOUNTER — Telehealth: Payer: Self-pay | Admitting: *Deleted

## 2024-10-14 NOTE — Telephone Encounter (Signed)
 Patient  agrees in restarting Crestor  .please verified Sig

## 2024-10-15 ENCOUNTER — Other Ambulatory Visit (INDEPENDENT_AMBULATORY_CARE_PROVIDER_SITE_OTHER)

## 2024-10-15 DIAGNOSIS — E038 Other specified hypothyroidism: Secondary | ICD-10-CM | POA: Diagnosis not present

## 2024-10-15 LAB — TSH: TSH: 6.26 u[IU]/mL — ABNORMAL HIGH (ref 0.35–5.50)

## 2024-10-15 LAB — T3, FREE: T3, Free: 3.1 pg/mL (ref 2.3–4.2)

## 2024-10-15 LAB — T4, FREE: Free T4: 0.6 ng/dL (ref 0.60–1.60)

## 2024-10-15 NOTE — Telephone Encounter (Signed)
 Please see my previous result note. Crestor  5 mg three times weekly.

## 2024-10-16 ENCOUNTER — Ambulatory Visit: Payer: Self-pay | Admitting: Family Medicine

## 2024-10-16 ENCOUNTER — Other Ambulatory Visit: Payer: Self-pay | Admitting: *Deleted

## 2024-10-16 MED ORDER — ROSUVASTATIN CALCIUM 20 MG PO TABS: ORAL_TABLET | ORAL | 1 refills | Status: DC

## 2024-10-16 NOTE — Progress Notes (Signed)
 His thyroid  indicates that he has borderline low thyroid .  He may benefit from starting thyroid  replacement though I would like to see him in office to discuss this before starting.  Please have him schedule appointment soon.

## 2024-10-16 NOTE — Telephone Encounter (Signed)
 Rx send to pharmacy

## 2024-10-19 NOTE — Telephone Encounter (Signed)
 Please schedule a lab appt with PCP follow up lab results

## 2024-11-03 ENCOUNTER — Encounter: Payer: Self-pay | Admitting: Family Medicine

## 2024-11-03 ENCOUNTER — Ambulatory Visit (INDEPENDENT_AMBULATORY_CARE_PROVIDER_SITE_OTHER): Admitting: Family Medicine

## 2024-11-03 VITALS — BP 118/78 | HR 76 | Temp 98.0°F | Ht 70.0 in | Wt 146.0 lb

## 2024-11-03 DIAGNOSIS — F0280 Dementia in other diseases classified elsewhere without behavioral disturbance: Secondary | ICD-10-CM

## 2024-11-03 DIAGNOSIS — G309 Alzheimer's disease, unspecified: Secondary | ICD-10-CM | POA: Diagnosis not present

## 2024-11-03 DIAGNOSIS — E038 Other specified hypothyroidism: Secondary | ICD-10-CM | POA: Insufficient documentation

## 2024-11-03 DIAGNOSIS — E785 Hyperlipidemia, unspecified: Secondary | ICD-10-CM | POA: Diagnosis not present

## 2024-11-03 MED ORDER — LEVOTHYROXINE SODIUM 25 MCG PO TABS: 25.0000 ug | ORAL_TABLET | Freq: Every day | ORAL | 3 refills | Status: DC

## 2024-11-03 NOTE — Patient Instructions (Signed)
 It was very nice to see you today!  VISIT SUMMARY: You came in for a follow-up on your thyroid  function and discussed your memory issues and decreased energy levels. We also reviewed your cholesterol management.  YOUR PLAN: SUBCLINICAL HYPOTHYROIDISM: Your thyroid  function tests show borderline hypothyroidism, which might be affecting your memory and energy levels. -Start taking levothyroxine  25 mcg daily. -We will re-evaluate your thyroid  function in 6 weeks.  HYPERLIPIDEMIA: Your cholesterol levels are elevated. -Restart taking Crestor  as previously prescribed.  No follow-ups on file.   Take care, Dr Kennyth  PLEASE NOTE:  If you had any lab tests, please let us  know if you have not heard back within a few days. You may see your results on mychart before we have a chance to review them but we will give you a call once they are reviewed by us .   If we ordered any referrals today, please let us  know if you have not heard from their office within the next week.   If you had any urgent prescriptions sent in today, please check with the pharmacy within an hour of our visit to make sure the prescription was transmitted appropriately.   Please try these tips to maintain a healthy lifestyle:  Eat at least 3 REAL meals and 1-2 snacks per day.  Aim for no more than 5 hours between eating.  If you eat breakfast, please do so within one hour of getting up.   Each meal should contain half fruits/vegetables, one quarter protein, and one quarter carbs (no bigger than a computer mouse)  Cut down on sweet beverages. This includes juice, soda, and sweet tea.   Drink at least 1 glass of water with each meal and aim for at least 8 glasses per day  Exercise at least 150 minutes every week.

## 2024-11-03 NOTE — Assessment & Plan Note (Signed)
 Management per neurology.  On Namenda  10 mg twice daily.

## 2024-11-03 NOTE — Progress Notes (Addendum)
" ° °  Trevor [name] is a 79 y.o. male who presents today for an office visit.  Assessment/Plan:  Chronic Problems Addressed Today: Subclinical hypothyroidism Had lengthy discussion with patient today regarding most recent labs which showed elevated TSH at 6.26 and borderline low free T4 at 0.60.  We discussed potential benefits of starting thyroid  supplementation for his subclinical hypothyroidism though did discuss with patient that he was right on the cutoff for full hypothyroidism and that he would likely have to start Synthroid  at some point in the near future.  He is agreeable to start Synthroid .  Will start 25 mcg daily and he will come back in 6 weeks to recheck.  Hopefully this will help some with cognitive function as well as energy levels.  Hyperlipidemia with target LDL less than 70 Doing well since restarting Crestor  5 mg 3 times weekly.  Recheck lipids in 3 to 6 months.  Dementia (HCC) Management per neurology.  On Namenda  10 mg twice daily.     Subjective:  HPI:  See assessment / plan for status of chronic conditions.      Discussed the use of AI scribe software for clinical note transcription with the patient, who gave verbal consent to proceed.  History of Present Illness Trevor [name] is a 79 year old male who presents for follow-up on thyroid  function.  He has been experiencing memory issues and decreased energy levels, which he wonders might be related to his thyroid  function.  He is currently taking Crestor  every other day for cholesterol management. He had previously stopped this medication a couple of years ago due to issues but has recently restarted it. He mentions experiencing a 'flow' issue previously with this medication.  No known family history of thyroid  issues.         Objective:  Physical Exam: BP 118/78 (BP Location: Right Arm, Patient Position: Sitting, Cuff Size: Normal)   Pulse 76   Temp 98 F (36.7 C) (Temporal)   Ht  5' 10 (1.778 m)   Wt 146 lb (66.2 kg)   SpO2 97%   BMI 20.95 kg/m   Gen: No acute distress, resting comfortably Neuro: Grossly normal, moves all extremities Psych: Normal affect and thought content      Kainen Struckman M. Kennyth, MD 11/27/2024 2:24 PM  "

## 2024-11-03 NOTE — Assessment & Plan Note (Addendum)
 Had lengthy discussion with patient today regarding most recent labs which showed elevated TSH at 6.26 and borderline low free T4 at 0.60.  We discussed potential benefits of starting thyroid  supplementation for his subclinical hypothyroidism though did discuss with patient that he was right on the cutoff for full hypothyroidism and that he would likely have to start Synthroid  at some point in the near future.  He is agreeable to start Synthroid .  Will start 25 mcg daily and he will come back in 6 weeks to recheck.  Hopefully this will help some with cognitive function as well as energy levels.

## 2024-11-03 NOTE — Assessment & Plan Note (Signed)
 Doing well since restarting Crestor  5 mg 3 times weekly.  Recheck lipids in 3 to 6 months.

## 2024-11-05 ENCOUNTER — Encounter: Payer: Self-pay | Admitting: Cardiovascular Disease

## 2024-11-09 ENCOUNTER — Ambulatory Visit: Admitting: Neurology

## 2024-11-09 ENCOUNTER — Encounter: Payer: Self-pay | Admitting: Neurology

## 2024-11-13 ENCOUNTER — Ambulatory Visit

## 2024-11-13 VITALS — Ht 62.0 in | Wt 123.0 lb

## 2024-11-13 DIAGNOSIS — M1712 Unilateral primary osteoarthritis, left knee: Secondary | ICD-10-CM

## 2024-11-13 DIAGNOSIS — M25562 Pain in left knee: Secondary | ICD-10-CM | POA: Diagnosis not present

## 2024-11-13 DIAGNOSIS — G8929 Other chronic pain: Secondary | ICD-10-CM

## 2024-11-13 MED ORDER — METHYLPREDNISOLONE ACETATE 40 MG/ML IJ SUSP
40.0000 mg | Freq: Once | INTRAMUSCULAR | Status: AC
  Administered 2024-11-13: 40 mg via INTRA_ARTICULAR

## 2024-11-13 NOTE — Progress Notes (Cosign Needed)
 [facility] A Department of [facility]   PCP: Kennyth Worth HERO, MD  CHIEF COMPLAINT: left knee pain  HPI: Patient is a pleasant 79 y.o. male who presents today requesting cortisone injection to his left knee.  Patient was last seen on 09/07/2024 by Dr. Cleatrice for management of bilateral knee osteoarthritis.  Patient has had 2 cortisone injections in the past that provided temporary relief.  He has tried limit any repeat injection as much as possible.  At last visit he was sent to physical therapy for improving quadricep strength and to help with terminal knee extension.  Patient says this has been helpful and he recently completed a marathon but says now his left knee has been bothering him significantly enough where he requests an injection.   PMH:  Past Medical History:  Diagnosis Date   Acute pericarditis    a. admitted as a code STEMI but no sig CAD on cath.    CAD (coronary artery disease)    30% stenosis mid RCA   Colonic polyp    Dr. Luis; hyperplastic polyp   Bertrum syndrome    Hyperlipidemia     Patient Active Problem List   Diagnosis Date Noted   Subclinical hypothyroidism 11/03/2024   Sleep-disordered breathing 02/20/2024   Dementia (HCC) 01/03/2024   Visual field defect 10/07/2023   Elevated PSA 10/07/2023   Vitamin D  deficiency 10/05/2022   Hyperglycemia 09/20/2020   Gastroesophageal reflux disease without esophagitis 09/03/2016   Exercise-induced asthma 03/14/2016   CAD (coronary artery disease)    Bertrum syndrome 09/06/2014   History of basal cell cancer 08/31/2013   Hyperlipidemia with target LDL less than 70 09/01/2007   BPH without obstruction/lower urinary tract symptoms 09/01/2007    PSurg:  Past Surgical History:  Procedure Laterality Date   actinic keratoses     facial removed x 2, Dr Helga   basal cell cancer  2013 & 2015   Dr Lorrayne Helga   CARDIAC CATHETERIZATION N/A 10/30/2015   Procedure:  Left Heart Cath and Coronary Angiography;  Surgeon: Lonni JONETTA Cash, MD;  Location: [facility] INVASIVE CV LAB;  Service: Cardiovascular;  Laterality: N/A;   COLONOSCOPY  2014   negative   COLONOSCOPY W/ POLYPECTOMY  2004   @ age 78, Dr Luis. ? Hyperplastic polyp   FLEXIBLE SIGMOIDOSCOPY  1999   negative   KNEE ARTHROSCOPY  1984   right    Allergies: Codeine, Famotidine , Rosuvastatin  calcium , and Niacin  Meds:  Previous Medications   ASCORBIC ACID  (VITA-C PO)    Take 1 tablet by mouth daily.   ASPIRIN  81 MG TABLET    Take 81 mg by mouth daily.     CHOLECALCIFEROL  (VITAMIN D ) 2000 UNITS CAPS    Take 2,000 Units by mouth daily.    FISH OIL-OMEGA-3 FATTY ACIDS  1000 MG CAPSULE    Take 1 g by mouth daily.    LEVOTHYROXINE  (SYNTHROID ) 25 MCG TABLET    Take 1 tablet (25 mcg total) by mouth daily.   MAGNESIUM  250 MG TABS    Take 250 mg by mouth daily.    MEMANTINE  (NAMENDA ) 10 MG TABLET    Take 1 tablet  twice a day   MULTIPLE VITAMINS-MINERALS (MENS 50+ MULTI VITAMIN/MIN PO)    Take 1 tablet daily by mouth.   ROSUVASTATIN  (CRESTOR ) 20 MG TABLET    Take one pill (5 mg)  three times weekly.   TURMERIC (QC TUMERIC COMPLEX PO)  Take by mouth.   ZINC 25 MG TABS    Take 1 tablet by mouth daily.    Social:  Social History   Tobacco Use   Smoking status: Former    Current packs/day: 0.00    Average packs/day: 1 pack/day for 13.0 years (13.0 ttl pk-yrs)    Types: Cigarettes    Start date: 10/29/1965    Quit date: 10/29/1978    Years since quitting: 46.0   Smokeless tobacco: Never   Tobacco comments:    smoked 1967-1980, up to 1 ppd  Substance Use Topics   Alcohol use: No    Alcohol/week: 0.0 standard drinks of alcohol    REVIEW OF SYSTEMS:  ROS negative except as noted in HPI above   Objective Exam:  Vitals:   11/13/24 1047  Weight: 123 lb (55.8 kg)  Height: 5' 2 (1.575 m)    GENERAL: Patient is afebrile, Vital signs reviewed, well appearing, Patient appears comfortable,  Alert and lucid. No apparent distress.   Physical Exam   Ortho Exam:  On inspection no evidence of erythema, ecchymoses present.  Mild/moderate knee effusion present in left knee.  Patient has lacks full extension of his left knee due to flexion contracture due to osteoarthritis.  Is able to have full range of motion otherwise.  Patient has 5/5 strength in bilateral lower extremities.  Patient is neurovascularly intact distally.  Ligamentous testing within normal limits.  Mild lateral joint line pain.  RESULTS:  Labs: No results found for this or any previous visit (from the past 48 hours).  Imaging:  No orders to display    Assessment/Plan:  1. Chronic pain of left knee   2. Primary osteoarthritis of left knee   - Patient requesting cortisone injection into the left knee after recently running a marathon and having flare from his known bilateral knee osteoarthritis.  Discussed pros and cons and patient wishes to proceed.  Completed cortisone intra-articular left knee injection today.  Patient tolerated well.  See procedure note below for full details.   - Patient states pain is manageable on his right and we will defer cortisone injection today.  I agree with decision to defer all course and recommendations as much as possible  - Continue rehab exercises and quad/hamstring strengthening as demonstrated on prior physical therapy appointments -Tylenol , Voltaren, ice/heat for pain - Follow-up as needed for additional management of bilateral knee osteoarthritis  PROCEDURE:  Risks & benefits of left knee cortisone injection reviewed. Consent obtained. Time-out completed. Patient prepped and draped in the normal fashion. Area cleansed with alcohol. Ethyl chloride spray used to anesthetize the skin. Solution of 4 mL 1% lidocaine  with 1 mL methylprednisolone  (Depo-medrol ) 40mg /mL injected into the Left knee using a 22-gauge 1.5-inch needle via the anterior lateral approach. Patient tolerated  procedure well without any complications. Area covered with adhesive bandage.  Post-procedure care reviewed, all questions answered.   New Prescriptions   No medications on file    Medications, medical history, allergies, surgical history, hospitalizations, family history, social history, ROS and vitals entered by nursing staff and reviewed by myself.  I discussed with the patient the diagnosis, treatment plan, indications for return to the emergency department, and for expected follow-up. The patient verbalized an understanding. The patient is asked if there are any questions or concerns. We discuss the case, until all issues are addressed to the patient's satisfaction.  Follow up per instructions including returning for additional office visit if symptoms worsen or proceeding to the  emergency department or urgent care in the next 12-24hrs if there is an acute concerning increasing symptoms, pain, fevers, or other symptoms.  Prentice Agent, DO  6:10 PM, 11/13/2024

## 2024-11-25 ENCOUNTER — Ambulatory Visit: Payer: Self-pay

## 2024-11-25 NOTE — Telephone Encounter (Signed)
 FYI Only or Action Required?: FYI only for provider: appointment scheduled on 1/29.  Patient was last seen in primary care on 11/03/2024 by Kennyth Worth HERO, MD.  Called Nurse Triage reporting Fatigue.  Symptoms began several months ago.  Interventions attempted: Rest, hydration, or home remedies.  Symptoms are: gradually worsening.  Triage Disposition: See PCP When Office is Open (Within 3 Days)  Patient/caregiver understands and will follow disposition?: Yes  Message from Alexandria E sent at 11/25/2024  2:48 PM EST  Summary: Worsening brain fog   Reason for Triage: Worsening brain fog, is slow to think, and easily fatigued. Patient has a spot on back of head that is bothering him.         Reason for Disposition  [1] Weakness of arm / hand, or leg / foot AND [2] is a chronic symptom (recurrent or ongoing AND present > 4 weeks)    Generalized brain fog/fatigue/weakness  Answer Assessment - Initial Assessment Questions Patient seen 1/6 for worsened fatigue and memory concerns. Started on Synthroid  and crestor , and memantine . Patient has stopped Crestor  (trialed many times with issues non specified as they can't remember clearly). Also stopped Synthroid - wife reports wasn't acting right.  Denies, AMS, CP, SOB, Fever, Dizziness, vision changes, speech changes, balance issues, or unilateral weakness.   Feels like something in the back left of this head. Denies outside on scalp or boney prominence. Unable to describe- just bothering him. Not painful just aware  Medication management appt made for Friday- unable to get out of driveway currently. Understands ED precautions   1. SYMPTOM: What is the main symptom you are concerned about? (e.g., weakness, numbness)     Brain fog, fatigue 2. ONSET: When did this start? (e.g., minutes, hours, days; while sleeping)     Few weeks  3. LAST NORMAL: When was the last time you (the patient) were normal (no symptoms)?     2 years 4.  PATTERN Does this come and go, or has it been constant since it started?  Is it present now?     Worsened over last few weeks  5. CARDIAC SYMPTOMS: Have you had any of the following symptoms: chest pain, difficulty breathing, palpitations?     denies 6. NEUROLOGIC SYMPTOMS: Have you had any of the following symptoms: headache, dizziness, vision loss, double vision, changes in speech, unsteady on your feet?     Head feels odd on the inside left posterior head. 7. OTHER SYMPTOMS: Do you have any other symptoms?     denies  Protocols used: Neurologic Deficit-A-AH

## 2024-11-25 NOTE — Telephone Encounter (Signed)
 Noted, patient has an OV with PCP on 11/27/2024

## 2024-11-27 ENCOUNTER — Ambulatory Visit: Admitting: Family Medicine

## 2024-11-27 VITALS — BP 110/60 | HR 73 | Temp 97.7°F

## 2024-11-27 DIAGNOSIS — E038 Other specified hypothyroidism: Secondary | ICD-10-CM

## 2024-11-27 DIAGNOSIS — G309 Alzheimer's disease, unspecified: Secondary | ICD-10-CM | POA: Diagnosis not present

## 2024-11-27 DIAGNOSIS — F0280 Dementia in other diseases classified elsewhere without behavioral disturbance: Secondary | ICD-10-CM | POA: Diagnosis not present

## 2024-11-27 DIAGNOSIS — E785 Hyperlipidemia, unspecified: Secondary | ICD-10-CM | POA: Diagnosis not present

## 2024-11-27 LAB — CBC
HCT: 38 % — ABNORMAL LOW (ref 39.0–52.0)
Hemoglobin: 12.7 g/dL — ABNORMAL LOW (ref 13.0–17.0)
MCHC: 33.5 g/dL (ref 30.0–36.0)
MCV: 92.6 fl (ref 78.0–100.0)
Platelets: 190 10*3/uL (ref 150.0–400.0)
RBC: 4.1 Mil/uL — ABNORMAL LOW (ref 4.22–5.81)
RDW: 13.8 % (ref 11.5–15.5)
WBC: 6.6 10*3/uL (ref 4.0–10.5)

## 2024-11-27 LAB — COMPREHENSIVE METABOLIC PANEL WITH GFR
ALT: 16 U/L (ref 3–53)
AST: 24 U/L (ref 5–37)
Albumin: 4.2 g/dL (ref 3.5–5.2)
Alkaline Phosphatase: 56 U/L (ref 39–117)
BUN: 24 mg/dL — ABNORMAL HIGH (ref 6–23)
CO2: 28 meq/L (ref 19–32)
Calcium: 8.8 mg/dL (ref 8.4–10.5)
Chloride: 102 meq/L (ref 96–112)
Creatinine, Ser: 1.15 mg/dL (ref 0.40–1.50)
GFR: 61.67 mL/min
Glucose, Bld: 96 mg/dL (ref 70–99)
Potassium: 4.8 meq/L (ref 3.5–5.1)
Sodium: 138 meq/L (ref 135–145)
Total Bilirubin: 1.3 mg/dL — ABNORMAL HIGH (ref 0.2–1.2)
Total Protein: 6.6 g/dL (ref 6.0–8.3)

## 2024-11-27 LAB — TSH: TSH: 5.02 u[IU]/mL (ref 0.35–5.50)

## 2024-11-27 LAB — T3, FREE: T3, Free: 3.3 pg/mL (ref 2.3–4.2)

## 2024-11-27 LAB — T4, FREE: Free T4: 0.56 ng/dL — ABNORMAL LOW (ref 0.60–1.60)

## 2024-11-27 NOTE — Assessment & Plan Note (Signed)
 He stopped the Crestor  due to concern for potential side effects including lethargy, fatigue, and brain fog.  He will stay off this for now and we can recheck lipids in a few months.

## 2024-11-27 NOTE — Assessment & Plan Note (Signed)
 I saw patient a few weeks ago for his recent labs which were notable for TSH of 6.26 and low free T4 at 0.60.  He was agreeable to start Synthroid  at that time.  He did this for a few days however unfortunately developed significant lethargy afterwards.  History is somewhat difficult due to his history of dementia however it sounds like he has been off the thyroid  medication for several weeks at this point and still having ongoing issues with brain fog.  Will recheck labs today.  Will consider referral to endocrinologist if he cannot tolerate Synthroid  though we did discuss importance of managing his potential hypothyroidism to see if this will help with his cognitive impairment related to his dementia.

## 2024-11-27 NOTE — Progress Notes (Signed)
" ° °  Trevor [name] is a 79 y.o. male who presents today for an office visit.  Assessment/Plan:   Chronic Problems Addressed Today: Subclinical hypothyroidism I saw patient a few weeks ago for his recent labs which were notable for TSH of 6.26 and low free T4 at 0.60.  He was agreeable to start Synthroid  at that time.  He did this for a few days however unfortunately developed significant lethargy afterwards.  History is somewhat difficult due to his history of dementia however it sounds like he has been off the thyroid  medication for several weeks at this point and still having ongoing issues with brain fog.  Will recheck labs today.  Will consider referral to endocrinologist if he cannot tolerate Synthroid  though we did discuss importance of managing his potential hypothyroidism to see if this will help with his cognitive impairment related to his dementia.  Hyperlipidemia with target LDL less than 70 He stopped the Crestor  due to concern for potential side effects including lethargy, fatigue, and brain fog.  He will stay off this for now and we can recheck lipids in a few months.  Dementia [facility]) Follows with neurology for this and is on memantine  10 mg twice daily.  We are working on his subclinical hypothyroidism as above which is contributing as well.  Will also place referral to home health.  We did this several months ago but apparently only had 1 visit before services were discontinued.     Subjective:  HPI:  See assessment / plan for status of chronic conditions.      Discussed the use of AI scribe software for clinical note transcription with the patient, who gave verbal consent to proceed.  History of Present Illness Trevor [name] is a 79 year old male who presents with brain fog and pressure in the head.  He has been experiencing a sensation of 'fog' for the past month and a half, which has recently increased. He describes being able to see clearly but feels  something is off. He also mentions feeling pressure in the back of his head, although it does not cause pain. No headaches or vision changes.  He has a history of hypothyroidism and was started on levothyroxine , but experienced weakness after a few days of use. He stopped taking it about a week ago. He was also taking rosuvastatin  but had a major reaction, including brain fog and low energy. He discontinued it a few days ago. Currently, he is only taking memantine  at night.  He has been experiencing hallucinations, which have improved with memantine , although he still has some episodes. His caregiver notes that he talks in his sleep and has had hallucinations at night.  He has a history of adverse reactions to medications, stating 'I don't do good with drugs really.' His caregiver reports that he has difficulty reading and she is feeling overwhelmed with caregiving responsibilities.         Objective:  Physical Exam: BP 110/60   Pulse 73   Temp 97.7 F (36.5 C) (Temporal)   SpO2 98%   Gen: No acute distress, resting comfortably CV: Regular rate and rhythm with no murmurs appreciated Pulm: Normal work of breathing, clear to auscultation bilaterally with no crackles, wheezes, or rhonchi Neuro: Grossly normal, moves all extremities Psych: Normal affect and thought content      Charlize Hathaway M. Kennyth, MD 11/27/2024 2:38 PM  "

## 2024-11-27 NOTE — Assessment & Plan Note (Signed)
 Follows with neurology for this and is on memantine  10 mg twice daily.  We are working on his subclinical hypothyroidism as above which is contributing as well.  Will also place referral to home health.  We did this several months ago but apparently only had 1 visit before services were discontinued.

## 2024-11-30 ENCOUNTER — Ambulatory Visit: Payer: Self-pay | Admitting: Family Medicine

## 2025-01-07 ENCOUNTER — Ambulatory Visit

## 2025-03-30 ENCOUNTER — Ambulatory Visit: Admitting: Physician Assistant

## 2025-10-14 ENCOUNTER — Encounter: Admitting: Family Medicine
